# Patient Record
Sex: Female | Born: 1935 | Race: White | Hispanic: No | Smoking: Never smoker
Health system: Southern US, Community
[De-identification: ages and names within clinical notes are randomized; demographics above are authoritative.]

## PROBLEM LIST (undated history)

## (undated) DIAGNOSIS — I4891 Unspecified atrial fibrillation: Secondary | ICD-10-CM

## (undated) DIAGNOSIS — C44301 Unspecified malignant neoplasm of skin of nose: Secondary | ICD-10-CM

## (undated) DIAGNOSIS — H3530 Unspecified macular degeneration: Secondary | ICD-10-CM

## (undated) DIAGNOSIS — L309 Dermatitis, unspecified: Secondary | ICD-10-CM

## (undated) DIAGNOSIS — R609 Edema, unspecified: Secondary | ICD-10-CM

## (undated) DIAGNOSIS — E1129 Type 2 diabetes mellitus with other diabetic kidney complication: Secondary | ICD-10-CM

## (undated) DIAGNOSIS — N183 Chronic kidney disease, stage 3 unspecified: Secondary | ICD-10-CM

## (undated) DIAGNOSIS — I509 Heart failure, unspecified: Secondary | ICD-10-CM

## (undated) DIAGNOSIS — E507 Other ocular manifestations of vitamin A deficiency: Secondary | ICD-10-CM

## (undated) DIAGNOSIS — I10 Essential (primary) hypertension: Secondary | ICD-10-CM

## (undated) DIAGNOSIS — I495 Sick sinus syndrome: Secondary | ICD-10-CM

## (undated) DIAGNOSIS — E785 Hyperlipidemia, unspecified: Secondary | ICD-10-CM

## (undated) DIAGNOSIS — R682 Dry mouth, unspecified: Secondary | ICD-10-CM

## (undated) DIAGNOSIS — G319 Degenerative disease of nervous system, unspecified: Secondary | ICD-10-CM

## (undated) HISTORY — DX: Other ocular manifestations of vitamin A deficiency: E50.7

## (undated) HISTORY — DX: Essential (primary) hypertension: I10

## (undated) HISTORY — DX: Hyperlipidemia, unspecified: E78.5

## (undated) HISTORY — DX: Sick sinus syndrome: I49.5

## (undated) HISTORY — DX: Dry mouth, unspecified: R68.2

## (undated) HISTORY — DX: Unspecified macular degeneration: H35.30

## (undated) HISTORY — DX: Type 2 diabetes mellitus with other diabetic kidney complication: E11.29

## (undated) HISTORY — DX: Heart failure, unspecified: I50.9

## (undated) HISTORY — PX: EYE SURGERY: SHX253

## (undated) HISTORY — PX: PACEMAKER INSERTION: SHX728

## (undated) HISTORY — DX: Chronic kidney disease, stage 3 (moderate): N18.3

## (undated) HISTORY — DX: Edema, unspecified: R60.9

## (undated) HISTORY — DX: Dermatitis, unspecified: L30.9

## (undated) HISTORY — DX: Unspecified malignant neoplasm of skin of nose: C44.301

## (undated) HISTORY — DX: Unspecified atrial fibrillation: I48.91

## (undated) HISTORY — DX: Chronic kidney disease, stage 3 unspecified: N18.30

## (undated) HISTORY — DX: Degenerative disease of nervous system, unspecified: G31.9

---

## 1988-12-03 HISTORY — PX: KNEE SURGERY: SHX244

## 1988-12-03 HISTORY — PX: ANKLE SURGERY: SHX546

## 1998-09-27 ENCOUNTER — Other Ambulatory Visit: Admission: RE | Admit: 1998-09-27 | Discharge: 1998-09-27 | Payer: Self-pay | Admitting: *Deleted

## 2000-07-09 ENCOUNTER — Encounter: Admission: RE | Admit: 2000-07-09 | Discharge: 2000-07-09 | Payer: Self-pay | Admitting: *Deleted

## 2000-07-09 ENCOUNTER — Encounter: Payer: Self-pay | Admitting: *Deleted

## 2000-07-15 ENCOUNTER — Other Ambulatory Visit: Admission: RE | Admit: 2000-07-15 | Discharge: 2000-07-15 | Payer: Self-pay | Admitting: *Deleted

## 2000-08-07 ENCOUNTER — Ambulatory Visit (HOSPITAL_COMMUNITY): Admission: RE | Admit: 2000-08-07 | Discharge: 2000-08-07 | Payer: Self-pay | Admitting: *Deleted

## 2000-10-31 ENCOUNTER — Ambulatory Visit (HOSPITAL_COMMUNITY): Admission: RE | Admit: 2000-10-31 | Discharge: 2000-10-31 | Payer: Self-pay | Admitting: Gastroenterology

## 2003-12-04 HISTORY — PX: CARDIAC VALVE SURGERY: SHX40

## 2004-08-10 ENCOUNTER — Inpatient Hospital Stay (HOSPITAL_COMMUNITY): Admission: RE | Admit: 2004-08-10 | Discharge: 2004-08-25 | Payer: Self-pay | Admitting: Cardiology

## 2004-08-10 ENCOUNTER — Encounter (INDEPENDENT_AMBULATORY_CARE_PROVIDER_SITE_OTHER): Payer: Self-pay | Admitting: *Deleted

## 2004-08-10 ENCOUNTER — Encounter: Payer: Self-pay | Admitting: Cardiology

## 2004-08-25 ENCOUNTER — Emergency Department (HOSPITAL_COMMUNITY): Admission: EM | Admit: 2004-08-25 | Discharge: 2004-08-25 | Payer: Self-pay | Admitting: Emergency Medicine

## 2004-09-25 ENCOUNTER — Encounter
Admission: RE | Admit: 2004-09-25 | Discharge: 2004-09-25 | Payer: Self-pay | Admitting: Thoracic Surgery (Cardiothoracic Vascular Surgery)

## 2005-08-21 IMAGING — CR DG CHEST 2V
2 series · 2 of 2 positions shown · non-contrast
Comparison: none

CLINICAL DATA: CABG, follow-up. 
 TWO VIEW CHEST
 Comparison 08/17/04
 The patient is again seen to be status-post median sternotomy and aortic valve replacement. There are small bilateral effusions with lower lobe atelectasis. The upper lobes are clear. No gross pulmonary edema.  
 IMPRESSION 
 Postoperative effusions and lower lobe atelectasis, more on the left than on the right.  Allowing for technical factors, probably little change since yesterday.

[view not recorded (1 of 2)]
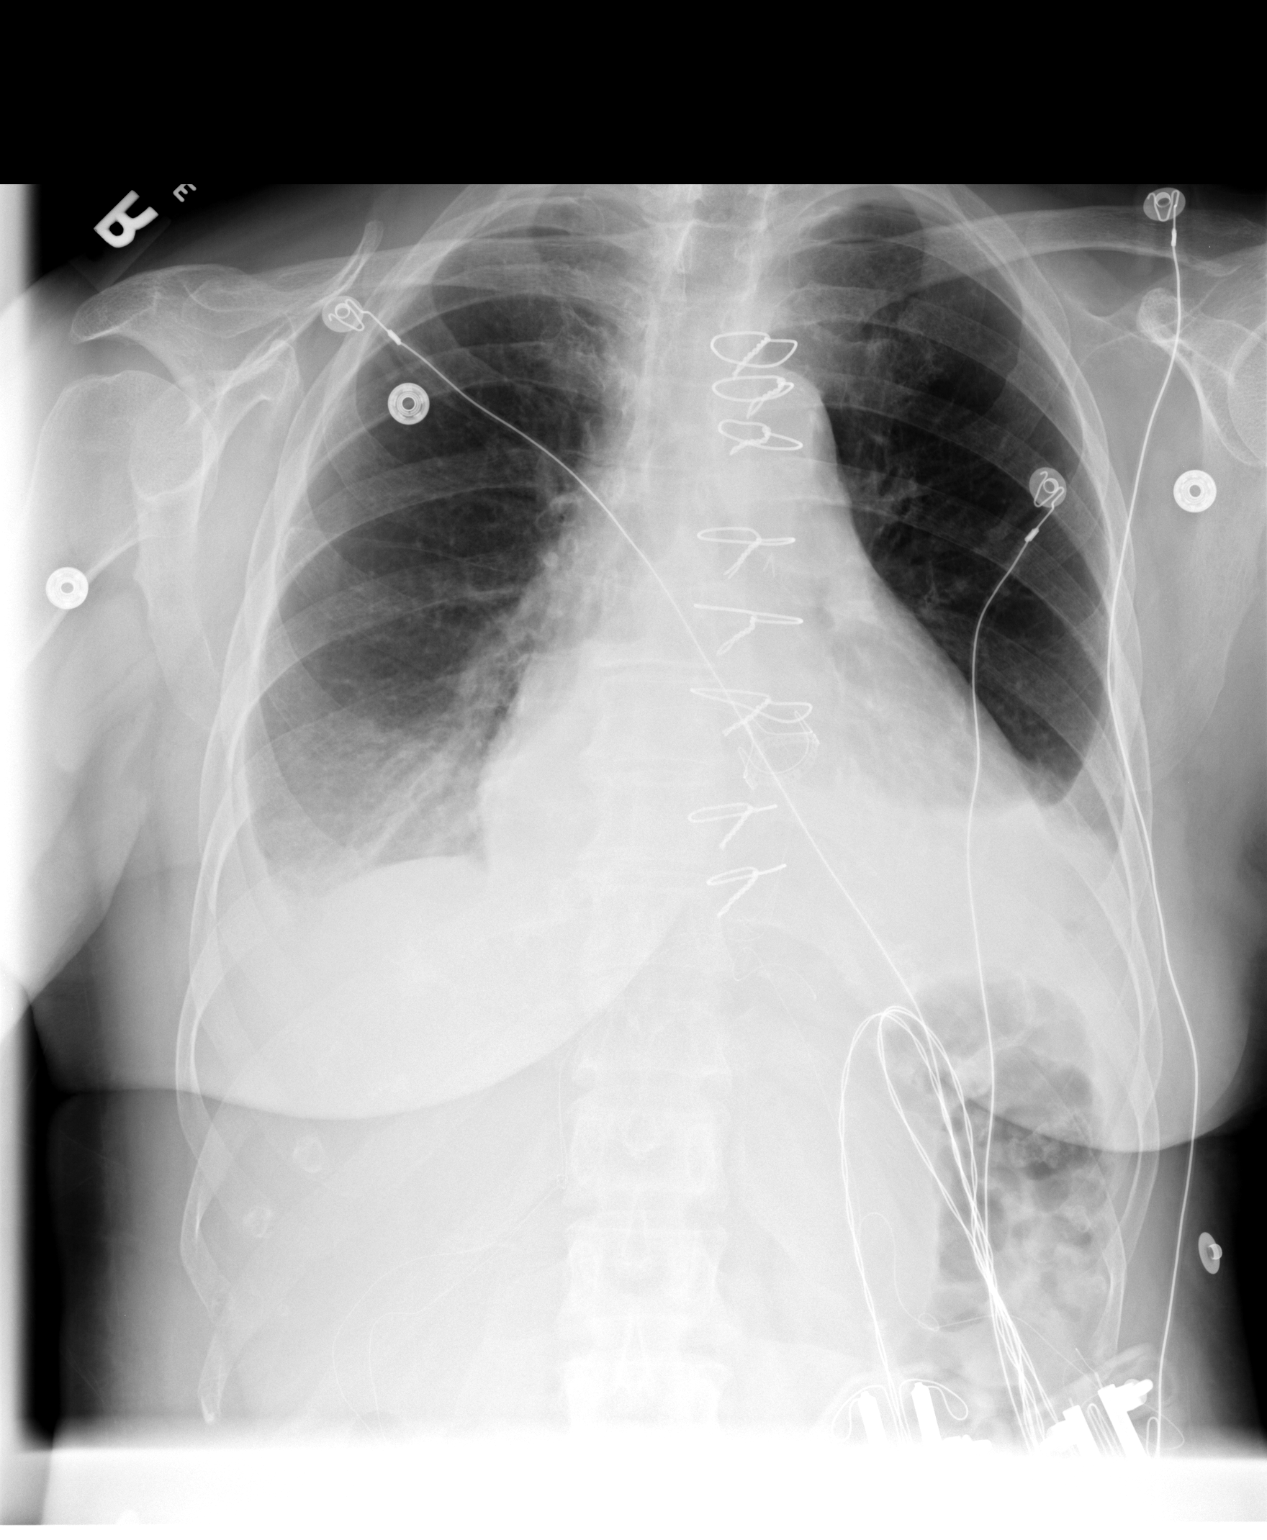

[view not recorded (2 of 2)]
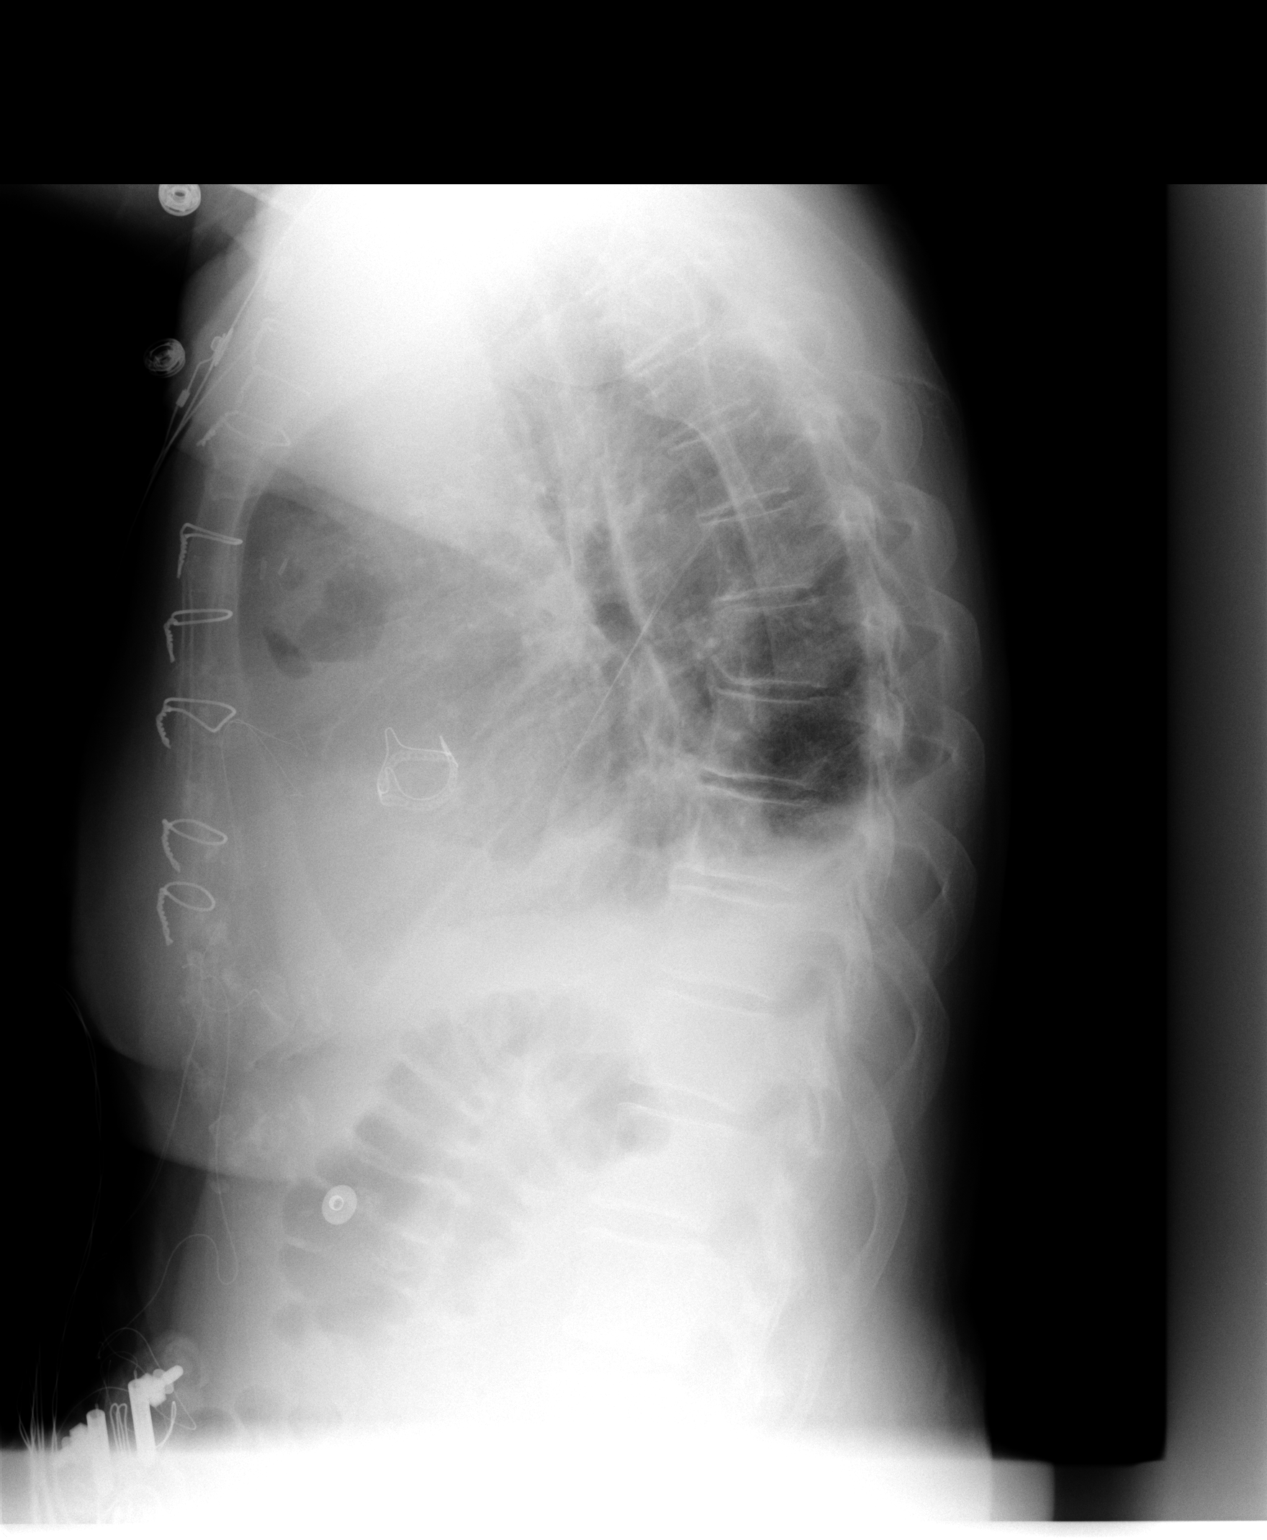

[2 of 2 positions shown; findings below may reference images not displayed]

## 2006-12-03 HISTORY — PX: COLONOSCOPY: SHX174

## 2007-12-04 ENCOUNTER — Emergency Department (HOSPITAL_COMMUNITY): Admission: EM | Admit: 2007-12-04 | Discharge: 2007-12-04 | Payer: Self-pay | Admitting: Emergency Medicine

## 2007-12-04 DIAGNOSIS — G319 Degenerative disease of nervous system, unspecified: Secondary | ICD-10-CM

## 2007-12-04 HISTORY — DX: Degenerative disease of nervous system, unspecified: G31.9

## 2009-09-16 ENCOUNTER — Encounter: Admission: RE | Admit: 2009-09-16 | Discharge: 2009-09-16 | Payer: Self-pay | Admitting: Internal Medicine

## 2010-06-12 ENCOUNTER — Encounter: Payer: Self-pay | Admitting: Internal Medicine

## 2010-07-10 ENCOUNTER — Ambulatory Visit: Payer: Self-pay | Admitting: Cardiovascular Disease

## 2010-07-24 ENCOUNTER — Ambulatory Visit: Payer: Self-pay | Admitting: Cardiology

## 2010-08-08 ENCOUNTER — Ambulatory Visit: Payer: Self-pay | Admitting: Cardiology

## 2010-09-05 ENCOUNTER — Ambulatory Visit: Payer: Self-pay | Admitting: Cardiology

## 2010-09-23 ENCOUNTER — Encounter: Payer: Self-pay | Admitting: Internal Medicine

## 2010-10-03 ENCOUNTER — Ambulatory Visit: Payer: Self-pay | Admitting: Cardiology

## 2010-11-01 ENCOUNTER — Ambulatory Visit: Payer: Self-pay | Admitting: Internal Medicine

## 2010-11-01 ENCOUNTER — Ambulatory Visit: Payer: Self-pay | Admitting: Cardiology

## 2010-11-01 DIAGNOSIS — I4891 Unspecified atrial fibrillation: Secondary | ICD-10-CM

## 2010-11-01 DIAGNOSIS — I308 Other forms of acute pericarditis: Secondary | ICD-10-CM

## 2010-11-01 DIAGNOSIS — I10 Essential (primary) hypertension: Secondary | ICD-10-CM

## 2010-11-01 DIAGNOSIS — I359 Nonrheumatic aortic valve disorder, unspecified: Secondary | ICD-10-CM | POA: Insufficient documentation

## 2010-11-01 DIAGNOSIS — I498 Other specified cardiac arrhythmias: Secondary | ICD-10-CM

## 2010-11-08 ENCOUNTER — Ambulatory Visit: Payer: Self-pay | Admitting: Cardiology

## 2010-12-03 DIAGNOSIS — C44301 Unspecified malignant neoplasm of skin of nose: Secondary | ICD-10-CM

## 2010-12-03 DIAGNOSIS — E507 Other ocular manifestations of vitamin A deficiency: Secondary | ICD-10-CM

## 2010-12-03 DIAGNOSIS — K117 Disturbances of salivary secretion: Secondary | ICD-10-CM

## 2010-12-03 DIAGNOSIS — H3530 Unspecified macular degeneration: Secondary | ICD-10-CM

## 2010-12-03 HISTORY — DX: Unspecified macular degeneration: H35.30

## 2010-12-03 HISTORY — DX: Disturbances of salivary secretion: K11.7

## 2010-12-03 HISTORY — DX: Other ocular manifestations of vitamin A deficiency: E50.7

## 2010-12-03 HISTORY — DX: Unspecified malignant neoplasm of skin of nose: C44.301

## 2010-12-14 ENCOUNTER — Ambulatory Visit: Payer: Self-pay | Admitting: Cardiology

## 2011-01-02 NOTE — Cardiovascular Report (Signed)
Summary: Office Visit   Office Visit   Imported By: Roderic Ovens 11/07/2010 10:50:06  _____________________________________________________________________  External Attachment:    Type:   Image     Comment:   External Document

## 2011-01-02 NOTE — Assessment & Plan Note (Signed)
Summary: pacer check/medtronic   Visit Type:  Follow-up   History of Present Illness: Nancy Payne is a pleasant 77 yo WF with a prior aortic valve replacement, atypical atrial flutter and atrial fibrillation, and bradycardia s/p PPM implant who presents today to establish EP care.  She has done well recently. She remains active and is doing well.  She denies symptoms of palpitations, chest pain, shortness of breath, orthopnea, PND, lower extremity edema, dizziness, presyncope, syncope, or neurologic sequela. The patient is tolerating medications without difficulties and is otherwise without complaint today.   Current Medications (verified): 1)  Metoprolol Tartrate 25 Mg Tabs (Metoprolol Tartrate) .... Take One Tablet By Mouth Twice A Day 2)  Aspirin 81 Mg Tbec (Aspirin) .... Take One Tablet By Mouth Daily 3)  Amaryl 2 Mg Tabs (Glimepiride) .... Once Daily 4)  Januvia 100 Mg Tabs (Sitagliptin Phosphate) .... 1/2 Tab Daily 5)  Warfarin Sodium 2.5 Mg Tabs (Warfarin Sodium) .... Use As Directed By Anticoagualtion Clinic 6)  Diovan Hct 320-12.5 Mg Tabs (Valsartan-Hydrochlorothiazide) .... Once Daily 7)  Lipitor 80 Mg Tabs (Atorvastatin Calcium) .... Take One Tablet By Mouth Daily.  Allergies (verified): 1)  ! * Amiordarone  Past History:  Past Medical History: persistent atrial fibrillation and atrial flutter s/p aortic valve replacement (tissue valve) bradycardia s/p PPM (MDT) HTN diabetes spinal stenosis HL    Past Surgical History: aortic valve replacement (tissue) pacemaker implant ORIF R leg  Family History: pt unaware of significant FH  Social History: Lives in [place] denies tobacco, ETOh, Drugs  Review of Systems       All systems are reviewed and negative except as listed in the HPI.   Vital Signs:  Patient profile:   77 year old female Height:      63 inches Weight:      141 pounds BMI:     25.07 Pulse rate:   91 / minute BP sitting:   120 / 80  (left  arm)  Vitals Entered By: Laurance Flatten CMA (November 01, 2010 10:44 AM)  Physical Exam  General:  Well developed, well nourished, in no acute distress. Head:  normocephalic and atraumatic Eyes:  PERRLA/EOM intact; conjunctiva and lids normal. Mouth:  Teeth, gums and palate normal. Oral mucosa normal. Neck:  supple Chest Wall:  L sided pacemaker pocket is well healed Lungs:  Clear bilaterally to auscultation and percussion. Heart:  iRRR Abdomen:  Bowel sounds positive; abdomen soft and non-tender without masses, organomegaly, or hernias noted. No hepatosplenomegaly. Msk:  Back normal, normal gait. Muscle strength and tone normal. Pulses:  pulses normal in all 4 extremities Extremities:  No clubbing or cyanosis. Neurologic:  Alert and oriented x 3.   PPM Specifications Following MD:  Hillis Range, MD     Referring MD:  Vonna Drafts PPM Vendor:  Medtronic     PPM Model Number:  [id number]     PPM Serial Number:  [id number] PPM DOI:  08/22/2004     PPM Implanting MD:  Charlynn Court  Lead 1    Location: RA     DOI: 08/22/2004     Model #: [id number]     Serial #: [id number]     Status: active Lead 2    Location: RV     DOI: 08/22/2004     Model #: [id number]     Serial #: [id number]     Status: active  Magnet Response Rate:  BOL 85 ERI 65  Indications:  Sick sinus syndrome  PPM Follow Up Battery Voltage:  2.97 V       PPM Device Measurements Atrium  Amplitude: 2.4 mV, Impedance: 528 ohms,  Right Ventricle  Amplitude: 6.5 mV, Impedance: 440 ohms, Threshold: 1.0 V at 0.4 msec  Episodes Nancy Episodes:  22     Percent Mode Switch:  100%     Coumadin:  Yes Ventricular High Rate:  5      Parameters Mode:  MVP     Lower Rate Limit:  60     Upper Rate Limit:  130 Paced AV Delay:  180     Sensed AV Delay:  150 Next Remote Date:  02/01/2011     Next Cardiology Appt Due:  10/04/2011 Tech Comments:  [facility]---PT IN AF 100% OF TIME. + COUMADIN.  EPISODES OF 1:1 SVT AT RATES 180-190 2 AND 1/2  MINUTGES MAX DURATION.  NORMAL DEVICE FUNCTION.  CHANGED RV OUTPUT FROM 2.0 TO 2.5 V. PT TO BE ENROLLED IN CARELINK.  CARELINK 02-01-11 AND ROV IN 12 MTHS W/JA. Vella Kohler  November 01, 2010 10:43 AM MD Comments:  agree  Impression & Recommendations:  Problem # 1:  ATRIAL FIBRILLATION (ICD-427.31) the patient has a history of afib and atrial flutter for which she is appropriately anticoagulated with coumadin. Therapeutic strategies for afib and atrial flutter including medicine and ablation were discussed in detail with the patient today. At this time, she has no interest in ablation and would prefer rate control.  I think that this is appropriate given her advanced age. I have increased metoprolol to 50mg  two times a day today for better rate control. Continue coumadin longterm.  Problem # 2:  ESSENTIAL HYPERTENSION, BENIGN (ICD-401.1) stable  Problem # 3:  BRADYCARDIA (ICD-427.89) normal pacemaker function no changes today  Patient Instructions: 1)  Your physician wants you to follow-up in:12 months with Dr Johney Frame   Nancy Payne will receive a reminder letter in the mail two months in advance. If you don't receive a letter, please call our office to schedule the follow-up appointment. 2)  [facility] transmission 02/01/2011 3)  Your physician has recommended you make the following change in your medication: increase Metoprolol to 50mg  two times a day Prescriptions: METOPROLOL TARTRATE 50 MG TABS (METOPROLOL TARTRATE) one by mouth bid  #60 x 11   Entered by:   Dennis Bast, RN, BSN   Authorized by:   Hillis Range, MD   Signed by:   Dennis Bast, RN, BSN on 11/01/2010   Method used:   Electronically to        [facility]. [phone]* (retail)       [address]       [place], [place]  [address]       Ph: [phone]       Fax: [phone]   RxID:   [id number]

## 2011-01-02 NOTE — Letter (Signed)
Summary: [facility]  [facility]   Imported By: Marylou Mccoy 11/07/2010 16:11:47  _____________________________________________________________________  External Attachment:    Type:   Image     Comment:   External Document

## 2011-01-02 NOTE — Miscellaneous (Signed)
Summary: Device preload  Clinical Lists Changes  Observations: Added new observation of PPM INDICATN: Sick sinus syndrome (09/23/2010 17:16) Added new observation of MAGNET RTE: BOL 85 ERI 65 (09/23/2010 17:16) Added new observation of PPMLEADSTAT2: active (09/23/2010 17:16) Added new observation of PPMLEADSER2: [id number] (09/23/2010 17:16) Added new observation of PPMLEADMOD2: 5076  (09/23/2010 17:16) Added new observation of PPMLEADDOI2: 08/22/2004  (09/23/2010 17:16) Added new observation of PPMLEADLOC2: RV  (09/23/2010 17:16) Added new observation of PPMLEADSTAT1: active  (09/23/2010 17:16) Added new observation of PPMLEADSER1: [id number]  (09/23/2010 17:16) Added new observation of PPMLEADMOD1: 5076  (09/23/2010 17:16) Added new observation of PPMLEADDOI1: 08/22/2004  (09/23/2010 17:16) Added new observation of PPMLEADLOC1: RA  (09/23/2010 17:16) Added new observation of PPM DOI: 08/22/2004  (09/23/2010 17:16) Added new observation of PPM SERL#: [id number]  (09/23/2010 17:16) Added new observation of PPM MODL#: [id number]  (09/23/2010 17:16) Added new observation of PACEMAKERMFG: Medtronic  (09/23/2010 17:16) Added new observation of PPM IMP MD: Charlynn Court  (09/23/2010 17:16) Added new observation of PPM REFER MD: Vonna Drafts  (09/23/2010 17:16) Added new observation of PACEMAKER MD: Hillis Range, MD  (09/23/2010 17:16)      PPM Specifications Following MD:  Hillis Range, MD     Referring MD:  Vonna Drafts PPM Vendor:  Medtronic     PPM Model Number:  [id number]     PPM Serial Number:  [id number] PPM DOI:  08/22/2004     PPM Implanting MD:  Charlynn Court  Lead 1    Location: RA     DOI: 08/22/2004     Model #: 1324     Serial #: [id number]     Status: active Lead 2    Location: RV     DOI: 08/22/2004     Model #: [id number]     Serial #: [id number]     Status: active  Magnet Response Rate:  BOL 85 ERI 65  Indications:  Sick sinus syndrome

## 2011-01-10 ENCOUNTER — Encounter (INDEPENDENT_AMBULATORY_CARE_PROVIDER_SITE_OTHER): Payer: Medicare Other

## 2011-01-10 DIAGNOSIS — Z7901 Long term (current) use of anticoagulants: Secondary | ICD-10-CM

## 2011-01-10 DIAGNOSIS — I4892 Unspecified atrial flutter: Secondary | ICD-10-CM

## 2011-01-24 ENCOUNTER — Other Ambulatory Visit (INDEPENDENT_AMBULATORY_CARE_PROVIDER_SITE_OTHER): Payer: Medicare Other

## 2011-01-24 DIAGNOSIS — I4891 Unspecified atrial fibrillation: Secondary | ICD-10-CM

## 2011-01-24 DIAGNOSIS — Z7901 Long term (current) use of anticoagulants: Secondary | ICD-10-CM

## 2011-02-01 ENCOUNTER — Encounter (INDEPENDENT_AMBULATORY_CARE_PROVIDER_SITE_OTHER): Payer: Medicare Other

## 2011-02-01 ENCOUNTER — Encounter: Payer: Self-pay | Admitting: Internal Medicine

## 2011-02-01 DIAGNOSIS — I495 Sick sinus syndrome: Secondary | ICD-10-CM

## 2011-02-08 ENCOUNTER — Encounter (INDEPENDENT_AMBULATORY_CARE_PROVIDER_SITE_OTHER): Payer: Medicare Other

## 2011-02-08 DIAGNOSIS — Z7901 Long term (current) use of anticoagulants: Secondary | ICD-10-CM

## 2011-02-08 DIAGNOSIS — I4891 Unspecified atrial fibrillation: Secondary | ICD-10-CM

## 2011-02-19 ENCOUNTER — Encounter: Payer: Self-pay | Admitting: *Deleted

## 2011-03-01 NOTE — Cardiovascular Report (Signed)
Summary: Office Visit   Office Visit   Imported By: Roderic Ovens 02/20/2011 14:30:32  _____________________________________________________________________  External Attachment:    Type:   Image     Comment:   External Document

## 2011-03-01 NOTE — Letter (Signed)
Summary: Remote Device Check  [facility], Main Office  [address]   [place], [place] [address]   Phone: [phone]  Fax: [phone]     February 19, 2011 MRN: [id number]   NEVADA KIRCHNER [address] [place], [place]  [address]   Dear Ms. Menger,   Your remote transmission was recieved and reviewed by your physician.  All diagnostics were within normal limits for you.  __X___Your next transmission is scheduled for: 05-03-2011.  Please transmit at any time this day.  If you have a wireless device your transmission will be sent automatically.   Sincerely,  Vella Kohler

## 2011-03-07 ENCOUNTER — Ambulatory Visit (INDEPENDENT_AMBULATORY_CARE_PROVIDER_SITE_OTHER): Payer: Medicare Other | Admitting: *Deleted

## 2011-03-07 DIAGNOSIS — I4891 Unspecified atrial fibrillation: Secondary | ICD-10-CM

## 2011-03-07 DIAGNOSIS — Z7901 Long term (current) use of anticoagulants: Secondary | ICD-10-CM

## 2011-04-04 ENCOUNTER — Ambulatory Visit (INDEPENDENT_AMBULATORY_CARE_PROVIDER_SITE_OTHER): Payer: Medicare Other | Admitting: *Deleted

## 2011-04-04 DIAGNOSIS — I4891 Unspecified atrial fibrillation: Secondary | ICD-10-CM

## 2011-04-16 ENCOUNTER — Encounter: Payer: Self-pay | Admitting: Family Medicine

## 2011-04-20 NOTE — H&P (Signed)
NAME:  Nancy Payne, Nancy Payne NO.:  [id number]   MEDICAL RECORD NO.:  [id number]                   PATIENT TYPE:  OIB   LOCATION:                                       FACILITY:  [facility]   PHYSICIAN:  Peter M. Swaziland, M.D.               DATE OF BIRTH:  08/17/1922   DATE OF ADMISSION:  08/10/2004  DATE OF DISCHARGE:                                HISTORY & PHYSICAL   Nancy Payne is a pleasant 77 year old white female who is being evaluated for  critical aortic stenosis.  The patient has had a long history of murmur and  had had echocardiograms in the past but the last one up until recently was  four years ago.  More recently, an echocardiogram was obtained, on May 11, 2004, and this demonstrated critical aortic stenosis with a peak  instantaneous gradient of 115-mmHg, mean gradient of 61-mmHg, and aortic  valve area of 0.3-cm2.  Left ventricular function was normal with moderate  concentric LVH.  She had moderate mitral __________  calcification and mild  mitral insufficiency.  The patient denies any cardiac symptoms, specifically  denied any chest pain, shortness of breath, palpitations, dizziness, or  syncope.  She has had no palpitations.  She has been very active and enjoys  walking and playing golf.  The patient had initially been seen in June but  her evaluation was delayed due to her husband undergoing emergency surgery  for abdominal aortic aneurysm.  He is now recovered, and she is willing to  proceed with her cardiac evaluation and treatment.   PAST MEDICAL HISTORY:  1.  Spinal stenosis.  2.  Hypercholesterolemia.  3.  Remote fracture of her right leg and has a rod in her lower leg.   PAST SURGICAL HISTORY:  1.  Tonsillectomy.  2.  Removal of an ovarian cyst.   ALLERGIES:  PENICILLIN.   CURRENT MEDICATIONS:  1.  Lipitor 20 mg per day.  2.  Aspirin 325 mg per day.  3.  Atenolol 100 mg daily.  4.  HCTZ 25 mg per day.   SOCIAL HISTORY:  The  patient is married.  She denies tobacco or alcohol use.  She has three children.   FAMILY HISTORY:  Father died at age 83 of unknown causes.  Mother died at  age 59 with a stroke.  One brother died with colon cancer and one died in a  motor vehicle accident.  One sister died with a brain aneurysm.  One brother  is alive and well.   REVIEW OF SYSTEMS:  Otherwise negative.   PHYSICAL EXAMINATION:  GENERAL:  The patient is a pleasant white female in  no distress.  VITAL SIGNS:  Weight is 133, blood pressure 160/100, pulse 80 and regular.  Her respirations are 18 and normal.  HEENT:  Pupils equal, round, reactive to light and accommodation.  Extraocular movements are  full.  Oropharynx is clear.  NECK:  Without JVD, adenopathy, or thyromegaly.  Carotid upstrokes are  markedly diminished and delayed with radiated murmur.  LUNGS:  Clear.  CARDIAC:  Reveals a harsh grade 3/6 systolic murmur heard in the aortic area  radiating to the carotids and to the apex.  S2 is muffled.  There is no  thrill.  There is no diastolic murmur.  ABDOMEN:  Soft and nontender without masses or bruits.  EXTREMITIES:  Without edema.  Pulses are 2+ and symmetric.  NEUROLOGIC:  Nonfocal.   LABORATORY DATA:  ECG shows a normal sinus rhythm.  There is LVH with  repolarization abnormality.   Chest x-ray shows some cardiomegaly, otherwise no active disease.   Coags are normal.  Sodium 142, potassium 4.2, chloride 102, CO2 33, BUN 21,  creatinine 1.1, glucose of 116.  Liver function studies are normal.  Cholesterol is 170, triglycerides 88, HDL 63, LDL of 89.  CBC is normal.   IMPRESSION:  1.  Critical aortic stenosis.  2.  Hypertension.  3.  History of hypercholesterolemia.  4.  History of spinal stenosis.   PLAN:  We will proceed with right and left heart catheterization coronary  angiography.  Once her diagnostic evaluation is complete, we will refer to  Dr. Tyrone Sage for consideration of aortic valve  replacement.                                                Peter M. Swaziland, M.D.    PMJ/MEDQ  D:  08/04/2004  T:  08/04/2004  Job:  [id number]   cc:   Gwenith Daily. Tyrone Sage, M.D.  [address]  [place]  [place] [address]   Wilson Singer, M.D.  [phone] [address]  [place]  [place] [address]  Fax: [phone]

## 2011-04-20 NOTE — Cardiovascular Report (Signed)
Nancy Payne, GILLHAM NO.:  [id number]   MEDICAL RECORD NO.:  [id number]          PATIENT TYPE:  INP   LOCATION:  2034                         FACILITY:  [facility]   PHYSICIAN:  Elmore Guise., M.D.DATE OF BIRTH:  Apr 07, 1922   DATE OF PROCEDURE:  08/22/2004  DATE OF DISCHARGE:                              CARDIAC CATHETERIZATION   PROCEDURE:  Dual chamber permanent pacemaker placement.   INDICATIONS:  Significant sinus pause, sick sinus syndrome.   PROCEDURE:  Patient came to the cardiac catheterization laboratory after  appropriate informed consent.  She was prepped and draped in sterile  fashion.  Approximately a 2 inch incision was made at the left delta  pectoral groove.  A subcutaneous pocket was made without difficulty.  Two  venous access sticks into the left axillary/left subclavian vein were  performed under fluoroscopic guidance.  Ventricular lead was placed without  difficulty.  The Medtronic active fixation 5076/52 cm lead, serial  #[id number], was placed in the ventricle with the following thresholds.  Threshold was 0.7 volts with a pulse width of 0.5 milliseconds.  Current was  1.1 milliamps, impedance of 854 ohms with R-waves measuring 12.4 millivolts.  An atrial lead was then placed a Medtronic 5076/45 cm serial #[id number]  active fixation lead was placed in the atrium with atrial fibrillation waves  of 2.0 and an impedance of 663 ohms.  An EnRhythm dual chamber pacemaker was  then attached to the atrial and ventricular leads.  Pacemaker is [id number],  serial #[id number].  Pocket was irrigated with kanamycin solution.  Pacemaker was sewn into pocket.  Pocket was closed with three layers of  continuous sutures with the bottom layer being a 2-0 Vicryl followed by a 2-  0 Vicryl with completion with a 4-0 Vicryl for subcuticular layer.  Patient  tolerated procedure well.  No apparent complications.  She will be  discharged from the cardiac  catheterization laboratory in stable condition.       TWK/MEDQ  D:  08/22/2004  T:  08/23/2004  Job:  [id number]

## 2011-04-20 NOTE — Cardiovascular Report (Signed)
NAME:  Nancy Payne, Nancy Payne NO.:  [id number]   MEDICAL RECORD NO.:  [id number]                   PATIENT TYPE:  OIB   LOCATION:  2302                                 FACILITY:  [facility]   PHYSICIAN:  Peter M. Swaziland, M.D.               DATE OF BIRTH:  19-Jan-1922   DATE OF PROCEDURE:  08/10/2004  DATE OF DISCHARGE:                              CARDIAC CATHETERIZATION   INDICATION FOR PROCEDURE:  The patient is a 77 year old white female who is  evaluated for critical aortic stenosis.  This was demonstrated by exam and  echocardiogram.  Cardiac catheterization was performed for preoperative  evaluation.   ACCESS:  Via the right femoral artery and vein via the standard Seldinger  technique.   EQUIPMENT:  6 French 4 cm right and left Judkins catheter, 6 French pigtail  catheter, 6 French left Amplatz 1 catheter, 7 French arterial sheath, 8  French venous sheath and 7 French balloon-tipped Swan-Ganz catheter.   MEDICATIONS:  Local anesthesia with 1% Xylocaine.  The patient was started  on IV dopamine drip during the procedure for hypotension.  She was given  Zofran 4 mg IV.   HEMODYNAMIC DATA:  Right atrial pressure 9/6 with mean of 5 mmHg.  Right  ventricular pressure is 52 with EDP of 7 mmHg.  Pulmonary artery pressure is  50/15 with mean of 31 mmHg.  Pulmonary capillary wedge pressure was 17/25  with mean of 15 mmHg.  On simultaneous pressures there was no significant  mitral valve gradient.  There was a somewhat prominent V wave.  Simultaneous  aortic and left ventricular pressures showed a left ventricular pressure of  210 with EDP of 16 mmHg.  Aortic pressure 109/42 with mean 68 mmHg.  Aortic  valve peak gradient is 101 mmHg with a mean gradient to 66 mHg.  Aortic  valve area is calculated at 0.36 sq cm.  Cardiac output by Fick  determination is 2.4 liters/minute with index of 1.5.  By thermodilution  cardiac output was 3.1 with index of 1.9.   ANGIOGRAPHIC DATA:  Left ventricular angiography was performed.  This  demonstrated normal left ventricular size and contractility.  There was also  prompt extravasation of dye outside the ventricular cavity that appeared to  be in the pericardial space, but also promptly opacified the coronary veins.  Catheter was withdrawn at this point.  Subsequent coronary angiography  showed normal coronary anatomy.  The left main coronary artery was normal.   The left anterior descending artery and its branches were normal.   The right coronary artery was the dominant vessel and appeared normal.   Complication during this procedure was acute myocardial perforation.  This  was felt to occur when crossing the aortic valve with a straight wire.  The  wire crossed relatively easy across the valve, but was directed straight  down and posteriorly.  Initially, the pigtail catheter appeared  to hang up  on the posterior wall of the ventricle, but when the straight wire was  exchanged for a J wire the pigtail appeared to be directed upward into the  left ventricle.  Excellent pressure tracings were obtained showing marked  gradient across the aortic valve, normal filling pressures.  However, when  we performed ventricular angiography it was clear that there was  extravasation to extraventricular space.  On subsequent fluoroscopy, this  dye appeared to diffuse into the pericardial sac.  The patient did  experience transient chest pain after the initial left ventricular  angiography, but her pain subsequently subsided.  She remained  hemodynamically stable for approximately the next 20-30 minutes.  Her oxygen  saturations were good and she denied any shortness of breath.  A stat  echocardiogram was obtained which initially showed only a scant pericardial  effusion.  However, as we continued to observe the patient the pericardial  effusion increased in size and she became hemodynamically unstable.  CVTS   consult was obtained  with Dr. Cornelius Moras and we subsequently performed emergent  pericardial tap via subxiphoid approach with placement of pericardial  catheter.  This resulted in prompt improvement in her hemodynamic status.  Plans were made at the end of the case for the patient to be taken for  emergent aortic valve replacement and repair of the pericardial leak.   FINAL INTERPRETATION:  1.  Normal coronary anatomy.  2.  Normal left ventricular function.  3.  Critical aortic stenosis.  4.  Acute myocardial perforation probably related to guide wire perforation      with cardiac tamponade.  This resolved with pericardial tap.                                               Peter M. Swaziland, M.D.    PMJ/MEDQ  D:  08/10/2004  T:  08/11/2004  Job:  [id number]   cc:   Wilson Singer, M.D.  [address]  [place]  [place] [address]  Fax: [phone]   Salvatore Decent. Cornelius Moras, M.D.  [address]  [place]  [place] [address]

## 2011-04-20 NOTE — Discharge Summary (Signed)
Nancy Payne, AUNE NO.:  [id number]   MEDICAL RECORD NO.:  [id number]          PATIENT TYPE:  INP   LOCATION:  2034                         FACILITY:  [facility]   PHYSICIAN:  Salvatore Decent. Cornelius Moras, M.D. DATE OF BIRTH:  06-Oct-1922   DATE OF ADMISSION:  08/10/2004  DATE OF DISCHARGE:  08/25/2004                                 DISCHARGE SUMMARY   CARDIOLOGIST:  Dr. Peter Swaziland   PRIMARY CARE PHYSICIAN:  Dr. Karilyn Cota   ADMITTING DIAGNOSES:  1.  Acute pericardial tamponade with presumed perforation of the left      ventricle.  2.  Critical aortic stenosis.   DISCHARGE/SECONDARY DIAGNOSES:  1.  Acute pericardial tamponade with perforation of the left ventricle      status post repair.  2.  Critical aortic stenosis status post aortic valve replacement      (pericardial tissue valve).  3.  Aneurysmal dilatation of the ascending thoracic aorta status post repair      and grafting.  4.  Postoperative atrial fibrillation with rapid ventricular response now      with controlled rate on amiodarone, Lopressor, digoxin, and Coumadin      therapy.  5.  Postoperative significant sinus pause/sick sinus syndrome.  Status post      dual chamber pacemaker.  6.  Newly diagnosed diabetes mellitus type 2.  Last hemoglobin A1C 6.1.  7.  Fluid volume excess improving with diuretic therapy.  8.  Right cerumen impaction improving with Debrox.   PROCEDURES:  1.  Cardiac catheterization by Dr. Swaziland complicated by presumed      perforation of the left ventricle via guidewire.  Date was August 10, 2004.  2.  Emergent median sternotomy for repair of the left ventricular      perforation, aortic valve replacement using a 21 mm pericardial tissue      valve, and supracoronary straight graft resection and grafting of the      proximal ascending thoracic aorta by Dr. Tressie Stalker on August 10, 2004.  3.  Placement of a dual chamber permanent pacemaker by Dr. Lady Deutscher on      August 22, 2004.   BRIEF HISTORY:  Ms. Glasco is a 77 year old Caucasian female who underwent  elective left heart catheterization on the afternoon of August 10, 2004  for known history of critical aortic stenosis.  This procedure was  complicated by perforation of the left ventricle presumably by guidewire  utilized to pass a catheter across the aorta valve into the left ventricular  chamber.  The patient developed pericardial tamponade in the catheterization  laboratory requiring pericardial drain placement under ultrasound guidance.  Dr. Cornelius Moras from [facility] was then consulted emergently for cardiac surgical  repair.   HOSPITAL COURSE:  Ms. Linebaugh was initially admitted for elective cardiac  catheterization on August 10, 2004.  However, this was complicated by left  ventricle perforation and Dr. Tressie Stalker was consulted for emergent  cardiac surgery repair.  After discussing risks, benefits, and alternatives  with patient and her husband she  did agree to proceed and was taken  emergently to the operating room.  Of note, her cardiac catheterization  report showed normal coronary anatomy, normal left ventricular function,  critical aortic stenosis, and left ventricle perforation as discussed above.   Once in the operating room Dr. Cornelius Moras found a large tear measuring in excess  of 6 cm in length along the lateral wall of the left ventricle which had had  appearance of dissected perforation related to bleeding through the left  ventricle muscle.  There was also critical aortic stenosis with bicuspid  aortic valve and severe calcification within aortic valve and aortic  annulus.  There was normal left ventricular systolic function with a  moderate left ventricular hypertrophy.  There was also aneurysm dilatation  of the proximal ascending thoracic aorta measuring greater than 5.2 cm in  diameter.  She did undergo repair of her left ventricle as well as aortic  valve replacement and  repair and grafting of her ascending thoracic aorta  aneurysm.  Overall, Ms. Vent tolerated the procedure well and was  transferred from the operating room to the surgical intensive care unit in  stable condition.  She was transfused 3 units of packed red blood cells  prior to coming off coronary pulmonary bypass.   On the morning of postoperative day one Ms. Dewing had been extubated and was  neurologically intact.  She was in normal sinus rhythm and hemodynamically  stable.  Chest tube output was low and urine output was adequate.  Postoperative laboratories were also stable other than a hypokalemia which  was replaced.  Postoperative weight did show some fluid volume excess and  she was started on diuretic therapy.   Over the next several days Ms. Bacallao did well.  She remained afebrile.  By  postoperative day three we were able to begin weaning of dopamine.  She  remained on diuretic therapy.  Laboratories remained stable.  Chest tube  output also remained low and her chest tubes were discontinued without  incident.  She remained in sinus rhythm, although EKG showed a new left  bundle branch block.   On postoperative day four Ms. Seman did develop atrial fibrillation and was  started on IV amiodarone.  Initially, she converted back to sinus rhythm;  however, her heart rate then decreased to the 70s and she did require atrial  pacing.  Over the next few days she did go in and out of atrial  fibrillation.  She was restarted on amiodarone and started on a low dose  beta blocker.  It was felt with her intermittent atrial fibrillation that  she should be started on Coumadin therapy for anticoagulation.  Otherwise,  Ms. Bouchillon was making good progress, although a little slow with  mobilization.  By postoperative day six she was felt stable to transfer out  of the intensive care unit onto the floor.  Once on the floor Ms. Carbary continued to make progress; however, she did develop persistent  atrial  fibrillation.  At times her rate was up to 120.  For this reason, her  cardiologist did increase her Lopressor eventually to 50 mg b.i.d.  Her  blood pressure appeared to be tolerating this well.  She was changed to oral  amiodarone.  She continued on anticoagulation therapy.  She remained with  significant weight increased from her baseline which eventually did require  an increase in her diuretic therapy to Lasix b.i.d.  She was showing good  response on this regimen.  Once her pedal edema began to resolve she was  able to make better progress with mobilization with assistance of cardiac  rehabilitation.  Otherwise, she was felt to be making good progress.  However, on August 20, 2004 she was sitting in a chair visiting with  family.  During morning rounds she was noted to have a 5-10 second episode  of generalized seizure followed by brief episode of syncope.  She did awaken  spontaneously with no recollection of the previous events.  Telemetry showed  an approximately 10 second pause which then reconverted to atrial  fibrillation.  She awoke neurologically intact.  Her blood pressure was  stable.  Based on this episode, she was placed on external pacemaker with  BVI pacing at 60.  It was felt that based on this event she would most  likely need a permanent pacemaker.  For this reason, her Coumadin was placed  on hold.  In addition, her amiodarone was discontinued and her Lopressor was  decreased to 25 mg b.i.d.   She was reevaluated by cardiology on August 21, 2004.  It was Dr.  Elvis Coil opinion that she would benefit from a permanent pacemaker.  This  was scheduled tentatively for September 21 to give her INR a chance to  become less than 2.0.  Over those next few days she had no other arrhythmias  other than her persistent atrial fibrillation.  On August 23, 2004 Ms.  Mitchell did undergo permanent pacemaker placement without complication.  The  following day she was  restarted on anticoagulation therapy.  After her  pacemaker evaluation showed normal limits her external pacing wires were  also discontinued.   Also of note, during Ms. Ironside's hospitalization she was noted to have  elevated blood sugars as high as into the low 200s.  She initially was  covered with sliding scale insulin.  A hemoglobin A1C was ordered which was  slightly elevated at 6.1.  She initially was started on Amaryl 2 mg once a  day by Dr. Swaziland.  She did show some improvement with her blood sugars now  only about as high as 150.  However, this was still felt suboptimal and her  Amaryl was increased to 4 mg daily.  She was also changed to a carbohydrate  modified diet and a nutrition consult was ordered for diabetic nutritional  teaching.  The patient was also educated on self blood glucose monitoring.   By August 24, 2004 Ms. Bobe was status post her permanent pacemaker. She remained in atrial fibrillation, but was now better rate controlled in  the 70s-90s.  By this point digoxin had been added to her regimen of  amiodarone and Lopressor.  Her INR was still subtherapeutic at 1.5.  However, it was not felt that she would need to be therapeutic for her to be  discharged home.  She did have several instances of hypokalemia which did  require replacement.  Otherwise, her laboratories remained stable.  She  continued to have good diuresis with decreasing pedal edema.  It was felt  that her Lasix could now be decreased to once a day.  Otherwise, she was  felt to be doing well.  She was tolerating oral diet.  Her bowel and bladder  function were working appropriately.  Her incisions were healing well  without signs of infection.  Her lungs were clear.  She had been weaned from  supplemental oxygen and was saturating greater than 90%.  Her blood pressure  was  stable at 123/71.  Her heart had a regular rate and rhythm without  murmur.  Her abdominal examination was benign and her  extremities showed  decreasing edema now only at 1+.  She was also making progress with cardiac  rehabilitation, although she did require use of a rolling walker.  She did  show steady improvement in her distance.  However, it was felt that she  would require home health physical therapy and occupational therapy after  discharge.  It was also felt she would benefit from a home health nurse for  continued diabetes teaching, INR laboratory draws, and wound and vital sign  assessment.   It is anticipated that she will be discharged home on August 25, 2004.  Official discharge orders to be written if no change in the patient's status  during morning rounds.   LABORATORY DATA:  Recent laboratories show an INR of 1.5.  Sodium 137,  potassium 3.3 which was replaced, blood glucose 191, BUN 28, creatinine 1.5.  Hemoglobin A1C 6.1.  White blood count 10.5, hemoglobin 12.3, hematocrit  36.1, platelet count 132.  Total bilirubin 0.7, alkaline phosphatase 46,  SGOT 22, SGPT 22, total protein 4.9, albumin 3.0.   Radiology:  Chest x-ray on August 19, 2004 showed stable bilateral  effusions and atelectasis left greater than the right.  The heart was  enlarged.   DISCHARGE MEDICATIONS:  1.  Enteric-coated aspirin 81 mg one p.o. daily.  2.  Metoprolol 25 mg one p.o. b.i.d.  3.  Lipitor 20 mg one p.o. daily.  4.  Digoxin 0.125 mg p.o. daily.  5.  Amaryl 4 mg p.o. daily.  6.  Lasix 40 mg p.o. daily x1 month, then as directed by Dr. Swaziland.  7.  K-Dur 20 mEq p.o. daily x1 month, then as directed by Dr. Swaziland.  8.  Coumadin.  Final dose to be determined before discharge based on morning      INR results.  9.  Amiodarone 200 mg two tablets b.i.d. x6 days, then decrease to two      tablets p.o. daily.  10. She is instructed to hold her home regimen of hydrochlorothiazide until      further instructed by Dr. Swaziland.  11. Tylox one to two tablets p.o. q.4-6h. p.r.n. pain.  DISCHARGE ACTIVITY:   She is instructed to avoid driving and heavy lifting  more than 10 pounds.  Home health physical and occupational therapist  through [facility] has been arranged.  A rolling walker was  delivered to her room prior to discharge.   DISCHARGE DIET:  She is to follow a low fat, low salt, carbohydrate modified  diet.  She is instructed to check her blood sugars every morning before  breakfast and to record these results.  She is to take this record to her  follow-up appointment with Dr. Karilyn Cota.   WOUND CARE:  She may shower and clean her incisions daily with mild soap and  water.  She is to notify the [facility] office if she develops fever greater than  101 or redness or drainage from her incision site.   FOLLOWUP:  1.  She is to follow up with Dr. Tressie Stalker at the [facility] office on September 25, 2004 at 12:15 p.m.  2.  She is to call [phone] to arrange two-week follow-up with Dr. Peter      Swaziland.  She is to get a chest x-ray taken at this appointment and was  instructed to bring this chest x-ray film with her to her appointment      with Dr. Cornelius Moras.  3.  She is to call and schedule two to three-week follow-up with Dr. Karilyn Cota      to discuss her diabetes management.  4.  An [facility] home health nurse will draw laboratories for a PT/INR.      Results to Dr. Elvis Coil office.       AWZ/MEDQ  D:  08/24/2004  T:  08/25/2004  Job:  [id number]   cc:   Peter M. Swaziland, M.D.  [address], [address]  [place], [place] [address]  Fax: [phone]   Elmore Guise., M.D.  [address]  [place], [place] [address]  Fax: [phone]   Wilson Singer, M.D.  [address]  [place]  [place] [address]  Fax: [phone]

## 2011-04-20 NOTE — Consult Note (Signed)
NAME:  Nancy Payne, Nancy Payne                         ACCOUNT NO.:  [id number]   MEDICAL RECORD NO.:  [id number]                   PATIENT TYPE:  OIB   LOCATION:  2302                                 FACILITY:  [facility]   PHYSICIAN:  Salvatore Decent. Cornelius Moras, M.D.              DATE OF BIRTH:  01/18/1922   DATE OF CONSULTATION:  08/10/2004  DATE OF DISCHARGE:                                   CONSULTATION   REQUESTING PHYSICIAN:  Dr. Peter Swaziland   PRIMARY CARE PHYSICIAN:  Dr. Lilly Cove   REASON FOR CONSULTATION:  Acute pericardial tamponade.   HISTORY OF PRESENT ILLNESS:  Patient is a 77 year old female from [place]  with known history of aortic stenosis, hypertension, hyperlipidemia, and  spinal stenosis.  The patient has been in her usual state of health, but  recently underwent a follow-up echocardiogram for known history of aortic  stenosis that demonstrated critical aortic stenosis with peak and mean  transvalvular gradients of 115 and 61 mmHg, respectively.  Her estimated  aortic valve area was 0.3 sq cm.  She was referred to Dr. Swaziland and  subsequently was brought in for elective left and right heart  catheterization today.  Left and right heart catheterization was performed  confirming the presence of critical aortic stenosis with normal left  ventricular systolic function and normal coronary artery anatomy with no  significant coronary artery disease.  However, following direct measurement  of transvalvular aortic gradient, left ventriculogram was performed and one  could appreciate evidence of staining into the pericardial space suggestive  of acute perforation of left ventricle.  The patient subsequently developed  progressive hypotension while she remained in the catheterization laboratory  consistent with pericardial tamponade.  2-D echocardiogram performed in the  catheterization laboratory confirms large pericardial effusion.  Emergency  cardiac surgical consultation was  requested.   The patient is briefly examined in the catheterization laboratory table and  brief review of systems is performed.  The patient has been feeling well up  until the time of catheterization and specifically denies any problems with  significant shortness of breath, chest pain, palpitations, or syncope.  She  has had good appetite and reports normal bowel function.  She has not had  any fevers or chills recently.  She denies problems with arthritis or  arthralgias.  She reports that she has been doing remarkably well otherwise.   PAST MEDICAL HISTORY:  Briefly reviewed and notable for history of aortic  stenosis, hypertension, hyperlipidemia, and spinal stenosis.   MEDICATIONS ON ADMISSION:  1.  Aspirin.  2.  Lipitor.   ALLERGIES:  PENICILLIN causes rash.   SOCIAL HISTORY:  The patient is married and lives with her husband here in  [place].  Her husband is recovering from emergency abdominal aortic  aneurysm repair.  She is an avid golfer and remains active physically.  She  is a nonsmoker and denies excessive alcohol consumption.  PHYSICAL EXAMINATION:  Performed briefly in the cardiac catheterization  laboratory on the table.  VITAL SIGNS:  Patient is in sinus rhythm with blood pressure initially about  100/50 systolic.  LUNGS:  She is breathing comfortably.  Breath sounds are clear to  auscultation anteriorly.  CARDIAC:  She has a prominent cardiac murmur.  EXTREMITIES:  The patient has a sheath in her groin.  The remainder of her  physical examination cannot be completed under the circumstances.   DIAGNOSTIC TESTS:  Cardiac catheterization performed by Dr. Swaziland is  reviewed and findings are as described previously.  There is obvious  evidence of perforation that one can appreciate during left ventriculogram  injection contrast extending through the myocardium into the pericardial  space.  There is also contrast that opacifies some of the arterial and   cardiac venous system during the left ventriculogram injection as well.  Aortic root injection was not performed.  Coronary artery anatomy appears  normal with no significant coronary artery disease.   During consultation the patient developed continued gradual progression of  hypotension prompting percutaneous pericardiocentesis performed by Dr.  Swaziland in the catheterization laboratory by ultrasound guidance.  Frank  blood was aspirated from the pericardial space and this immediately relieved  the patient's tamponade with restoration of normal systolic blood pressure.   IMPRESSION:  Acute pericardial tamponade related to perforation of the left  ventricle, presumably due to guidewire utilized for crossing the aortic  valve for measurement of transvalvular aortic gradient.  The patient has  critical aortic stenosis with normal left ventricular systolic function and  normal coronary artery anatomy and no significant coronary artery disease.  She needs emergent surgical intervention for both repair of the presumed  left ventricular perforation as well as aortic valve replacement.   PLAN:  I have discussed issues with the patient here in the catheterization  laboratory table and also with the patient's husband.  They understand the  emergent nature of the situation with need for proceeding with surgery as  described.  All their questions have been addressed.  A total of 45 minutes  of time was spent in the catheterization laboratory evaluating the situation  and making arrangements to proceed with surgery immediately.                                               Salvatore Decent. Cornelius Moras, M.D.    CHO/MEDQ  D:  08/10/2004  T:  08/11/2004  Job:  [id number]   cc:   Wilson Singer, M.D.  [address]  [place]  [place] [address]  Fax: [phone]

## 2011-04-20 NOTE — Op Note (Signed)
NAME:  Nancy Payne, Nancy Payne                         ACCOUNT NO.:  [id number]   MEDICAL RECORD NO.:  [id number]                   PATIENT TYPE:  OIB   LOCATION:  2302                                 FACILITY:  [facility]   PHYSICIAN:  Salvatore Decent. Cornelius Moras, M.D.              DATE OF BIRTH:  1922-02-19   DATE OF PROCEDURE:  08/10/2004  DATE OF DISCHARGE:                                 OPERATIVE REPORT   PREOPERATIVE DIAGNOSES:  1.  Acute pericardial tamponade with presumed perforation of the left      ventricle.  2.  Critical aortic stenosis.   POSTOPERATIVE DIAGNOSES:  1.  Acute pericardial tamponade with presumed perforation of the left      ventricle.  2.  Critical aortic stenosis.  3.  Aneurysmal dilatation of the ascending thoracic aorta.   PROCEDURE:  Median sternotomy for repair of left ventricular perforation,  aortic valve replacement (21 mm pericardial tissue valve) and supracoronary  straight graft resection and grafting of the proximal ascending thoracic  aorta.   SURGEON:  Dr. Purcell Nails.   ASSISTANT:  Ms. Toniann Fail.   ANESTHESIA:  General.   BRIEF CLINICAL NOTE:  Patient is a 77 year old white female who underwent  elective left and right heart catheterization on the afternoon of August 10, 2004, for known history of critical aortic stenosis.  This procedure was  complicated by perforation of the left ventricle, presumably by guide wire  utilized to pass a catheter across the aortic valve into the left  ventricular chamber.  The patient developed pericardial tamponade in the  cath lab requiring pericardial drain placement under ultrasound guidance.  A  full consultation note has been dictated previously.  The patient and her  husband have been counseled regarding the need for emergent surgical  intervention.  They understand associated risks and desire to proceed as  described.   OPERATIVE FINDINGS:  1.  Large tear measuring in excess of 6 cm in length along  the lateral wall      of the left ventricle which had appearance of dissected perforation      related to bleeding through the left ventricular muscle.  2.  Critical aortic stenosis with bicuspid aortic valve and severe      calcification within the aortic valve and the aortic annulus.  3.  Normal left ventricular systolic function with moderate left ventricular      hypertrophy.  4.  Aneurysmal dilatation of the proximal ascending thoracic aorta,      measuring greater than 5.2 cm in diameter.   OPERATIVE NOTE IN DETAIL:  The patient was brought directly from the cardiac  cath lab to the operating room on the evening of August 10, 2004, and  placed in the supine position on the operating table.  Intravenous  antibiotics are administered.  Sentinel monitoring was established by the  anesthesia service under the care and direction of  Dr. Hart Robinsons.  Specifically, a Theone Murdoch catheter is placed through the right internal  jugular approach.  A radial arterial line is placed.  General endotracheal  anesthesia is induced uneventfully.  The existing pericardial drain is then  removed, after which time the patient's anterior chest, abdomen, both  groins, and both lower extremities are prepared and draped in a sterile  manner.   Baseline transesophageal echocardiogram is performed by Dr. Gelene Mink.  This  confirms the presence of critical aortic stenosis.  There is trivial aortic  insufficiency.  There is trace mitral regurgitation.  There is normal left  ventricular systolic function with moderate left ventricular hypertrophy.  There appears to be aneurysmal dilatation of the ascending and thoracic  aorta.   A median sternotomy incision is performed.  The pericardium is opened.  Upon  entry into the pericardium one can appreciate a large amount of blood and  hematoma within the pericardial space.  There is obvious aneurysmal  dilatation of the proximal ascending thoracic aorta  which on direct  measurement is greater than 5.2 cm in its transverse diameter.  This becomes  smaller and closer to normal dimensions by the takeoff of the innominate  artery.   The patient is heparinized systemically.  The ascending aorta is cannulated  at the level of the innominate artery.  A two stage venous cannula is placed  through the tip of the right atrial appendage.  A retrograde cardioplegia  catheter is placed through the right atrium into the coronary sinus.  Adequate heparinization is verified.   Cardiopulmonary bypass is begun.  The surface of the heart is inspected.  There is a long tear along the lateral wall of the epicardial surface of the  left ventricle measuring greater than 6 cm in length.  This tear appears to  have developed as a result of rupture of hematoma extending through the free  wall of the left ventricular musculature.  The patient's epicardial surface  is notably quite friable and delicate.  There are no obvious other sites of  perforation anywhere else on the surface of the left or right ventricle that  can be appreciated.  There is also some hematoma higher in the AV groove  posterolaterally but no sign of free perforation in that location.   Left ventricular vent is placed through the right superior pulmonary vein.  A cardioplegic catheter is placed in the ascending aorta.  A temperature  probe is placed in the left ventricular septum anteriorly.   The patient is cooled to 28 degrees systemic temperature.  The aortic cross  clamp is applied and cardioplegia is delivered initially in an antegrade  fashion through the aortic root.  Supplemental cardioplegia is administered  retrograde to the coronary sinus catheter.  Ice saline flush is applied for  topical hypothermia.  The initial cardioplegic arrest and myocardial cooling  are felt to be excellent.  Repeat doses of cardioplegia are administered intermittently throughout the cross clamp portion  of the operation both  through the aortic root and retrograde to the coronary sinus catheter to  maintain septal temperature below 50 degrees Centigrade.   The heart is gently retracted towards the surgeon's side to expose the  posterolateral surface of the left ventricle.  The large tear in the lateral  wall of the left ventricle is repaired using 2-0 Ethibond horizontal  mattress sutures placed across long Teflon felt strips on either side of the  injury.  After the horizontal mattress closure is  completed, a running  simple closure is added as a second layer superficially.  This entire layer  is then reinforced with BioGlue.   After an additional dose of antegrade cardioplegia is administered, the  antegrade cardioplegia catheter is removed and the ascending aorta is  transected just above the sinotubular junction.  The aorta is aneurysmally  dilated and thin-walled but there are no areas of calcification.  The aortic  valve is inspected.  The aortic valve appears to be bicuspid with a  congenitally fused raphe.  There is critical aortic stenosis.  Both leaflets  of the aortic valve are heavily calcified and virtually immobile.  The left  main and right coronary arteries are in the normal anatomic location and  well away from the valve annulus.  There is heavy calcification in the  annulus of the valve itself.  The aortic valve is excised sharply.  The  aortic annulus is decalcified.  This is quite tedious and extensive due to  the severity of calcifications, particularly along the noncoronary cusp of  the valve and extending into the anterior leaflet of the mitral valve.  After calcification is completely debrided, the entire aortic root and left  ventricle are irrigated with copious ice saline solution.   The aortic annulus is sized to accept a 21 mm Edwards bovine pericardial  tissue valve.  Aortic valve replacement is performed using interrupted 2-0  Ethibond horizontal mattress  pledgeted sutures with pledgets in the  subannular position.  An Edwards magnum series bovine pericardial tissue  valve (model #[id number], serial [id number]) is secured in place uneventfully.  The  valve seats normally and without difficulty.  The left main and the right  coronary arteries are well away from the valve sewing ring.   The aorta is trimmed to the left of the sinotubular junction and at this  level the aorta measures approximately 30 mm in diameter.  The remainder of  the ascending aorta is transected close to the aortic cross clamp, more  distally, and the portion of aneurysmal aorta is sent to pathology for  routine evaluation.  The aorta is reconstructed using a 30 mm Hemashield  straight graft.  The proximal anastomosis is performed at the level of the  sinotubular junction using interrupted horizontal mattress pledgeted sutures  with pledgets along the native aortic wall side.  After completion of this anastomosis, it is reinforced with BioGlue.  The graft is then trimmed to an  appropriate length and beveled to meet the distal anastomosis appropriately.  The distal anastomosis is completed with running 4-0 Prolene suture using a  Teflon felt strip to buttress the native aortic side of the anastomosis.  This is also reinforced with BioGlue.   A Magoon needle is placed directly in the anterior wall of the ascending  thoracic aortic Hemashield graft to facilitate evacuation of any residual  air through the aortic root.  The patient is placed in deep Trendelenburg  position.  One final dose of warm retrograde hotshot cardioplegia was  administered.  The lungs were ventilated and the heart allow to fill.  No  residual air is evacuated.  The aortic cross clamp is removed after a total  cross clamp time of 114 minutes.   The heart begins to beat spontaneously without need for cardioversion.  The  retrograde cardioplegia catheter is removed.  The aortic graft is inspected  for  hemostasis.  Epicardial pacing wires are affixed to the right  ventricular outflow tract and to the  right atrial appendage.  The left  ventricular free wall repair is also inspected for hemostasis.  The patient  is rewarmed to 37 degrees Centigrade temperature.  The lungs are ventilated  and the heart allowed to fill.  After all residual air is evacuated, the  left ventricular ventilator and the aortic vent are both removed.   The patient is weaned from cardiopulmonary bypass without difficulty.  The  patient's rhythm at separation from bypass is complete heartblock.  AV  sequential pacing is employed.  The patient is weaned from bypass on low-  dose dopamine at 3 mcg/kg/min.  Total cardiopulmonary bypass time for the  operation is 143 minutes.   Followup transesophageal echocardiogram performed by Dr. Gelene Mink after  separation from bypass demonstrates preserved left ventricular systolic  function.  There is a well seated aortic valve bioprosthesis.  There is no  aortic insufficiency.  There is trace mitral regurgitation.  No other  abnormalities are noted.   The venous and arterial cannula are removed uneventfully.  Protamine is  administered to reverse the anticoagulation.  The mediastinum is irrigated  with saline solution containing vancomycin.  Meticulous surgical hemostasis  is ascertained.  There is moderate coagulopathy.  The patient is transfused  one 10-pack of adult platelets and 2 units fresh frozen plasma.  The patient  received a total of 3 units packed red blood cells during cardiopulmonary  bypass due to anemia.   The mediastinum is drained using two chest tubes exited through separate  stab incisions inferiorly.  The median sternotomy is closed in routine  fashion.  The soft tissues anterior to the sternum are closed in multiple  layers and the skin is closed with a running subcuticular skin closure.   The patient tolerated the procedure well and is transported to  the surgical intensive care unit.  There are no intraoperative complications.  All  sponge, instrument and needle counts are verified correct at completion of  the operation.                                               Salvatore Decent. Cornelius Moras, M.D.    CHO/MEDQ  D:  08/10/2004  T:  08/11/2004  Job:  [id number]   cc:   Peter M. [place], M.D.  [address], [address]  [place], [place] [address]  Fax: [phone]   Wilson Singer, M.D.  [address]  [place]  [place] [address]  Fax: [phone]

## 2011-05-02 ENCOUNTER — Ambulatory Visit (INDEPENDENT_AMBULATORY_CARE_PROVIDER_SITE_OTHER): Payer: Medicare Other | Admitting: *Deleted

## 2011-05-02 DIAGNOSIS — I4891 Unspecified atrial fibrillation: Secondary | ICD-10-CM

## 2011-05-03 ENCOUNTER — Other Ambulatory Visit: Payer: Self-pay | Admitting: Internal Medicine

## 2011-05-03 ENCOUNTER — Ambulatory Visit (INDEPENDENT_AMBULATORY_CARE_PROVIDER_SITE_OTHER): Payer: Medicare Other | Admitting: *Deleted

## 2011-05-03 DIAGNOSIS — I495 Sick sinus syndrome: Secondary | ICD-10-CM

## 2011-05-09 NOTE — Progress Notes (Signed)
PACER REMOTE 

## 2011-05-15 ENCOUNTER — Encounter: Payer: Self-pay | Admitting: *Deleted

## 2011-05-30 ENCOUNTER — Encounter: Payer: Medicare Other | Admitting: *Deleted

## 2011-05-31 ENCOUNTER — Ambulatory Visit (INDEPENDENT_AMBULATORY_CARE_PROVIDER_SITE_OTHER): Payer: Medicare Other | Admitting: *Deleted

## 2011-05-31 DIAGNOSIS — I4891 Unspecified atrial fibrillation: Secondary | ICD-10-CM

## 2011-06-28 ENCOUNTER — Ambulatory Visit (INDEPENDENT_AMBULATORY_CARE_PROVIDER_SITE_OTHER): Payer: Medicare Other | Admitting: *Deleted

## 2011-06-28 DIAGNOSIS — I4891 Unspecified atrial fibrillation: Secondary | ICD-10-CM

## 2011-06-28 LAB — POCT INR: INR: 1.7

## 2011-07-26 ENCOUNTER — Ambulatory Visit (INDEPENDENT_AMBULATORY_CARE_PROVIDER_SITE_OTHER): Payer: Medicare Other | Admitting: *Deleted

## 2011-07-26 DIAGNOSIS — I4891 Unspecified atrial fibrillation: Secondary | ICD-10-CM

## 2011-07-26 LAB — POCT INR: INR: 1.8

## 2011-08-02 ENCOUNTER — Encounter: Payer: Medicare Other | Admitting: *Deleted

## 2011-08-07 ENCOUNTER — Encounter: Payer: Self-pay | Admitting: *Deleted

## 2011-08-09 ENCOUNTER — Ambulatory Visit (INDEPENDENT_AMBULATORY_CARE_PROVIDER_SITE_OTHER): Payer: Medicare Other | Admitting: *Deleted

## 2011-08-09 DIAGNOSIS — I4891 Unspecified atrial fibrillation: Secondary | ICD-10-CM

## 2011-08-09 LAB — POCT INR: INR: 2.6

## 2011-08-15 ENCOUNTER — Encounter: Payer: Self-pay | Admitting: Internal Medicine

## 2011-08-15 ENCOUNTER — Ambulatory Visit (INDEPENDENT_AMBULATORY_CARE_PROVIDER_SITE_OTHER): Payer: Medicare Other | Admitting: *Deleted

## 2011-08-15 ENCOUNTER — Other Ambulatory Visit: Payer: Self-pay

## 2011-08-15 DIAGNOSIS — I495 Sick sinus syndrome: Secondary | ICD-10-CM

## 2011-08-19 LAB — REMOTE PACEMAKER DEVICE
AL AMPLITUDE: 2.8 mv
AL IMPEDENCE PM: 552 Ohm
BATTERY VOLTAGE: 2.96 V
RV LEAD AMPLITUDE: 5.8 mv

## 2011-08-23 LAB — URINALYSIS, ROUTINE W REFLEX MICROSCOPIC
Bilirubin Urine: NEGATIVE
Glucose, UA: NEGATIVE
Specific Gravity, Urine: 1.009
pH: 6

## 2011-08-23 LAB — COMPREHENSIVE METABOLIC PANEL
AST: 39 — ABNORMAL HIGH
CO2: 26
Calcium: 9.2
Creatinine, Ser: 0.94
GFR calc Af Amer: >60
GFR calc non Af Amer: 57 — ABNORMAL LOW

## 2011-08-23 LAB — DIFFERENTIAL
Eosinophils Relative: 3
Lymphocytes Relative: 33
Lymphs Abs: 3.2
Neutro Abs: 5.6

## 2011-08-23 LAB — CBC
MCHC: 34
MCV: 88.8
Platelets: 298
RBC: 4.08

## 2011-08-23 LAB — URINE MICROSCOPIC-ADD ON

## 2011-08-31 NOTE — Progress Notes (Signed)
Pacer checked by remote 

## 2011-09-06 ENCOUNTER — Ambulatory Visit (INDEPENDENT_AMBULATORY_CARE_PROVIDER_SITE_OTHER): Payer: Medicare Other | Admitting: *Deleted

## 2011-09-06 DIAGNOSIS — I4891 Unspecified atrial fibrillation: Secondary | ICD-10-CM

## 2011-09-06 LAB — POCT INR: INR: 3.1

## 2011-09-27 ENCOUNTER — Ambulatory Visit (INDEPENDENT_AMBULATORY_CARE_PROVIDER_SITE_OTHER): Payer: Medicare Other | Admitting: *Deleted

## 2011-09-27 DIAGNOSIS — I4891 Unspecified atrial fibrillation: Secondary | ICD-10-CM

## 2011-09-27 LAB — POCT INR: INR: 2.4

## 2011-10-26 ENCOUNTER — Ambulatory Visit (INDEPENDENT_AMBULATORY_CARE_PROVIDER_SITE_OTHER): Payer: Medicare Other | Admitting: *Deleted

## 2011-10-26 DIAGNOSIS — I4891 Unspecified atrial fibrillation: Secondary | ICD-10-CM

## 2011-10-26 LAB — POCT INR: INR: 2.4

## 2011-10-31 ENCOUNTER — Encounter: Payer: Self-pay | Admitting: Cardiology

## 2011-11-01 ENCOUNTER — Other Ambulatory Visit: Payer: Self-pay | Admitting: *Deleted

## 2011-11-01 ENCOUNTER — Other Ambulatory Visit: Payer: Self-pay | Admitting: Cardiology

## 2011-11-01 MED ORDER — WARFARIN SODIUM 2.5 MG PO TABS: 2.5000 mg | ORAL_TABLET | ORAL | Status: DC

## 2011-11-02 DIAGNOSIS — H35359 Cystoid macular degeneration, unspecified eye: Secondary | ICD-10-CM | POA: Insufficient documentation

## 2011-11-02 DIAGNOSIS — H35059 Retinal neovascularization, unspecified, unspecified eye: Secondary | ICD-10-CM | POA: Insufficient documentation

## 2011-11-16 ENCOUNTER — Encounter: Payer: Self-pay | Admitting: Internal Medicine

## 2011-11-16 ENCOUNTER — Ambulatory Visit (INDEPENDENT_AMBULATORY_CARE_PROVIDER_SITE_OTHER): Payer: Medicare Other | Admitting: Internal Medicine

## 2011-11-16 ENCOUNTER — Ambulatory Visit (INDEPENDENT_AMBULATORY_CARE_PROVIDER_SITE_OTHER): Payer: Medicare Other | Admitting: *Deleted

## 2011-11-16 DIAGNOSIS — I4891 Unspecified atrial fibrillation: Secondary | ICD-10-CM

## 2011-11-16 DIAGNOSIS — I10 Essential (primary) hypertension: Secondary | ICD-10-CM

## 2011-11-16 DIAGNOSIS — I498 Other specified cardiac arrhythmias: Secondary | ICD-10-CM

## 2011-11-16 LAB — PACEMAKER DEVICE OBSERVATION
AL AMPLITUDE: 2.8047 mv
ATRIAL PACING PM: 32.57
BATTERY VOLTAGE: 2.95 V
BRDY-0002RV: 60 {beats}/min
BRDY-0004RV: 130 {beats}/min
RV LEAD IMPEDENCE PM: 448 Ohm
VENTRICULAR PACING PM: 6.54

## 2011-11-16 NOTE — Assessment & Plan Note (Signed)
Normal pacemaker function See Pace Art report No changes today  

## 2011-11-16 NOTE — Progress Notes (Signed)
PCP:  Ralene Ok, MD, MD  The patient presents today for routine electrophysiology followup.  Since last being seen in our clinic, the patient reports doing very well.  She remains active.  She went dancing last night without difficulty.  Today, she denies symptoms of palpitations, chest pain, shortness of breath, orthopnea, PND, lower extremity edema, dizziness, presyncope, syncope, or neurologic sequela.  The patient feels that she is tolerating medications without difficulties and is otherwise without complaint today.   Past Medical History  Diagnosis Date  . Sick sinus syndrome     post DDD pacemaker  . Hypertension   . Edema   . Diabetes mellitus     type 2  . Hyperlipidemia   . Atrial fibrillation     permanent afib/aflutter   Past Surgical History  Procedure Date  . Pacemaker insertion     Current Outpatient Prescriptions  Medication Sig Dispense Refill  . aspirin 81 MG tablet Take 81 mg by mouth daily.        Marland Kitchen atorvastatin (LIPITOR) 80 MG tablet Take 40 mg by mouth daily.        Marland Kitchen glimepiride (AMARYL) 2 MG tablet Take 2 mg by mouth daily before breakfast.        . metoprolol (LOPRESSOR) 50 MG tablet Take 50 mg by mouth 2 (two) times daily.        . valsartan-hydrochlorothiazide (DIOVAN-HCT) 320-12.5 MG per tablet Take 1 tablet by mouth daily.        Marland Kitchen warfarin (COUMADIN) 2.5 MG tablet Take 1 tablet (2.5 mg total) by mouth as directed. Take coumadin as directed  60 tablet  3    Allergies  Allergen Reactions  . Penicillins     History   Social History  . Marital Status: Married    Spouse Name: N/A    Number of Children: N/A  . Years of Education: N/A   Occupational History  . Not on file.   Social History Main Topics  . Smoking status: Never Smoker   . Smokeless tobacco: Never Used  . Alcohol Use: No  . Drug Use: No  . Sexually Active: Not on file   Other Topics Concern  . Not on file   Social History Narrative  . No narrative on file    Family  History  Problem Relation Age of Onset  . Stroke Mother   . Cancer Brother   . Anuerysm Sister     ROS-  All systems are reviewed and are negative except as outlined in the HPI above   Physical Exam: Filed Vitals:   11/16/11 1113  BP: 126/84  Pulse: 79  Height: 5' 2.5" (1.588 m)  Weight: 134 lb (60.782 kg)    GEN- The patient is well appearing, alert and oriented x 3 today.   Head- normocephalic, atraumatic Eyes-  Sclera clear, conjunctiva pink Ears- hearing intact Oropharynx- clear Neck- supple, no JVP Lymph- no cervical lymphadenopathy Lungs- Clear to ausculation bilaterally, normal work of breathing Chest- pacemaker pocket is well healed Heart- Regular rate and rhythm, no murmurs, rubs or gallops, PMI not laterally displaced GI- soft, NT, ND, + BS Extremities- no clubbing, cyanosis, or edema MS- no significant deformity or atrophy Skin- no rash or lesion Psych- euthymic mood, full affect Neuro- strength and sensation are intact  Pacemaker interrogation- reviewed in detail today,  See PACEART report  Assessment and Plan:

## 2011-11-16 NOTE — Patient Instructions (Addendum)
Your physician wants you to follow-up in: 12 months with Dr Jacquiline Doe will receive a reminder letter in the mail two months in advance. If you don't receive a letter, please call our office to schedule the follow-up appointment.  Remote monitoring is used to monitor your Pacemaker of ICD from home. This monitoring reduces the number of office visits required to check your device to one time per year. It allows [place] to keep an eye on the functioning of your device to ensure it is working properly. You are scheduled for a device check from home on 02/21/12. You may send your transmission at any time that day. If you have a wireless device, the transmission will be sent automatically. After your physician reviews your transmission, you will receive a postcard with your next transmission date.   Your physician has recommended you make the following change in your medication:  1) STOP Aspirin

## 2011-11-16 NOTE — Assessment & Plan Note (Signed)
Stable No change required today  

## 2011-11-16 NOTE — Assessment & Plan Note (Signed)
Stable permanent afib Rate controlled Continue coumadin, stop ASA

## 2011-11-26 ENCOUNTER — Encounter: Payer: Self-pay | Admitting: Cardiology

## 2011-11-28 ENCOUNTER — Other Ambulatory Visit: Payer: Self-pay | Admitting: Internal Medicine

## 2011-11-28 MED ORDER — METOPROLOL TARTRATE 50 MG PO TABS: 50.0000 mg | ORAL_TABLET | Freq: Two times a day (BID) | ORAL | Status: DC

## 2011-12-27 DIAGNOSIS — J209 Acute bronchitis, unspecified: Secondary | ICD-10-CM | POA: Diagnosis not present

## 2011-12-27 DIAGNOSIS — J019 Acute sinusitis, unspecified: Secondary | ICD-10-CM | POA: Diagnosis not present

## 2011-12-28 ENCOUNTER — Encounter: Payer: Medicare Other | Admitting: *Deleted

## 2012-01-03 DIAGNOSIS — I119 Hypertensive heart disease without heart failure: Secondary | ICD-10-CM | POA: Diagnosis not present

## 2012-01-03 DIAGNOSIS — E119 Type 2 diabetes mellitus without complications: Secondary | ICD-10-CM | POA: Diagnosis not present

## 2012-01-04 DIAGNOSIS — H353290 Exudative age-related macular degeneration, unspecified eye, stage unspecified: Secondary | ICD-10-CM | POA: Insufficient documentation

## 2012-02-07 DIAGNOSIS — R609 Edema, unspecified: Secondary | ICD-10-CM | POA: Diagnosis not present

## 2012-02-07 DIAGNOSIS — E7800 Pure hypercholesterolemia, unspecified: Secondary | ICD-10-CM | POA: Diagnosis not present

## 2012-02-07 DIAGNOSIS — E119 Type 2 diabetes mellitus without complications: Secondary | ICD-10-CM | POA: Diagnosis not present

## 2012-02-07 DIAGNOSIS — I119 Hypertensive heart disease without heart failure: Secondary | ICD-10-CM | POA: Diagnosis not present

## 2012-02-12 ENCOUNTER — Other Ambulatory Visit: Payer: Self-pay | Admitting: Cardiology

## 2012-02-13 ENCOUNTER — Ambulatory Visit (INDEPENDENT_AMBULATORY_CARE_PROVIDER_SITE_OTHER): Payer: Medicare Other | Admitting: Pharmacist

## 2012-02-13 DIAGNOSIS — I4891 Unspecified atrial fibrillation: Secondary | ICD-10-CM | POA: Diagnosis not present

## 2012-02-14 ENCOUNTER — Other Ambulatory Visit: Payer: Self-pay | Admitting: Cardiology

## 2012-02-21 ENCOUNTER — Encounter: Payer: Self-pay | Admitting: Internal Medicine

## 2012-02-21 ENCOUNTER — Ambulatory Visit (INDEPENDENT_AMBULATORY_CARE_PROVIDER_SITE_OTHER): Payer: Medicare Other | Admitting: *Deleted

## 2012-02-21 DIAGNOSIS — I495 Sick sinus syndrome: Secondary | ICD-10-CM | POA: Diagnosis not present

## 2012-02-25 LAB — REMOTE PACEMAKER DEVICE
AL IMPEDENCE PM: 528 Ohm
ATRIAL PACING PM: 0
BRDY-0002RV: 60 {beats}/min
BRDY-0004RV: 130 {beats}/min

## 2012-03-03 ENCOUNTER — Encounter: Payer: Self-pay | Admitting: *Deleted

## 2012-03-04 NOTE — Progress Notes (Signed)
PPM remote 

## 2012-03-26 ENCOUNTER — Ambulatory Visit (INDEPENDENT_AMBULATORY_CARE_PROVIDER_SITE_OTHER): Payer: Medicare Other | Admitting: *Deleted

## 2012-03-26 DIAGNOSIS — I4891 Unspecified atrial fibrillation: Secondary | ICD-10-CM

## 2012-04-15 DIAGNOSIS — H35059 Retinal neovascularization, unspecified, unspecified eye: Secondary | ICD-10-CM | POA: Diagnosis not present

## 2012-04-15 DIAGNOSIS — H353290 Exudative age-related macular degeneration, unspecified eye, stage unspecified: Secondary | ICD-10-CM | POA: Diagnosis not present

## 2012-04-15 DIAGNOSIS — H353190 Nonexudative age-related macular degeneration, unspecified eye, stage unspecified: Secondary | ICD-10-CM | POA: Diagnosis not present

## 2012-05-07 ENCOUNTER — Other Ambulatory Visit: Payer: Self-pay | Admitting: *Deleted

## 2012-05-07 ENCOUNTER — Ambulatory Visit (INDEPENDENT_AMBULATORY_CARE_PROVIDER_SITE_OTHER): Payer: Medicare Other | Admitting: *Deleted

## 2012-05-07 DIAGNOSIS — I4891 Unspecified atrial fibrillation: Secondary | ICD-10-CM

## 2012-05-07 LAB — POCT INR: INR: 1.8

## 2012-05-07 MED ORDER — WARFARIN SODIUM 2.5 MG PO TABS: ORAL_TABLET | ORAL | Status: DC

## 2012-05-12 ENCOUNTER — Other Ambulatory Visit: Payer: Self-pay | Admitting: Internal Medicine

## 2012-05-12 ENCOUNTER — Ambulatory Visit
Admission: RE | Admit: 2012-05-12 | Discharge: 2012-05-12 | Disposition: A | Discharge status: Home / Self Care | Payer: Medicare Other | Source: Ambulatory Visit | Attending: Internal Medicine | Admitting: Internal Medicine

## 2012-05-12 DIAGNOSIS — R05 Cough: Secondary | ICD-10-CM | POA: Diagnosis not present

## 2012-05-12 DIAGNOSIS — I517 Cardiomegaly: Secondary | ICD-10-CM | POA: Diagnosis not present

## 2012-05-12 DIAGNOSIS — I119 Hypertensive heart disease without heart failure: Secondary | ICD-10-CM | POA: Diagnosis not present

## 2012-05-12 DIAGNOSIS — E7800 Pure hypercholesterolemia, unspecified: Secondary | ICD-10-CM | POA: Diagnosis not present

## 2012-05-12 DIAGNOSIS — R0989 Other specified symptoms and signs involving the circulatory and respiratory systems: Secondary | ICD-10-CM | POA: Diagnosis not present

## 2012-05-12 DIAGNOSIS — E119 Type 2 diabetes mellitus without complications: Secondary | ICD-10-CM | POA: Diagnosis not present

## 2012-05-14 ENCOUNTER — Ambulatory Visit: Payer: Self-pay

## 2012-05-28 ENCOUNTER — Ambulatory Visit (INDEPENDENT_AMBULATORY_CARE_PROVIDER_SITE_OTHER): Payer: Medicare Other | Admitting: *Deleted

## 2012-05-28 DIAGNOSIS — I4891 Unspecified atrial fibrillation: Secondary | ICD-10-CM | POA: Diagnosis not present

## 2012-05-28 LAB — POCT INR: INR: 2

## 2012-05-29 ENCOUNTER — Encounter: Payer: Self-pay | Admitting: Internal Medicine

## 2012-05-29 ENCOUNTER — Ambulatory Visit (INDEPENDENT_AMBULATORY_CARE_PROVIDER_SITE_OTHER): Payer: Medicare Other | Admitting: *Deleted

## 2012-05-29 DIAGNOSIS — I4891 Unspecified atrial fibrillation: Secondary | ICD-10-CM

## 2012-05-29 DIAGNOSIS — I498 Other specified cardiac arrhythmias: Secondary | ICD-10-CM | POA: Diagnosis not present

## 2012-06-02 LAB — REMOTE PACEMAKER DEVICE
BAMS-0001: 170 {beats}/min
BRDY-0002RV: 60 {beats}/min
BRDY-0003RV: 130 {beats}/min
BRDY-0004RV: 130 {beats}/min
RV LEAD AMPLITUDE: 5.799 mv
RV LEAD IMPEDENCE PM: 448 Ohm

## 2012-06-16 ENCOUNTER — Encounter: Payer: Self-pay | Admitting: *Deleted

## 2012-06-25 ENCOUNTER — Ambulatory Visit (INDEPENDENT_AMBULATORY_CARE_PROVIDER_SITE_OTHER): Payer: Medicare Other | Admitting: *Deleted

## 2012-06-25 DIAGNOSIS — I4891 Unspecified atrial fibrillation: Secondary | ICD-10-CM | POA: Diagnosis not present

## 2012-06-25 LAB — POCT INR: INR: 1.8

## 2012-07-18 ENCOUNTER — Ambulatory Visit (INDEPENDENT_AMBULATORY_CARE_PROVIDER_SITE_OTHER): Payer: Medicare Other

## 2012-07-18 DIAGNOSIS — I4891 Unspecified atrial fibrillation: Secondary | ICD-10-CM | POA: Diagnosis not present

## 2012-07-18 LAB — POCT INR: INR: 1.9

## 2012-07-21 DIAGNOSIS — Z111 Encounter for screening for respiratory tuberculosis: Secondary | ICD-10-CM | POA: Diagnosis not present

## 2012-07-30 DIAGNOSIS — J069 Acute upper respiratory infection, unspecified: Secondary | ICD-10-CM | POA: Diagnosis not present

## 2012-07-30 DIAGNOSIS — R5381 Other malaise: Secondary | ICD-10-CM | POA: Diagnosis not present

## 2012-08-08 ENCOUNTER — Ambulatory Visit (INDEPENDENT_AMBULATORY_CARE_PROVIDER_SITE_OTHER): Payer: Medicare Other | Admitting: Pharmacist

## 2012-08-08 DIAGNOSIS — I4891 Unspecified atrial fibrillation: Secondary | ICD-10-CM

## 2012-08-08 LAB — POCT INR: INR: 2.3

## 2012-08-18 DIAGNOSIS — I119 Hypertensive heart disease without heart failure: Secondary | ICD-10-CM | POA: Diagnosis not present

## 2012-08-18 DIAGNOSIS — Z23 Encounter for immunization: Secondary | ICD-10-CM | POA: Diagnosis not present

## 2012-08-18 DIAGNOSIS — J301 Allergic rhinitis due to pollen: Secondary | ICD-10-CM | POA: Diagnosis not present

## 2012-08-18 DIAGNOSIS — E119 Type 2 diabetes mellitus without complications: Secondary | ICD-10-CM | POA: Diagnosis not present

## 2012-08-26 DIAGNOSIS — H35359 Cystoid macular degeneration, unspecified eye: Secondary | ICD-10-CM | POA: Diagnosis not present

## 2012-08-26 DIAGNOSIS — H3530 Unspecified macular degeneration: Secondary | ICD-10-CM | POA: Diagnosis not present

## 2012-08-26 DIAGNOSIS — H35059 Retinal neovascularization, unspecified, unspecified eye: Secondary | ICD-10-CM | POA: Diagnosis not present

## 2012-09-01 ENCOUNTER — Ambulatory Visit (INDEPENDENT_AMBULATORY_CARE_PROVIDER_SITE_OTHER): Payer: Medicare Other | Admitting: *Deleted

## 2012-09-01 DIAGNOSIS — I498 Other specified cardiac arrhythmias: Secondary | ICD-10-CM | POA: Diagnosis not present

## 2012-09-01 DIAGNOSIS — I4891 Unspecified atrial fibrillation: Secondary | ICD-10-CM

## 2012-09-02 LAB — REMOTE PACEMAKER DEVICE
AL IMPEDENCE PM: 392 Ohm
BATTERY VOLTAGE: 2.92 V
RV LEAD IMPEDENCE PM: 440 Ohm

## 2012-09-02 NOTE — Progress Notes (Signed)
Remote pacer check  

## 2012-09-10 ENCOUNTER — Other Ambulatory Visit: Payer: Self-pay | Admitting: *Deleted

## 2012-09-10 ENCOUNTER — Encounter: Payer: Self-pay | Admitting: *Deleted

## 2012-09-10 DIAGNOSIS — H2510 Age-related nuclear cataract, unspecified eye: Secondary | ICD-10-CM | POA: Diagnosis not present

## 2012-09-10 MED ORDER — WARFARIN SODIUM 2.5 MG PO TABS: ORAL_TABLET | ORAL | Status: DC

## 2012-09-17 ENCOUNTER — Encounter: Payer: Self-pay | Admitting: Internal Medicine

## 2012-10-20 DIAGNOSIS — I119 Hypertensive heart disease without heart failure: Secondary | ICD-10-CM | POA: Diagnosis not present

## 2012-10-20 DIAGNOSIS — E119 Type 2 diabetes mellitus without complications: Secondary | ICD-10-CM | POA: Diagnosis not present

## 2012-10-20 DIAGNOSIS — IMO0001 Reserved for inherently not codable concepts without codable children: Secondary | ICD-10-CM | POA: Diagnosis not present

## 2012-10-20 DIAGNOSIS — E559 Vitamin D deficiency, unspecified: Secondary | ICD-10-CM | POA: Diagnosis not present

## 2012-10-20 DIAGNOSIS — E7800 Pure hypercholesterolemia, unspecified: Secondary | ICD-10-CM | POA: Diagnosis not present

## 2012-10-24 DIAGNOSIS — H6120 Impacted cerumen, unspecified ear: Secondary | ICD-10-CM | POA: Diagnosis not present

## 2012-11-18 ENCOUNTER — Ambulatory Visit (INDEPENDENT_AMBULATORY_CARE_PROVIDER_SITE_OTHER): Payer: Medicare Other | Admitting: *Deleted

## 2012-11-18 DIAGNOSIS — I4891 Unspecified atrial fibrillation: Secondary | ICD-10-CM | POA: Diagnosis not present

## 2012-11-18 LAB — POCT INR: INR: 1.3

## 2012-11-18 MED ORDER — WARFARIN SODIUM 2.5 MG PO TABS: ORAL_TABLET | ORAL | Status: DC

## 2012-11-28 ENCOUNTER — Ambulatory Visit (INDEPENDENT_AMBULATORY_CARE_PROVIDER_SITE_OTHER): Payer: Medicare Other | Admitting: *Deleted

## 2012-11-28 DIAGNOSIS — I4891 Unspecified atrial fibrillation: Secondary | ICD-10-CM | POA: Diagnosis not present

## 2012-12-19 ENCOUNTER — Ambulatory Visit (INDEPENDENT_AMBULATORY_CARE_PROVIDER_SITE_OTHER): Payer: Medicare Other | Admitting: *Deleted

## 2012-12-19 DIAGNOSIS — I4891 Unspecified atrial fibrillation: Secondary | ICD-10-CM | POA: Diagnosis not present

## 2012-12-19 MED ORDER — WARFARIN SODIUM 2.5 MG PO TABS: ORAL_TABLET | ORAL | Status: DC

## 2013-01-01 ENCOUNTER — Encounter: Payer: Medicare Other | Admitting: Internal Medicine

## 2013-01-02 DIAGNOSIS — H35059 Retinal neovascularization, unspecified, unspecified eye: Secondary | ICD-10-CM | POA: Diagnosis not present

## 2013-01-02 DIAGNOSIS — H2510 Age-related nuclear cataract, unspecified eye: Secondary | ICD-10-CM | POA: Diagnosis not present

## 2013-01-02 DIAGNOSIS — H35359 Cystoid macular degeneration, unspecified eye: Secondary | ICD-10-CM | POA: Diagnosis not present

## 2013-01-02 DIAGNOSIS — H3530 Unspecified macular degeneration: Secondary | ICD-10-CM | POA: Diagnosis not present

## 2013-01-09 DIAGNOSIS — I87039 Postthrombotic syndrome with ulcer and inflammation of unspecified lower extremity: Secondary | ICD-10-CM | POA: Diagnosis not present

## 2013-01-09 DIAGNOSIS — E119 Type 2 diabetes mellitus without complications: Secondary | ICD-10-CM | POA: Diagnosis not present

## 2013-01-10 DIAGNOSIS — E119 Type 2 diabetes mellitus without complications: Secondary | ICD-10-CM | POA: Diagnosis not present

## 2013-01-10 DIAGNOSIS — R609 Edema, unspecified: Secondary | ICD-10-CM | POA: Diagnosis not present

## 2013-01-10 DIAGNOSIS — I10 Essential (primary) hypertension: Secondary | ICD-10-CM | POA: Diagnosis not present

## 2013-01-10 DIAGNOSIS — Z4800 Encounter for change or removal of nonsurgical wound dressing: Secondary | ICD-10-CM | POA: Diagnosis not present

## 2013-01-10 DIAGNOSIS — L02419 Cutaneous abscess of limb, unspecified: Secondary | ICD-10-CM | POA: Diagnosis not present

## 2013-01-13 DIAGNOSIS — L02419 Cutaneous abscess of limb, unspecified: Secondary | ICD-10-CM | POA: Diagnosis not present

## 2013-01-13 DIAGNOSIS — E119 Type 2 diabetes mellitus without complications: Secondary | ICD-10-CM | POA: Diagnosis not present

## 2013-01-13 DIAGNOSIS — L03119 Cellulitis of unspecified part of limb: Secondary | ICD-10-CM | POA: Diagnosis not present

## 2013-01-13 DIAGNOSIS — I10 Essential (primary) hypertension: Secondary | ICD-10-CM | POA: Diagnosis not present

## 2013-01-13 DIAGNOSIS — Z4800 Encounter for change or removal of nonsurgical wound dressing: Secondary | ICD-10-CM | POA: Diagnosis not present

## 2013-01-13 DIAGNOSIS — R609 Edema, unspecified: Secondary | ICD-10-CM | POA: Diagnosis not present

## 2013-01-17 DIAGNOSIS — R609 Edema, unspecified: Secondary | ICD-10-CM | POA: Diagnosis not present

## 2013-01-17 DIAGNOSIS — Z4800 Encounter for change or removal of nonsurgical wound dressing: Secondary | ICD-10-CM | POA: Diagnosis not present

## 2013-01-17 DIAGNOSIS — E119 Type 2 diabetes mellitus without complications: Secondary | ICD-10-CM | POA: Diagnosis not present

## 2013-01-17 DIAGNOSIS — I10 Essential (primary) hypertension: Secondary | ICD-10-CM | POA: Diagnosis not present

## 2013-01-17 DIAGNOSIS — L03119 Cellulitis of unspecified part of limb: Secondary | ICD-10-CM | POA: Diagnosis not present

## 2013-01-19 DIAGNOSIS — E119 Type 2 diabetes mellitus without complications: Secondary | ICD-10-CM | POA: Diagnosis not present

## 2013-01-19 DIAGNOSIS — L02419 Cutaneous abscess of limb, unspecified: Secondary | ICD-10-CM | POA: Diagnosis not present

## 2013-01-19 DIAGNOSIS — R609 Edema, unspecified: Secondary | ICD-10-CM | POA: Diagnosis not present

## 2013-01-19 DIAGNOSIS — I10 Essential (primary) hypertension: Secondary | ICD-10-CM | POA: Diagnosis not present

## 2013-01-19 DIAGNOSIS — Z4800 Encounter for change or removal of nonsurgical wound dressing: Secondary | ICD-10-CM | POA: Diagnosis not present

## 2013-01-21 DIAGNOSIS — Z0000 Encounter for general adult medical examination without abnormal findings: Secondary | ICD-10-CM | POA: Diagnosis not present

## 2013-01-21 DIAGNOSIS — L02419 Cutaneous abscess of limb, unspecified: Secondary | ICD-10-CM | POA: Diagnosis not present

## 2013-01-21 DIAGNOSIS — E7800 Pure hypercholesterolemia, unspecified: Secondary | ICD-10-CM | POA: Diagnosis not present

## 2013-01-21 DIAGNOSIS — E119 Type 2 diabetes mellitus without complications: Secondary | ICD-10-CM | POA: Diagnosis not present

## 2013-01-21 DIAGNOSIS — I119 Hypertensive heart disease without heart failure: Secondary | ICD-10-CM | POA: Diagnosis not present

## 2013-01-21 DIAGNOSIS — Z006 Encounter for examination for normal comparison and control in clinical research program: Secondary | ICD-10-CM | POA: Diagnosis not present

## 2013-01-21 DIAGNOSIS — Z7902 Long term (current) use of antithrombotics/antiplatelets: Secondary | ICD-10-CM | POA: Diagnosis not present

## 2013-01-21 DIAGNOSIS — Z7901 Long term (current) use of anticoagulants: Secondary | ICD-10-CM | POA: Diagnosis not present

## 2013-01-23 ENCOUNTER — Encounter: Payer: Self-pay | Admitting: Internal Medicine

## 2013-01-23 ENCOUNTER — Ambulatory Visit (INDEPENDENT_AMBULATORY_CARE_PROVIDER_SITE_OTHER): Payer: Medicare Other | Admitting: Internal Medicine

## 2013-01-23 ENCOUNTER — Ambulatory Visit (INDEPENDENT_AMBULATORY_CARE_PROVIDER_SITE_OTHER): Payer: Medicare Other

## 2013-01-23 VITALS — BP 102/57 | HR 80 | Ht 63.0 in | Wt 121.1 lb

## 2013-01-23 DIAGNOSIS — I498 Other specified cardiac arrhythmias: Secondary | ICD-10-CM

## 2013-01-23 DIAGNOSIS — I4891 Unspecified atrial fibrillation: Secondary | ICD-10-CM | POA: Diagnosis not present

## 2013-01-23 LAB — PACEMAKER DEVICE OBSERVATION
AL IMPEDENCE PM: 504 Ohm
ATRIAL PACING PM: 0
BRDY-0004RV: 130 {beats}/min
RV LEAD THRESHOLD: 1 V
VENTRICULAR PACING PM: 10.6

## 2013-01-23 NOTE — Assessment & Plan Note (Signed)
Permanent afib Will reprogram PPM VVIR  Continue coumadin

## 2013-01-23 NOTE — Patient Instructions (Signed)
Your physician wants you to follow-up in:  6 months with Nancy Payne,NP12 months with Dr Nancy Payne will receive a reminder letter in the mail two months in advance. If you don't receive a letter, please call our office to schedule the follow-up appointment.  Remote monitoring is used to monitor your Pacemaker of ICD from home. This monitoring reduces the number of office visits required to check your device to one time per year. It allows [place] to keep an eye on the functioning of your device to ensure it is working properly. You are scheduled for a device check from home on 04/20/13. You may send your transmission at any time that day. If you have a wireless device, the transmission will be sent automatically. After your physician reviews your transmission, you will receive a postcard with your next transmission date.

## 2013-01-23 NOTE — Assessment & Plan Note (Signed)
Normal pacemaker function See Pace Art report No changes today  

## 2013-01-23 NOTE — Progress Notes (Signed)
PCP:  Ralene Ok, MD  The patient presents today for routine electrophysiology followup.  Since last being seen in our clinic, the patient reports doing very well.    Today, she denies symptoms of palpitations, chest pain, shortness of breath, orthopnea, PND, lower extremity edema, dizziness, presyncope, syncope, or neurologic sequela.  The patient feels that she is tolerating medications without difficulties and is otherwise without complaint today.   Past Medical History  Diagnosis Date  . Sick sinus syndrome     post DDD pacemaker  . Hypertension   . Edema   . Diabetes mellitus     type 2  . Hyperlipidemia   . Atrial fibrillation     permanent afib/aflutter   Past Surgical History  Procedure Laterality Date  . Pacemaker insertion      Current Outpatient Prescriptions  Medication Sig Dispense Refill  . furosemide (LASIX) 40 MG tablet Take 1 tablet (40 mg total) by mouth as needed.  30 tablet  6  . glimepiride (AMARYL) 2 MG tablet Take 2 mg by mouth daily before breakfast.        . metoprolol (LOPRESSOR) 50 MG tablet Take 1 tablet (50 mg total) by mouth 2 (two) times daily.  60 tablet  11  . metoprolol succinate (TOPROL-XL) 25 MG 24 hr tablet Take 25 mg by mouth 2 (two) times daily.      . Rosuvastatin Calcium (CRESTOR PO) Take by mouth daily.      . valsartan-hydrochlorothiazide (DIOVAN-HCT) 320-12.5 MG per tablet Take 1 tablet by mouth daily.        Marland Kitchen warfarin (COUMADIN) 2.5 MG tablet Take as directed by coumadin clinic  70 tablet  3   No current facility-administered medications for this visit.    Allergies  Allergen Reactions  . Penicillins     History   Social History  . Marital Status: Married    Spouse Name: N/A    Number of Children: N/A  . Years of Education: N/A   Occupational History  . Not on file.   Social History Main Topics  . Smoking status: Never Smoker   . Smokeless tobacco: Never Used  . Alcohol Use: No  . Drug Use: No  . Sexually Active:  Not on file   Other Topics Concern  . Not on file   Social History Narrative  . No narrative on file    Family History  Problem Relation Age of Onset  . Stroke Mother   . Cancer Brother   . Anuerysm Sister     ROS-  All systems are reviewed and are negative except as outlined in the HPI above   Physical Exam: Filed Vitals:   01/23/13 1543  BP: 102/57  Pulse: 80  Height: 5\' 3"  (1.6 m)  Weight: 121 lb 1.9 oz (54.94 kg)    GEN- The patient is well appearing, alert and oriented x 3 today.   Head- normocephalic, atraumatic Eyes-  Sclera clear, conjunctiva pink Ears- hearing intact Oropharynx- clear Neck- supple, Lungs- Clear to ausculation bilaterally, normal work of breathing Chest- pacemaker pocket is well healed Heart- irregular rate and rhythm, no murmurs, rubs or gallops, PMI not laterally displaced GI- soft, NT, ND, + BS Extremities- no clubbing, cyanosis, or edema MS- no significant deformity or atrophy Skin- no rash or lesion Psych- euthymic mood, full affect Neuro- strength and sensation are intact  Pacemaker interrogation- reviewed in detail today,  See PACEART report  Assessment and Plan:

## 2013-01-26 DIAGNOSIS — E875 Hyperkalemia: Secondary | ICD-10-CM | POA: Diagnosis not present

## 2013-01-26 DIAGNOSIS — L02419 Cutaneous abscess of limb, unspecified: Secondary | ICD-10-CM | POA: Diagnosis not present

## 2013-02-02 DIAGNOSIS — R609 Edema, unspecified: Secondary | ICD-10-CM | POA: Diagnosis not present

## 2013-02-02 DIAGNOSIS — E119 Type 2 diabetes mellitus without complications: Secondary | ICD-10-CM | POA: Diagnosis not present

## 2013-02-02 DIAGNOSIS — I10 Essential (primary) hypertension: Secondary | ICD-10-CM | POA: Diagnosis not present

## 2013-02-02 DIAGNOSIS — L02419 Cutaneous abscess of limb, unspecified: Secondary | ICD-10-CM | POA: Diagnosis not present

## 2013-02-04 ENCOUNTER — Ambulatory Visit (INDEPENDENT_AMBULATORY_CARE_PROVIDER_SITE_OTHER): Payer: Medicare Other | Admitting: *Deleted

## 2013-02-04 DIAGNOSIS — I4891 Unspecified atrial fibrillation: Secondary | ICD-10-CM | POA: Diagnosis not present

## 2013-02-04 LAB — POCT INR: INR: 3.6

## 2013-02-06 DIAGNOSIS — E119 Type 2 diabetes mellitus without complications: Secondary | ICD-10-CM | POA: Diagnosis not present

## 2013-02-06 DIAGNOSIS — I10 Essential (primary) hypertension: Secondary | ICD-10-CM | POA: Diagnosis not present

## 2013-02-06 DIAGNOSIS — L03119 Cellulitis of unspecified part of limb: Secondary | ICD-10-CM | POA: Diagnosis not present

## 2013-02-09 DIAGNOSIS — E119 Type 2 diabetes mellitus without complications: Secondary | ICD-10-CM | POA: Diagnosis not present

## 2013-02-16 DIAGNOSIS — I10 Essential (primary) hypertension: Secondary | ICD-10-CM | POA: Diagnosis not present

## 2013-02-16 DIAGNOSIS — E119 Type 2 diabetes mellitus without complications: Secondary | ICD-10-CM | POA: Diagnosis not present

## 2013-02-16 DIAGNOSIS — L02419 Cutaneous abscess of limb, unspecified: Secondary | ICD-10-CM | POA: Diagnosis not present

## 2013-02-16 DIAGNOSIS — R609 Edema, unspecified: Secondary | ICD-10-CM | POA: Diagnosis not present

## 2013-02-18 DIAGNOSIS — E119 Type 2 diabetes mellitus without complications: Secondary | ICD-10-CM | POA: Diagnosis not present

## 2013-02-18 DIAGNOSIS — Z79899 Other long term (current) drug therapy: Secondary | ICD-10-CM | POA: Diagnosis not present

## 2013-02-18 DIAGNOSIS — I4891 Unspecified atrial fibrillation: Secondary | ICD-10-CM | POA: Diagnosis not present

## 2013-02-18 DIAGNOSIS — R609 Edema, unspecified: Secondary | ICD-10-CM | POA: Diagnosis not present

## 2013-02-18 DIAGNOSIS — I119 Hypertensive heart disease without heart failure: Secondary | ICD-10-CM | POA: Diagnosis not present

## 2013-02-19 ENCOUNTER — Ambulatory Visit (INDEPENDENT_AMBULATORY_CARE_PROVIDER_SITE_OTHER): Payer: Medicare Other

## 2013-02-19 DIAGNOSIS — I4891 Unspecified atrial fibrillation: Secondary | ICD-10-CM | POA: Diagnosis not present

## 2013-02-19 LAB — POCT INR: INR: 3.5

## 2013-02-23 DIAGNOSIS — Z4800 Encounter for change or removal of nonsurgical wound dressing: Secondary | ICD-10-CM | POA: Diagnosis not present

## 2013-02-23 DIAGNOSIS — R609 Edema, unspecified: Secondary | ICD-10-CM | POA: Diagnosis not present

## 2013-02-23 DIAGNOSIS — L02419 Cutaneous abscess of limb, unspecified: Secondary | ICD-10-CM | POA: Diagnosis not present

## 2013-02-23 DIAGNOSIS — I10 Essential (primary) hypertension: Secondary | ICD-10-CM | POA: Diagnosis not present

## 2013-02-23 DIAGNOSIS — E119 Type 2 diabetes mellitus without complications: Secondary | ICD-10-CM | POA: Diagnosis not present

## 2013-02-24 DIAGNOSIS — L02419 Cutaneous abscess of limb, unspecified: Secondary | ICD-10-CM | POA: Diagnosis not present

## 2013-02-24 DIAGNOSIS — I10 Essential (primary) hypertension: Secondary | ICD-10-CM | POA: Diagnosis not present

## 2013-02-24 DIAGNOSIS — Z4800 Encounter for change or removal of nonsurgical wound dressing: Secondary | ICD-10-CM | POA: Diagnosis not present

## 2013-02-24 DIAGNOSIS — R609 Edema, unspecified: Secondary | ICD-10-CM | POA: Diagnosis not present

## 2013-02-24 DIAGNOSIS — E119 Type 2 diabetes mellitus without complications: Secondary | ICD-10-CM | POA: Diagnosis not present

## 2013-03-02 ENCOUNTER — Telehealth: Payer: Self-pay | Admitting: Cardiology

## 2013-03-02 DIAGNOSIS — L02419 Cutaneous abscess of limb, unspecified: Secondary | ICD-10-CM | POA: Diagnosis not present

## 2013-03-02 DIAGNOSIS — E119 Type 2 diabetes mellitus without complications: Secondary | ICD-10-CM | POA: Diagnosis not present

## 2013-03-02 DIAGNOSIS — Z4800 Encounter for change or removal of nonsurgical wound dressing: Secondary | ICD-10-CM | POA: Diagnosis not present

## 2013-03-02 DIAGNOSIS — R609 Edema, unspecified: Secondary | ICD-10-CM | POA: Diagnosis not present

## 2013-03-02 DIAGNOSIS — I10 Essential (primary) hypertension: Secondary | ICD-10-CM | POA: Diagnosis not present

## 2013-03-02 NOTE — Telephone Encounter (Signed)
New problem   The pt has left arm swelling and lower extremity swelling per Cindy/Nurse from [facility] thinks the pt need to be seen today. Pt has hx of Afib/CHF pt has pacemaker. Please call pt's daughter Annabelle Harman concerning this matter.

## 2013-03-02 NOTE — Telephone Encounter (Signed)
Spoke to patient's daughter Annabelle Harman she stated her mother has been having swelling in left arm,lf hand and both feet,lower legs since last Thursday 02/26/13.States she ran out of lasix for 2 days and has been taking lasix 40 mg daily since 02/26/13.Advised of no salt.Daughter request patient to be seen.Appointment scheduled with Norma Fredrickson NP Friday 03/06/13.

## 2013-03-03 ENCOUNTER — Encounter: Payer: Self-pay | Admitting: Cardiology

## 2013-03-03 ENCOUNTER — Other Ambulatory Visit: Payer: Self-pay | Admitting: *Deleted

## 2013-03-03 MED ORDER — FUROSEMIDE 40 MG PO TABS: 40.0000 mg | ORAL_TABLET | ORAL | Status: DC | PRN

## 2013-03-06 ENCOUNTER — Ambulatory Visit (INDEPENDENT_AMBULATORY_CARE_PROVIDER_SITE_OTHER): Payer: Medicare Other | Admitting: *Deleted

## 2013-03-06 ENCOUNTER — Encounter: Payer: Self-pay | Admitting: Nurse Practitioner

## 2013-03-06 ENCOUNTER — Ambulatory Visit (INDEPENDENT_AMBULATORY_CARE_PROVIDER_SITE_OTHER): Payer: Medicare Other | Admitting: Nurse Practitioner

## 2013-03-06 VITALS — BP 100/70 | HR 84 | Ht 63.0 in | Wt 140.8 lb

## 2013-03-06 DIAGNOSIS — Z952 Presence of prosthetic heart valve: Secondary | ICD-10-CM

## 2013-03-06 DIAGNOSIS — R0609 Other forms of dyspnea: Secondary | ICD-10-CM | POA: Diagnosis not present

## 2013-03-06 DIAGNOSIS — R5381 Other malaise: Secondary | ICD-10-CM | POA: Diagnosis not present

## 2013-03-06 DIAGNOSIS — R5383 Other fatigue: Secondary | ICD-10-CM

## 2013-03-06 DIAGNOSIS — R0989 Other specified symptoms and signs involving the circulatory and respiratory systems: Secondary | ICD-10-CM | POA: Diagnosis not present

## 2013-03-06 DIAGNOSIS — I4891 Unspecified atrial fibrillation: Secondary | ICD-10-CM

## 2013-03-06 DIAGNOSIS — L97909 Non-pressure chronic ulcer of unspecified part of unspecified lower leg with unspecified severity: Secondary | ICD-10-CM | POA: Diagnosis not present

## 2013-03-06 DIAGNOSIS — R609 Edema, unspecified: Secondary | ICD-10-CM

## 2013-03-06 DIAGNOSIS — L97911 Non-pressure chronic ulcer of unspecified part of right lower leg limited to breakdown of skin: Secondary | ICD-10-CM

## 2013-03-06 DIAGNOSIS — Z954 Presence of other heart-valve replacement: Secondary | ICD-10-CM | POA: Diagnosis not present

## 2013-03-06 DIAGNOSIS — R0600 Dyspnea, unspecified: Secondary | ICD-10-CM

## 2013-03-06 LAB — BASIC METABOLIC PANEL
BUN: 46 mg/dL — ABNORMAL HIGH (ref 6–23)
CO2: 28 mEq/L (ref 19–32)
Calcium: 8.7 mg/dL (ref 8.4–10.5)
Chloride: 101 mEq/L (ref 96–112)
Creat: 1.48 mg/dL — ABNORMAL HIGH (ref 0.50–1.10)
Glucose, Bld: 169 mg/dL — ABNORMAL HIGH (ref 70–99)
Potassium: 3.7 mEq/L (ref 3.5–5.3)
Sodium: 140 mEq/L (ref 135–145)

## 2013-03-06 LAB — CBC WITH DIFFERENTIAL/PLATELET
Basophils Absolute: 0.1 10*3/uL (ref 0.0–0.1)
Basophils Relative: 1 % (ref 0–1)
Eosinophils Absolute: 0.2 10*3/uL (ref 0.0–0.7)
Eosinophils Relative: 4 % (ref 0–5)
HCT: 38 % (ref 36.0–46.0)
Hemoglobin: 12.2 g/dL (ref 12.0–15.0)
Lymphocytes Relative: 25 % (ref 12–46)
Lymphs Abs: 1.7 10*3/uL (ref 0.7–4.0)
MCH: 29.1 pg (ref 26.0–34.0)
MCHC: 32.1 g/dL (ref 30.0–36.0)
MCV: 90.7 fL (ref 78.0–100.0)
Monocytes Absolute: 0.7 10*3/uL (ref 0.1–1.0)
Monocytes Relative: 10 % (ref 3–12)
Neutro Abs: 4.1 10*3/uL (ref 1.7–7.7)
Neutrophils Relative %: 60 % (ref 43–77)
Platelets: 222 10*3/uL (ref 150–400)
RBC: 4.19 MIL/uL (ref 3.87–5.11)
RDW: 15 % (ref 11.5–15.5)
WBC: 6.8 10*3/uL (ref 4.0–10.5)

## 2013-03-06 LAB — POCT INR: INR: 4.2

## 2013-03-06 LAB — TSH: TSH: 2.285 u[IU]/mL (ref 0.350–4.500)

## 2013-03-06 NOTE — Patient Instructions (Addendum)
No Advil or Aleve  Use only Tylenol  We need to check labs today  We need to get an ultrasound of your heart  We need to get an arterial doppler of both legs  We will see you back based on these results  Call the [facility] office at [phone] if you have any questions, problems or concerns.

## 2013-03-06 NOTE — Progress Notes (Signed)
Benjie Karvonen Date of Birth: [birth year] Medical Record #[id number]  History of Present Illness: Ms. Lietz is seen back today for a work in visit. She is seen for Dr. Swaziland. She has multiple issues which include underlying pacemaker for SSS, persistent atrial fib, AS with past AVR, HTN, edema, type 2 DM, HLD, spinal stenosis. She underwent AVR with resection and grafting of the proximal descending thoracic aorta and repair of a left ventricular perforation in September of 2005. She has had past right leg fracture.   She comes in today. She is here with her daughter in law. Ms. Lamontagne has not been seen here since January of 2012. Has had her pacemaker checked and her coumadin monitored. She has developed sores on both legs that will not heal. Currently has her legs wrapped in unna boots by the home care service. No chest pain. Not dizzy. Not really short of breath. Has swelling in her upper thighs and in her left arm. It will go down some overnight. Wondering if she should go to the wound clinic. Has had some pain in her legs - may be claudication. Daughter in law notes increased fatigue. Ms. Boreman is not bathing. Has seemed to have a general decline since the death of her husband back in October 21, 2023.   Current Outpatient Prescriptions on File Prior to Visit  Medication Sig Dispense Refill  . furosemide (LASIX) 40 MG tablet Take 1 tablet (40 mg total) by mouth as needed.  30 tablet  6  . glimepiride (AMARYL) 2 MG tablet Take 2 mg by mouth daily before breakfast.        . metoprolol succinate (TOPROL-XL) 25 MG 24 hr tablet Take 25 mg by mouth 2 (two) times daily.      . Rosuvastatin Calcium (CRESTOR PO) Take 10 mg by mouth daily.       Marland Kitchen warfarin (COUMADIN) 2.5 MG tablet Take as directed by coumadin clinic  70 tablet  3  . valsartan-hydrochlorothiazide (DIOVAN-HCT) 320-12.5 MG per tablet Take 1 tablet by mouth daily.         No current facility-administered medications on file prior to visit.     Allergies  Allergen Reactions  . Penicillins     Past Medical History  Diagnosis Date  . Sick sinus syndrome     post DDD pacemaker  . Hypertension   . Edema   . Diabetes mellitus     type 2  . Hyperlipidemia   . Atrial fibrillation     permanent afib/aflutter    Past Surgical History  Procedure Laterality Date  . Pacemaker insertion      History  Smoking status  . Never Smoker   Smokeless tobacco  . Never Used    History  Alcohol Use No    Family History  Problem Relation Age of Onset  . Stroke Mother   . Cancer Brother   . Anuerysm Sister     Review of Systems: The review of systems is per the HPI.  All other systems were reviewed and are negative.  Physical Exam: BP 100/70  Pulse 84  Ht 5\' 3"  (1.6 m)  Wt 140 lb 12.8 oz (63.866 kg)  BMI 24.95 kg/m2 Patient is a very pleasant elderly female who is in no acute distress. Skin is warm and dry. Color is normal.  HEENT is unremarkable. Normocephalic/atraumatic. PERRL. Sclera are nonicteric. Neck is supple. No masses. No JVD. Lungs are clear. Cardiac exam shows an irregular rhythm. Her rate is  controlled. Abdomen is soft. Upper extremities are with edema. Her lower legs are wrapped with bloody drainage seeping through. Gait and ROM are intact. She is using a cane. No gross neurologic deficits noted.  LABORATORY DATA: PENDING  Lab Results  Component Value Date   WBC 9.8 12/04/2007   HGB 12.3 12/04/2007   HCT 36.2 12/04/2007   PLT 298 12/04/2007   GLUCOSE 173* 12/04/2007   ALT 39* 12/04/2007   AST 39* 12/04/2007   NA 139 12/04/2007   K 3.5 12/04/2007   CL 102 12/04/2007   CREATININE 0.94 12/04/2007   BUN 18 12/04/2007   CO2 26 12/04/2007   INR 3.5 02/19/2013   Assessment / Plan: 1. Bilateral leg ulcers - currently wrapped - not able to assess her pulses - will check ABI's - I suspect she has decreased blood flow. If not, would consider wound care consult.   2. Swelling - check echo and complete labs today.   3. HTN -  blood pressure still on the low side - no longer on her Aldactone or her Diovan HCT - I have left her on her current regimen.   4. Advanced age  Further disposition to follow.  Patient is agreeable to this plan and will call if any problems develop in the interim.   Rosalio Macadamia, RN, ANP-C [facility] [address] [place], [place]  [address]

## 2013-03-07 DIAGNOSIS — L03119 Cellulitis of unspecified part of limb: Secondary | ICD-10-CM | POA: Diagnosis not present

## 2013-03-07 DIAGNOSIS — I10 Essential (primary) hypertension: Secondary | ICD-10-CM | POA: Diagnosis not present

## 2013-03-07 DIAGNOSIS — E119 Type 2 diabetes mellitus without complications: Secondary | ICD-10-CM | POA: Diagnosis not present

## 2013-03-07 DIAGNOSIS — R609 Edema, unspecified: Secondary | ICD-10-CM | POA: Diagnosis not present

## 2013-03-07 DIAGNOSIS — Z4800 Encounter for change or removal of nonsurgical wound dressing: Secondary | ICD-10-CM | POA: Diagnosis not present

## 2013-03-07 LAB — BRAIN NATRIURETIC PEPTIDE: Brain Natriuretic Peptide: 594.7 pg/mL — ABNORMAL HIGH (ref 0.0–100.0)

## 2013-03-09 ENCOUNTER — Encounter (HOSPITAL_BASED_OUTPATIENT_CLINIC_OR_DEPARTMENT_OTHER): Payer: Medicare Other

## 2013-03-09 ENCOUNTER — Telehealth: Payer: Self-pay | Admitting: Cardiology

## 2013-03-09 ENCOUNTER — Encounter: Payer: Self-pay | Admitting: *Deleted

## 2013-03-09 DIAGNOSIS — L02419 Cutaneous abscess of limb, unspecified: Secondary | ICD-10-CM | POA: Diagnosis not present

## 2013-03-09 DIAGNOSIS — Z4800 Encounter for change or removal of nonsurgical wound dressing: Secondary | ICD-10-CM | POA: Diagnosis not present

## 2013-03-09 DIAGNOSIS — E119 Type 2 diabetes mellitus without complications: Secondary | ICD-10-CM | POA: Diagnosis not present

## 2013-03-09 DIAGNOSIS — I10 Essential (primary) hypertension: Secondary | ICD-10-CM | POA: Diagnosis not present

## 2013-03-09 DIAGNOSIS — R609 Edema, unspecified: Secondary | ICD-10-CM | POA: Diagnosis not present

## 2013-03-11 DIAGNOSIS — I10 Essential (primary) hypertension: Secondary | ICD-10-CM | POA: Diagnosis not present

## 2013-03-11 DIAGNOSIS — L02419 Cutaneous abscess of limb, unspecified: Secondary | ICD-10-CM | POA: Diagnosis not present

## 2013-03-11 DIAGNOSIS — R609 Edema, unspecified: Secondary | ICD-10-CM | POA: Diagnosis not present

## 2013-03-11 DIAGNOSIS — E119 Type 2 diabetes mellitus without complications: Secondary | ICD-10-CM | POA: Diagnosis not present

## 2013-03-11 DIAGNOSIS — L03119 Cellulitis of unspecified part of limb: Secondary | ICD-10-CM | POA: Diagnosis not present

## 2013-03-11 DIAGNOSIS — Z4800 Encounter for change or removal of nonsurgical wound dressing: Secondary | ICD-10-CM | POA: Diagnosis not present

## 2013-03-13 ENCOUNTER — Ambulatory Visit (HOSPITAL_COMMUNITY): Payer: Medicare Other | Attending: Cardiology

## 2013-03-13 ENCOUNTER — Encounter (HOSPITAL_BASED_OUTPATIENT_CLINIC_OR_DEPARTMENT_OTHER): Payer: Medicare Other

## 2013-03-13 DIAGNOSIS — L97911 Non-pressure chronic ulcer of unspecified part of right lower leg limited to breakdown of skin: Secondary | ICD-10-CM

## 2013-03-13 DIAGNOSIS — R0600 Dyspnea, unspecified: Secondary | ICD-10-CM

## 2013-03-13 DIAGNOSIS — L97921 Non-pressure chronic ulcer of unspecified part of left lower leg limited to breakdown of skin: Secondary | ICD-10-CM

## 2013-03-13 DIAGNOSIS — R609 Edema, unspecified: Secondary | ICD-10-CM

## 2013-03-13 DIAGNOSIS — Z952 Presence of prosthetic heart valve: Secondary | ICD-10-CM

## 2013-03-13 DIAGNOSIS — R5383 Other fatigue: Secondary | ICD-10-CM

## 2013-03-13 DIAGNOSIS — L97909 Non-pressure chronic ulcer of unspecified part of unspecified lower leg with unspecified severity: Secondary | ICD-10-CM

## 2013-03-13 NOTE — Progress Notes (Signed)
Echocardiogram performed.  

## 2013-03-16 ENCOUNTER — Telehealth: Payer: Self-pay

## 2013-03-16 DIAGNOSIS — I10 Essential (primary) hypertension: Secondary | ICD-10-CM | POA: Diagnosis not present

## 2013-03-16 DIAGNOSIS — L02419 Cutaneous abscess of limb, unspecified: Secondary | ICD-10-CM | POA: Diagnosis not present

## 2013-03-16 DIAGNOSIS — R609 Edema, unspecified: Secondary | ICD-10-CM | POA: Diagnosis not present

## 2013-03-16 DIAGNOSIS — E119 Type 2 diabetes mellitus without complications: Secondary | ICD-10-CM | POA: Diagnosis not present

## 2013-03-16 DIAGNOSIS — Z4800 Encounter for change or removal of nonsurgical wound dressing: Secondary | ICD-10-CM | POA: Diagnosis not present

## 2013-03-16 NOTE — Telephone Encounter (Signed)
Received call from [place] at [facility] she stated patient has 2+ edema in lower legs also has swelling in left lower arm.States patient has stopped aleve,sob with exertion,wt 138 lbs.States she is concerned.States she will be sending patient to the wound care center.Message sent to Dr.Jordan for advise on swelling.

## 2013-03-16 NOTE — Telephone Encounter (Signed)
Patient called was told Dr.Jordan advised to take Lasix 40 mg daily for the next 3 days.Patient stated she has been taking Lasix 40 mg daily for the last 2 weeks.Stated she will be seeing wound care 03/25/13.Message sent to Dr.Jordan for advice.

## 2013-03-16 NOTE — Telephone Encounter (Signed)
BP was low when seen by Lawson Fiscal. Diovan HCT and aldactone were held. Can take lasix 40 mg daily for 3 days. Need to go ahead and get into wound care center.  Peter Swaziland MD, [facility]

## 2013-03-16 NOTE — Telephone Encounter (Signed)
Continue current lasix for now.  Peter Swaziland MD, [facility]

## 2013-03-18 ENCOUNTER — Telehealth: Payer: Self-pay

## 2013-03-18 ENCOUNTER — Ambulatory Visit (INDEPENDENT_AMBULATORY_CARE_PROVIDER_SITE_OTHER): Payer: Medicare Other | Admitting: *Deleted

## 2013-03-18 DIAGNOSIS — I4891 Unspecified atrial fibrillation: Secondary | ICD-10-CM | POA: Diagnosis not present

## 2013-03-18 DIAGNOSIS — E119 Type 2 diabetes mellitus without complications: Secondary | ICD-10-CM | POA: Diagnosis not present

## 2013-03-18 DIAGNOSIS — R609 Edema, unspecified: Secondary | ICD-10-CM | POA: Diagnosis not present

## 2013-03-18 DIAGNOSIS — L02419 Cutaneous abscess of limb, unspecified: Secondary | ICD-10-CM | POA: Diagnosis not present

## 2013-03-18 DIAGNOSIS — I25810 Atherosclerosis of coronary artery bypass graft(s) without angina pectoris: Secondary | ICD-10-CM | POA: Diagnosis not present

## 2013-03-18 DIAGNOSIS — L03119 Cellulitis of unspecified part of limb: Secondary | ICD-10-CM | POA: Diagnosis not present

## 2013-03-18 LAB — POCT INR: INR: 4.2

## 2013-03-18 NOTE — Telephone Encounter (Signed)
Spoke with patient echo results given.Patient was told Dr.Jordan advised to continue Lasix for now.Patient stated her swelling a little better this morning.States she will be seeing wound care next week.States she is able to take antibiotics as long as she takes with yogurt.Advised to call back if needed.

## 2013-03-23 ENCOUNTER — Encounter: Payer: Self-pay | Admitting: *Deleted

## 2013-03-23 ENCOUNTER — Encounter: Payer: Self-pay | Admitting: Pharmacist

## 2013-03-23 ENCOUNTER — Ambulatory Visit (INDEPENDENT_AMBULATORY_CARE_PROVIDER_SITE_OTHER): Payer: Medicare Other | Admitting: Pharmacist

## 2013-03-23 DIAGNOSIS — E119 Type 2 diabetes mellitus without complications: Secondary | ICD-10-CM | POA: Diagnosis not present

## 2013-03-23 DIAGNOSIS — Z4800 Encounter for change or removal of nonsurgical wound dressing: Secondary | ICD-10-CM | POA: Diagnosis not present

## 2013-03-23 DIAGNOSIS — L03119 Cellulitis of unspecified part of limb: Secondary | ICD-10-CM | POA: Diagnosis not present

## 2013-03-23 DIAGNOSIS — I4891 Unspecified atrial fibrillation: Secondary | ICD-10-CM | POA: Diagnosis not present

## 2013-03-23 DIAGNOSIS — R609 Edema, unspecified: Secondary | ICD-10-CM | POA: Diagnosis not present

## 2013-03-23 DIAGNOSIS — I10 Essential (primary) hypertension: Secondary | ICD-10-CM | POA: Diagnosis not present

## 2013-03-23 LAB — POCT INR: INR: 1.9

## 2013-03-25 ENCOUNTER — Encounter (HOSPITAL_BASED_OUTPATIENT_CLINIC_OR_DEPARTMENT_OTHER): Payer: Medicare Other | Attending: General Surgery

## 2013-03-25 DIAGNOSIS — E119 Type 2 diabetes mellitus without complications: Secondary | ICD-10-CM | POA: Diagnosis not present

## 2013-03-25 DIAGNOSIS — I87319 Chronic venous hypertension (idiopathic) with ulcer of unspecified lower extremity: Secondary | ICD-10-CM | POA: Insufficient documentation

## 2013-03-25 DIAGNOSIS — L97909 Non-pressure chronic ulcer of unspecified part of unspecified lower leg with unspecified severity: Secondary | ICD-10-CM | POA: Insufficient documentation

## 2013-03-25 DIAGNOSIS — I509 Heart failure, unspecified: Secondary | ICD-10-CM | POA: Insufficient documentation

## 2013-03-25 DIAGNOSIS — I2510 Atherosclerotic heart disease of native coronary artery without angina pectoris: Secondary | ICD-10-CM | POA: Diagnosis not present

## 2013-03-25 DIAGNOSIS — Z79899 Other long term (current) drug therapy: Secondary | ICD-10-CM | POA: Diagnosis not present

## 2013-03-25 DIAGNOSIS — Z7901 Long term (current) use of anticoagulants: Secondary | ICD-10-CM | POA: Diagnosis not present

## 2013-03-25 DIAGNOSIS — I10 Essential (primary) hypertension: Secondary | ICD-10-CM | POA: Diagnosis not present

## 2013-03-25 DIAGNOSIS — Z950 Presence of cardiac pacemaker: Secondary | ICD-10-CM | POA: Diagnosis not present

## 2013-03-25 DIAGNOSIS — Z954 Presence of other heart-valve replacement: Secondary | ICD-10-CM | POA: Insufficient documentation

## 2013-03-25 DIAGNOSIS — I4891 Unspecified atrial fibrillation: Secondary | ICD-10-CM | POA: Insufficient documentation

## 2013-03-25 LAB — GLUCOSE, CAPILLARY: Glucose-Capillary: 124 mg/dL — ABNORMAL HIGH (ref 70–99)

## 2013-03-26 NOTE — Progress Notes (Signed)
[facility]  NAME:  Nancy Payne, Nancy Payne NO.:  [id number]  MEDICAL RECORD NO.:  [id number]      DATE OF BIRTH:  October 07, 1922  PHYSICIAN:  Ardath Sax, M.D.           VISIT DATE:                                  OFFICE VISIT   She is an delightful 80 year old lady who has had many medical problems including congestive heart failure.  She has also had a mitral valve replacement.  She is on many medicines including Coumadin, metoprolol. She also has a pacemaker for her sick sinus syndrome.  She comes to [place] because of a venous hypertension in both of her legs.  She has chronic venous ulcers.  I treated her today with bag balm and wrapped her in Unna boots.  She will be seen next week, and I do not think there will be much of her problem with healing these superficial venous ulcers, so today on examination, she weighed a 125 pounds.  Her temperature is 97, pulse 89, respirations 16, her blood pressure is 131/87.  She; therefore, has a diagnosis of arteriosclerotic heart disease, atrial fibrillation, hypertension, type 2 diabetes, congestive heart failure.     Ardath Sax, M.D.     PP/MEDQ  D:  03/25/2013  T:  03/26/2013  Job:  [id number]

## 2013-03-30 ENCOUNTER — Ambulatory Visit (INDEPENDENT_AMBULATORY_CARE_PROVIDER_SITE_OTHER): Payer: Medicare Other

## 2013-03-30 DIAGNOSIS — I4891 Unspecified atrial fibrillation: Secondary | ICD-10-CM

## 2013-04-01 DIAGNOSIS — I2510 Atherosclerotic heart disease of native coronary artery without angina pectoris: Secondary | ICD-10-CM | POA: Diagnosis not present

## 2013-04-01 DIAGNOSIS — I87319 Chronic venous hypertension (idiopathic) with ulcer of unspecified lower extremity: Secondary | ICD-10-CM | POA: Diagnosis not present

## 2013-04-01 DIAGNOSIS — I10 Essential (primary) hypertension: Secondary | ICD-10-CM | POA: Diagnosis not present

## 2013-04-01 DIAGNOSIS — I4891 Unspecified atrial fibrillation: Secondary | ICD-10-CM | POA: Diagnosis not present

## 2013-04-08 ENCOUNTER — Encounter (HOSPITAL_BASED_OUTPATIENT_CLINIC_OR_DEPARTMENT_OTHER): Payer: Medicare Other | Attending: General Surgery

## 2013-04-08 DIAGNOSIS — L97809 Non-pressure chronic ulcer of other part of unspecified lower leg with unspecified severity: Secondary | ICD-10-CM | POA: Insufficient documentation

## 2013-04-08 DIAGNOSIS — I872 Venous insufficiency (chronic) (peripheral): Secondary | ICD-10-CM | POA: Diagnosis not present

## 2013-04-08 DIAGNOSIS — I87319 Chronic venous hypertension (idiopathic) with ulcer of unspecified lower extremity: Secondary | ICD-10-CM | POA: Diagnosis not present

## 2013-04-08 DIAGNOSIS — L97909 Non-pressure chronic ulcer of unspecified part of unspecified lower leg with unspecified severity: Secondary | ICD-10-CM | POA: Diagnosis not present

## 2013-04-13 ENCOUNTER — Ambulatory Visit (INDEPENDENT_AMBULATORY_CARE_PROVIDER_SITE_OTHER): Payer: Medicare Other

## 2013-04-13 DIAGNOSIS — I4891 Unspecified atrial fibrillation: Secondary | ICD-10-CM | POA: Diagnosis not present

## 2013-04-15 DIAGNOSIS — L97909 Non-pressure chronic ulcer of unspecified part of unspecified lower leg with unspecified severity: Secondary | ICD-10-CM | POA: Diagnosis not present

## 2013-04-15 DIAGNOSIS — I872 Venous insufficiency (chronic) (peripheral): Secondary | ICD-10-CM | POA: Diagnosis not present

## 2013-04-15 DIAGNOSIS — L97809 Non-pressure chronic ulcer of other part of unspecified lower leg with unspecified severity: Secondary | ICD-10-CM | POA: Diagnosis not present

## 2013-04-20 ENCOUNTER — Ambulatory Visit (INDEPENDENT_AMBULATORY_CARE_PROVIDER_SITE_OTHER): Payer: Medicare Other | Admitting: *Deleted

## 2013-04-20 DIAGNOSIS — Z950 Presence of cardiac pacemaker: Secondary | ICD-10-CM

## 2013-04-20 DIAGNOSIS — I498 Other specified cardiac arrhythmias: Secondary | ICD-10-CM

## 2013-04-22 DIAGNOSIS — I87319 Chronic venous hypertension (idiopathic) with ulcer of unspecified lower extremity: Secondary | ICD-10-CM | POA: Diagnosis not present

## 2013-04-22 DIAGNOSIS — L97809 Non-pressure chronic ulcer of other part of unspecified lower leg with unspecified severity: Secondary | ICD-10-CM | POA: Diagnosis not present

## 2013-04-22 DIAGNOSIS — I872 Venous insufficiency (chronic) (peripheral): Secondary | ICD-10-CM | POA: Diagnosis not present

## 2013-04-22 LAB — GLUCOSE, CAPILLARY

## 2013-04-28 LAB — REMOTE PACEMAKER DEVICE
AL IMPEDENCE PM: 512 Ohm
BATTERY VOLTAGE: 2.89 V
BMOD-0002RV: 7
BMOD-0003RV: 30
BMOD-0004RV: 5
BRDY-0002RV: 60 {beats}/min
RV LEAD AMPLITUDE: 5.5 mv
RV LEAD IMPEDENCE PM: 448 Ohm

## 2013-05-04 ENCOUNTER — Ambulatory Visit (INDEPENDENT_AMBULATORY_CARE_PROVIDER_SITE_OTHER): Payer: Medicare Other | Admitting: Pharmacist

## 2013-05-04 DIAGNOSIS — I4891 Unspecified atrial fibrillation: Secondary | ICD-10-CM

## 2013-05-06 ENCOUNTER — Encounter (HOSPITAL_BASED_OUTPATIENT_CLINIC_OR_DEPARTMENT_OTHER): Payer: Medicare Other | Attending: General Surgery

## 2013-05-06 DIAGNOSIS — I87319 Chronic venous hypertension (idiopathic) with ulcer of unspecified lower extremity: Secondary | ICD-10-CM | POA: Diagnosis not present

## 2013-05-06 DIAGNOSIS — E119 Type 2 diabetes mellitus without complications: Secondary | ICD-10-CM | POA: Insufficient documentation

## 2013-05-06 DIAGNOSIS — L03119 Cellulitis of unspecified part of limb: Secondary | ICD-10-CM | POA: Diagnosis not present

## 2013-05-06 DIAGNOSIS — L97909 Non-pressure chronic ulcer of unspecified part of unspecified lower leg with unspecified severity: Secondary | ICD-10-CM | POA: Insufficient documentation

## 2013-05-06 DIAGNOSIS — L02419 Cutaneous abscess of limb, unspecified: Secondary | ICD-10-CM | POA: Diagnosis not present

## 2013-05-08 ENCOUNTER — Encounter: Payer: Self-pay | Admitting: *Deleted

## 2013-05-09 DIAGNOSIS — L97209 Non-pressure chronic ulcer of unspecified calf with unspecified severity: Secondary | ICD-10-CM | POA: Diagnosis not present

## 2013-05-09 DIAGNOSIS — L03119 Cellulitis of unspecified part of limb: Secondary | ICD-10-CM | POA: Diagnosis not present

## 2013-05-09 DIAGNOSIS — I10 Essential (primary) hypertension: Secondary | ICD-10-CM | POA: Diagnosis not present

## 2013-05-09 DIAGNOSIS — I872 Venous insufficiency (chronic) (peripheral): Secondary | ICD-10-CM | POA: Diagnosis not present

## 2013-05-09 DIAGNOSIS — L02419 Cutaneous abscess of limb, unspecified: Secondary | ICD-10-CM | POA: Diagnosis not present

## 2013-05-09 DIAGNOSIS — E119 Type 2 diabetes mellitus without complications: Secondary | ICD-10-CM | POA: Diagnosis not present

## 2013-05-09 DIAGNOSIS — I4891 Unspecified atrial fibrillation: Secondary | ICD-10-CM | POA: Diagnosis not present

## 2013-05-09 DIAGNOSIS — E785 Hyperlipidemia, unspecified: Secondary | ICD-10-CM | POA: Diagnosis not present

## 2013-05-11 DIAGNOSIS — I10 Essential (primary) hypertension: Secondary | ICD-10-CM | POA: Diagnosis not present

## 2013-05-11 DIAGNOSIS — L97209 Non-pressure chronic ulcer of unspecified calf with unspecified severity: Secondary | ICD-10-CM | POA: Diagnosis not present

## 2013-05-11 DIAGNOSIS — E119 Type 2 diabetes mellitus without complications: Secondary | ICD-10-CM | POA: Diagnosis not present

## 2013-05-11 DIAGNOSIS — I872 Venous insufficiency (chronic) (peripheral): Secondary | ICD-10-CM | POA: Diagnosis not present

## 2013-05-11 DIAGNOSIS — L03119 Cellulitis of unspecified part of limb: Secondary | ICD-10-CM | POA: Diagnosis not present

## 2013-05-11 DIAGNOSIS — I4891 Unspecified atrial fibrillation: Secondary | ICD-10-CM | POA: Diagnosis not present

## 2013-05-13 DIAGNOSIS — L02419 Cutaneous abscess of limb, unspecified: Secondary | ICD-10-CM | POA: Diagnosis not present

## 2013-05-13 DIAGNOSIS — I87319 Chronic venous hypertension (idiopathic) with ulcer of unspecified lower extremity: Secondary | ICD-10-CM | POA: Diagnosis not present

## 2013-05-13 DIAGNOSIS — E119 Type 2 diabetes mellitus without complications: Secondary | ICD-10-CM | POA: Diagnosis not present

## 2013-05-13 LAB — GLUCOSE, CAPILLARY: Glucose-Capillary: 137 mg/dL — ABNORMAL HIGH (ref 70–99)

## 2013-05-18 ENCOUNTER — Ambulatory Visit (INDEPENDENT_AMBULATORY_CARE_PROVIDER_SITE_OTHER): Payer: Medicare Other | Admitting: *Deleted

## 2013-05-18 DIAGNOSIS — I4891 Unspecified atrial fibrillation: Secondary | ICD-10-CM

## 2013-05-18 LAB — POCT INR: INR: 2.5

## 2013-05-19 DIAGNOSIS — I4891 Unspecified atrial fibrillation: Secondary | ICD-10-CM | POA: Diagnosis not present

## 2013-05-19 DIAGNOSIS — L02419 Cutaneous abscess of limb, unspecified: Secondary | ICD-10-CM | POA: Diagnosis not present

## 2013-05-19 DIAGNOSIS — E119 Type 2 diabetes mellitus without complications: Secondary | ICD-10-CM | POA: Diagnosis not present

## 2013-05-19 DIAGNOSIS — L97209 Non-pressure chronic ulcer of unspecified calf with unspecified severity: Secondary | ICD-10-CM | POA: Diagnosis not present

## 2013-05-19 DIAGNOSIS — I10 Essential (primary) hypertension: Secondary | ICD-10-CM | POA: Diagnosis not present

## 2013-05-19 DIAGNOSIS — I872 Venous insufficiency (chronic) (peripheral): Secondary | ICD-10-CM | POA: Diagnosis not present

## 2013-05-20 ENCOUNTER — Encounter: Payer: Self-pay | Admitting: Internal Medicine

## 2013-05-25 ENCOUNTER — Encounter: Payer: Medicare Other | Admitting: *Deleted

## 2013-05-25 ENCOUNTER — Other Ambulatory Visit: Payer: Self-pay | Admitting: *Deleted

## 2013-05-26 MED ORDER — WARFARIN SODIUM 2.5 MG PO TABS: ORAL_TABLET | ORAL | Status: DC

## 2013-05-27 DIAGNOSIS — L02419 Cutaneous abscess of limb, unspecified: Secondary | ICD-10-CM | POA: Diagnosis not present

## 2013-05-27 DIAGNOSIS — E119 Type 2 diabetes mellitus without complications: Secondary | ICD-10-CM | POA: Diagnosis not present

## 2013-05-27 DIAGNOSIS — I87319 Chronic venous hypertension (idiopathic) with ulcer of unspecified lower extremity: Secondary | ICD-10-CM | POA: Diagnosis not present

## 2013-05-27 DIAGNOSIS — L03119 Cellulitis of unspecified part of limb: Secondary | ICD-10-CM | POA: Diagnosis not present

## 2013-06-02 ENCOUNTER — Encounter: Payer: Self-pay | Admitting: *Deleted

## 2013-06-03 ENCOUNTER — Encounter (HOSPITAL_BASED_OUTPATIENT_CLINIC_OR_DEPARTMENT_OTHER): Payer: Medicare Other | Attending: General Surgery

## 2013-06-03 DIAGNOSIS — L97909 Non-pressure chronic ulcer of unspecified part of unspecified lower leg with unspecified severity: Secondary | ICD-10-CM | POA: Diagnosis not present

## 2013-06-03 DIAGNOSIS — I87319 Chronic venous hypertension (idiopathic) with ulcer of unspecified lower extremity: Secondary | ICD-10-CM | POA: Insufficient documentation

## 2013-06-12 DIAGNOSIS — L02419 Cutaneous abscess of limb, unspecified: Secondary | ICD-10-CM | POA: Diagnosis not present

## 2013-06-12 DIAGNOSIS — I872 Venous insufficiency (chronic) (peripheral): Secondary | ICD-10-CM | POA: Diagnosis not present

## 2013-06-12 DIAGNOSIS — E119 Type 2 diabetes mellitus without complications: Secondary | ICD-10-CM | POA: Diagnosis not present

## 2013-06-12 DIAGNOSIS — I4891 Unspecified atrial fibrillation: Secondary | ICD-10-CM | POA: Diagnosis not present

## 2013-06-12 DIAGNOSIS — L97209 Non-pressure chronic ulcer of unspecified calf with unspecified severity: Secondary | ICD-10-CM | POA: Diagnosis not present

## 2013-06-12 DIAGNOSIS — I10 Essential (primary) hypertension: Secondary | ICD-10-CM | POA: Diagnosis not present

## 2013-06-15 ENCOUNTER — Ambulatory Visit (INDEPENDENT_AMBULATORY_CARE_PROVIDER_SITE_OTHER): Payer: Medicare Other | Admitting: *Deleted

## 2013-06-15 DIAGNOSIS — Z950 Presence of cardiac pacemaker: Secondary | ICD-10-CM

## 2013-06-15 DIAGNOSIS — I498 Other specified cardiac arrhythmias: Secondary | ICD-10-CM

## 2013-06-15 DIAGNOSIS — I4891 Unspecified atrial fibrillation: Secondary | ICD-10-CM

## 2013-06-15 LAB — POCT INR: INR: 2.2

## 2013-07-03 DIAGNOSIS — H3530 Unspecified macular degeneration: Secondary | ICD-10-CM | POA: Diagnosis not present

## 2013-07-03 DIAGNOSIS — H35059 Retinal neovascularization, unspecified, unspecified eye: Secondary | ICD-10-CM | POA: Diagnosis not present

## 2013-07-03 DIAGNOSIS — H2510 Age-related nuclear cataract, unspecified eye: Secondary | ICD-10-CM | POA: Diagnosis not present

## 2013-07-03 DIAGNOSIS — H35359 Cystoid macular degeneration, unspecified eye: Secondary | ICD-10-CM | POA: Diagnosis not present

## 2013-07-07 ENCOUNTER — Non-Acute Institutional Stay: Payer: Medicare Other | Admitting: Internal Medicine

## 2013-07-07 ENCOUNTER — Encounter: Payer: Self-pay | Admitting: Internal Medicine

## 2013-07-07 VITALS — BP 128/82 | HR 68 | Temp 96.8°F | Ht 63.0 in | Wt 127.0 lb

## 2013-07-07 DIAGNOSIS — I10 Essential (primary) hypertension: Secondary | ICD-10-CM | POA: Insufficient documentation

## 2013-07-07 DIAGNOSIS — R609 Edema, unspecified: Secondary | ICD-10-CM | POA: Diagnosis not present

## 2013-07-07 DIAGNOSIS — E507 Other ocular manifestations of vitamin A deficiency: Secondary | ICD-10-CM | POA: Insufficient documentation

## 2013-07-07 DIAGNOSIS — E785 Hyperlipidemia, unspecified: Secondary | ICD-10-CM | POA: Diagnosis not present

## 2013-07-07 DIAGNOSIS — H3530 Unspecified macular degeneration: Secondary | ICD-10-CM

## 2013-07-07 NOTE — Progress Notes (Signed)
Date: 07/07/2013  MRN:  [id number] Name:  Nancy Payne Sex:  female Age:  79 y.o. DOB:04-26-1922             Facility/Room; [facility] Level Of Care: INDEPENDENT   Emergency Contacts: Contact Information   Name Relation Home Work [place] Other [phone]   Aliea, Bobe  [phone]     Doyce Loose Daughter [phone]  [phone]      Code Status:Living Will. HC-POA: Dustin Flock Drinda Butts  Allergies: Allergies  Allergen Reactions  . Penicillins      Chief Complaint  Patient presents with  . Medical Managment of Chronic Issues    New patient     HPI: New transfer to [facility]. Prior PCP was Dr Ludwig Clarks.  Getting work done on her eyes. Sees Dr. [place] Bilberry in [place] Has Macular degeneration and cataracts.  HBP: controlled.  Repair of thoracic aortic aneurysm and Aortic valve replacement: sees Dr. Swaziland.  Past Medical History  Diagnosis Date  . Sick sinus syndrome     post DDD pacemaker  . Hypertension   . Edema   . Diabetes mellitus     type 2  . Hyperlipidemia   . Atrial fibrillation     permanent afib/aflutter  . Macular degeneration 2012    left   . Skin cancer of nose 2012    with skin graft  . Kidney disease, chronic, stage III (GFR 30-59 ml/min)   . Eczema     Past Surgical History  Procedure Laterality Date  . Pacemaker insertion  2005  . Cardiac valve surgery  2005    Dr. Barry Dienes  . Ankle surgery Right 1990    fracture as steel plate   . Knee surgery Right 1990    fracture has plate     Procedures:   Consultants: Cardiology: Swaziland Heart and chest surgery: Barry Dienes  Current Outpatient Prescriptions  Medication Sig Dispense Refill  . furosemide (LASIX) 40 MG tablet Take 1 tablet (40 mg total) by mouth as needed.  30 tablet  6  . glimepiride (AMARYL) 2 MG tablet Take 2 mg by mouth daily before breakfast.        . metoprolol succinate (TOPROL-XL) 25 MG 24 hr tablet Take 25 mg by mouth  2 (two) times daily.      . Rosuvastatin Calcium (CRESTOR PO) Take 10 mg by mouth daily.       Marland Kitchen warfarin (COUMADIN) 2.5 MG tablet Take as directed by coumadin clinic  70 tablet  3   No current facility-administered medications for this visit.    Immunization History  Administered Date(s) Administered  . Influenza Whole 09/02/2012  . Pneumococcal Polysaccharide 12/03/2002  . Td 12/03/2005     Diet: unrestricted  History  Substance Use Topics  . Smoking status: Never Smoker   . Smokeless tobacco: Never Used  . Alcohol Use: No    Family History  Problem Relation Age of Onset  . Stroke Mother   . Cancer Brother   . Anuerysm Sister      Review of Systems  Constitutional: Negative.   HENT: Negative for hearing loss, ear pain, neck pain and tinnitus.   Eyes:       Poor vision.   Respiratory: Negative for cough, hemoptysis, sputum production and shortness of breath.   Cardiovascular: Positive for leg swelling. Negative for chest pain, palpitations, orthopnea, claudication and PND.  Gastrointestinal: Negative for heartburn, nausea, vomiting, abdominal pain, diarrhea and constipation.  Genitourinary:  Negative for dysuria, urgency, frequency and flank pain.  Musculoskeletal: Negative for myalgias, back pain, joint pain and falls.  Skin:       Dry and scaling.  Neurological: Negative for dizziness, tingling, tremors, sensory change, speech change, focal weakness, seizures, loss of consciousness and headaches.  Endo/Heme/Allergies: Negative for environmental allergies and polydipsia. Does not bruise/bleed easily.  Psychiatric/Behavioral: Positive for memory loss. Negative for depression, suicidal ideas and substance abuse. The patient is not nervous/anxious and does not have insomnia.      Vital signs: BP 128/82  Pulse 68  Temp(Src) 96.8 F (36 C) (Oral)  Ht 5\' 3"  (1.6 m)  Wt 127 lb (57.607 kg)  BMI 22.5 kg/m2  Physical Exam  Constitutional: She is oriented to person,  place, and time. She appears well-developed. No distress.  HENT:  Head: Normocephalic and atraumatic.  Nose: Nose normal.  Wax occlusion in both EAC.  Eyes: Conjunctivae and EOM are normal. Pupils are equal, round, and reactive to light. Left eye exhibits no discharge. No scleral icterus.  Neck: Normal range of motion. Neck supple. No JVD present. No tracheal deviation present. No thyromegaly present.  Cardiovascular: Exam reveals no friction rub.   Murmur heard. 3/6 LSB SEM. Pacemaker in left upper chest.  Pulmonary/Chest: No respiratory distress. She has no wheezes. She has no rales.  Abdominal: Bowel sounds are normal. She exhibits no distension and no mass. There is no tenderness.  Musculoskeletal: Normal range of motion. She exhibits no edema and no tenderness.  Lymphadenopathy:    She has no cervical adenopathy.  Neurological: She is alert and oriented to person, place, and time. She has normal reflexes. No cranial nerve deficit. Coordination normal.  Skin: Skin is dry. No rash noted. No erythema. No pallor.  Scaly skin.  Psychiatric: She has a normal mood and affect. Her behavior is normal. Thought content normal.     Screening Score  MMS    PHQ2 0  PHQ9     Fall Risk    BIMS    Annual summary: Hospitalizations: last in 2005  Problem List as of 07/07/2013   AORTIC STENOSIS   ATRIAL FIBRILLATION   Last Assessment & Plan   01/23/2013 Office Visit Written 01/23/2013 10:17 PM by Hillis Range, MD     Permanent afib Will reprogram PPM VVIR  Continue coumadin    BRADYCARDIA   Last Assessment & Plan   01/23/2013 Office Visit Written 01/23/2013 10:16 PM by Hillis Range, MD     Normal pacemaker function See Pace Art report No changes today     ESSENTIAL HYPERTENSION, BENIGN   Last Assessment & Plan   11/16/2011 Office Visit Written 11/16/2011 11:55 AM by Hillis Range, MD     Stable No change required today    OTHER ACUTE PERICARDITIS     Infection History: no  significant Functional assessment: independent in all ADL Areas of potential improvement:None likely Prognosis for survival:good  Plan: Hypertension: controlled  Edema: chronic. Stable.  Hyperlipidemia:  Recheck lab prior to next visit  Macular degeneration; continue to see ophth.  Kidney disease, chronic, stage III (GFR 30-59 ml/min): unchanged

## 2013-07-13 DIAGNOSIS — Z7901 Long term (current) use of anticoagulants: Secondary | ICD-10-CM | POA: Diagnosis not present

## 2013-07-13 DIAGNOSIS — I4891 Unspecified atrial fibrillation: Secondary | ICD-10-CM | POA: Diagnosis not present

## 2013-07-14 ENCOUNTER — Telehealth: Payer: Self-pay | Admitting: Geriatric Medicine

## 2013-07-14 ENCOUNTER — Ambulatory Visit: Payer: Self-pay | Admitting: Pharmacist

## 2013-07-14 DIAGNOSIS — I4891 Unspecified atrial fibrillation: Secondary | ICD-10-CM

## 2013-07-31 LAB — REMOTE PACEMAKER DEVICE
BMOD-0002RV: 7
BRDY-0004RV: 130 {beats}/min
RV LEAD AMPLITUDE: 5.8 mv

## 2013-08-04 ENCOUNTER — Encounter: Payer: Self-pay | Admitting: *Deleted

## 2013-08-10 ENCOUNTER — Ambulatory Visit: Payer: Medicare Other | Admitting: *Deleted

## 2013-08-10 ENCOUNTER — Telehealth: Payer: Self-pay | Admitting: *Deleted

## 2013-08-10 DIAGNOSIS — Z7901 Long term (current) use of anticoagulants: Secondary | ICD-10-CM | POA: Diagnosis not present

## 2013-08-10 DIAGNOSIS — I4891 Unspecified atrial fibrillation: Secondary | ICD-10-CM | POA: Diagnosis not present

## 2013-08-10 NOTE — Telephone Encounter (Signed)
INR Results faxed from [facility] PT:  18.9 INR:  1.62  Current Dose of Coumadin:  5mg  M, T,W,F, Sat and 2.5 mg Sun., Thurs, Patient was being monitored at [facility] but now followed by Dr. Chilton Si.  Per Candelaria Celeste:  Keep 5mg  M,T,W,Fri. And Increase to 4 mg Sunday and Thursday and recheck in 1 week  Patient Notified and faxed to [facility]

## 2013-08-11 ENCOUNTER — Encounter: Payer: Medicare Other | Admitting: *Deleted

## 2013-08-12 ENCOUNTER — Other Ambulatory Visit: Payer: Self-pay | Admitting: *Deleted

## 2013-08-12 MED ORDER — WARFARIN SODIUM 2 MG PO TABS: ORAL_TABLET | ORAL | Status: DC

## 2013-08-17 DIAGNOSIS — Z7901 Long term (current) use of anticoagulants: Secondary | ICD-10-CM | POA: Diagnosis not present

## 2013-08-17 DIAGNOSIS — I4891 Unspecified atrial fibrillation: Secondary | ICD-10-CM | POA: Diagnosis not present

## 2013-08-18 ENCOUNTER — Telehealth: Payer: Self-pay | Admitting: *Deleted

## 2013-08-18 NOTE — Telephone Encounter (Signed)
Protime  08/17/2013  PT:  21.9 INR: 1.98  Current Dose of Coumadin:  5mg  M,T,W,Fri and 4 mg S and Thurs  Per Dr. Warner Mccreedy this to 5 mg by mouth daily and check INR 08/24/2013  08/18/2013--LMOM for patient to return call. Faxed results to [facility] at [facility] F#: [phone]

## 2013-08-19 LAB — REMOTE PACEMAKER DEVICE
BMOD-0004RV: 5
BRDY-0002RV: 60 {beats}/min
BRDY-0004RV: 130 {beats}/min
RV LEAD IMPEDENCE PM: 432 Ohm

## 2013-08-21 DIAGNOSIS — L608 Other nail disorders: Secondary | ICD-10-CM | POA: Diagnosis not present

## 2013-08-24 DIAGNOSIS — I4891 Unspecified atrial fibrillation: Secondary | ICD-10-CM | POA: Diagnosis not present

## 2013-08-24 DIAGNOSIS — Z7901 Long term (current) use of anticoagulants: Secondary | ICD-10-CM | POA: Diagnosis not present

## 2013-08-24 LAB — PROTIME-INR: INR: 2.2 — AB (ref 0.9–1.1)

## 2013-08-25 ENCOUNTER — Other Ambulatory Visit: Payer: Self-pay | Admitting: Internal Medicine

## 2013-08-28 ENCOUNTER — Encounter: Payer: Self-pay | Admitting: *Deleted

## 2013-08-31 DIAGNOSIS — I4891 Unspecified atrial fibrillation: Secondary | ICD-10-CM | POA: Diagnosis not present

## 2013-08-31 DIAGNOSIS — Z7901 Long term (current) use of anticoagulants: Secondary | ICD-10-CM | POA: Diagnosis not present

## 2013-08-31 LAB — PROTIME-INR: INR: 3 — AB (ref 0.9–1.1)

## 2013-09-01 ENCOUNTER — Telehealth: Payer: Self-pay | Admitting: *Deleted

## 2013-09-01 NOTE — Telephone Encounter (Signed)
08/31/2013--Protime drawn at [facility]  PT: 29.4 INR: 2.95  Current Dose of Coumadin:  5mg  everyday  Per Tora Perches on 5 mg and recheck in 1 week. Patient Notified and faxed back to [facility] at [facility]

## 2013-09-02 ENCOUNTER — Encounter: Payer: Self-pay | Admitting: Internal Medicine

## 2013-09-05 ENCOUNTER — Encounter: Payer: Self-pay | Admitting: Internal Medicine

## 2013-09-07 ENCOUNTER — Encounter: Payer: Self-pay | Admitting: Internal Medicine

## 2013-09-07 ENCOUNTER — Telehealth: Payer: Self-pay | Admitting: *Deleted

## 2013-09-07 DIAGNOSIS — Z7901 Long term (current) use of anticoagulants: Secondary | ICD-10-CM | POA: Diagnosis not present

## 2013-09-07 DIAGNOSIS — I4891 Unspecified atrial fibrillation: Secondary | ICD-10-CM | POA: Diagnosis not present

## 2013-09-07 NOTE — Telephone Encounter (Signed)
09/07/2013 Protime drawn at [facility]  PT: 37.5 INR: 4.09  Current Dose of Coumadin 5 mg everyday  Per Dr. Renato Gails:  Return to 5 mg daily except 4mg  S/TH and recheck in 1 week. Patient Notified and faxed results to [facility]

## 2013-09-08 ENCOUNTER — Encounter: Payer: Self-pay | Admitting: Internal Medicine

## 2013-09-14 ENCOUNTER — Encounter: Payer: Medicare Other | Admitting: *Deleted

## 2013-09-14 ENCOUNTER — Other Ambulatory Visit: Payer: Self-pay | Admitting: Internal Medicine

## 2013-09-14 DIAGNOSIS — I4891 Unspecified atrial fibrillation: Secondary | ICD-10-CM | POA: Diagnosis not present

## 2013-09-14 DIAGNOSIS — Z7901 Long term (current) use of anticoagulants: Secondary | ICD-10-CM | POA: Diagnosis not present

## 2013-09-14 LAB — PROTIME-INR

## 2013-09-14 NOTE — Telephone Encounter (Signed)
Protime from [facility] on 09/14/2013:  PT:  31.7 INR: 3.26  Current Dose of Coumadin:  5mg  daily except 4mg  Sunday and Thursday  Per Dr. Renato Gails: Hold Coumadin today 10/13 and tomorrow 10/14, restart at 4mg  on Wed., 10/15 and Recheck INR on Thursday (regular INR day) I called patient and she stated that she has already taken her Coumadin for today, so Dr. Renato Gails said to HOLD Coumadin Tuesday and Wednesday and restart Coumadin on Thursday and have it rechecked that Thursday. Patient Notified and faxed results to [facility] at [facility] Fax#: [phone]

## 2013-09-16 ENCOUNTER — Telehealth: Payer: Self-pay | Admitting: *Deleted

## 2013-09-16 NOTE — Telephone Encounter (Signed)
Protime Results from [facility]   PT:  31.7 INR: 3.26  Current Dose of Coumadin: 5mg  daily except 4mg  S/Th.  Per Laverda Page Coumadin today and resume 4mg  daily and recheck in 1 week. Patient Notified and faxed back to [facility] at [facility]

## 2013-09-21 ENCOUNTER — Telehealth: Payer: Self-pay | Admitting: *Deleted

## 2013-09-21 DIAGNOSIS — Z7901 Long term (current) use of anticoagulants: Secondary | ICD-10-CM | POA: Diagnosis not present

## 2013-09-21 DIAGNOSIS — I4891 Unspecified atrial fibrillation: Secondary | ICD-10-CM | POA: Diagnosis not present

## 2013-09-21 NOTE — Telephone Encounter (Signed)
[facility] Protime results from [facility]  PT:  21.8 INR: 1.97  Current Dose of Coumadin: 4mg  daily  Per Nancy Payne: Keep current dose of Coumadin and recheck one week.  Patient Notified and faxed results to [facility].

## 2013-09-24 ENCOUNTER — Encounter: Payer: Self-pay | Admitting: Internal Medicine

## 2013-09-28 ENCOUNTER — Encounter: Payer: Self-pay | Admitting: *Deleted

## 2013-09-28 ENCOUNTER — Ambulatory Visit (INDEPENDENT_AMBULATORY_CARE_PROVIDER_SITE_OTHER): Payer: Medicare Other | Admitting: *Deleted

## 2013-09-28 DIAGNOSIS — Z7901 Long term (current) use of anticoagulants: Secondary | ICD-10-CM | POA: Diagnosis not present

## 2013-09-28 DIAGNOSIS — I495 Sick sinus syndrome: Secondary | ICD-10-CM

## 2013-09-28 DIAGNOSIS — I4891 Unspecified atrial fibrillation: Secondary | ICD-10-CM | POA: Diagnosis not present

## 2013-09-29 ENCOUNTER — Telehealth: Payer: Self-pay | Admitting: *Deleted

## 2013-09-29 NOTE — Telephone Encounter (Signed)
09/28/2013 Protime Results from [facility]  PT:  25.5 INR: 2.43  Current Dose of Coumadin:  4mg  daily Per Dr. Chilton Si:  Continue 4mg  of Coumadin daily and repeat in 2 weeks  09/29/2013--Patient Notified and faxed to [facility]

## 2013-10-02 ENCOUNTER — Encounter: Payer: Self-pay | Admitting: Internal Medicine

## 2013-10-05 LAB — REMOTE PACEMAKER DEVICE
BATTERY VOLTAGE: 2.88 V
BMOD-0002RV: 7
BMOD-0003RV: 30
BRDY-0002RV: 60 {beats}/min
BRDY-0004RV: 130 {beats}/min
RV LEAD AMPLITUDE: 5.8 mv
RV LEAD IMPEDENCE PM: 440 Ohm
VENTRICULAR PACING PM: 11.62

## 2013-10-06 ENCOUNTER — Encounter: Payer: Self-pay | Admitting: Internal Medicine

## 2013-10-08 NOTE — Progress Notes (Signed)
Remote pacer received  

## 2013-10-08 NOTE — Addendum Note (Signed)
Addended by: Donalynn Furlong on: 10/08/2013 11:24 AM   Modules accepted: Level of Service

## 2013-10-12 ENCOUNTER — Encounter (HOSPITAL_BASED_OUTPATIENT_CLINIC_OR_DEPARTMENT_OTHER): Payer: Medicare Other | Attending: General Surgery

## 2013-10-12 ENCOUNTER — Encounter: Payer: Self-pay | Admitting: *Deleted

## 2013-10-12 DIAGNOSIS — Z7901 Long term (current) use of anticoagulants: Secondary | ICD-10-CM | POA: Diagnosis not present

## 2013-10-12 DIAGNOSIS — I872 Venous insufficiency (chronic) (peripheral): Secondary | ICD-10-CM | POA: Insufficient documentation

## 2013-10-12 DIAGNOSIS — I509 Heart failure, unspecified: Secondary | ICD-10-CM | POA: Insufficient documentation

## 2013-10-12 DIAGNOSIS — I10 Essential (primary) hypertension: Secondary | ICD-10-CM | POA: Diagnosis not present

## 2013-10-12 DIAGNOSIS — Z950 Presence of cardiac pacemaker: Secondary | ICD-10-CM | POA: Insufficient documentation

## 2013-10-12 DIAGNOSIS — I4891 Unspecified atrial fibrillation: Secondary | ICD-10-CM | POA: Insufficient documentation

## 2013-10-12 DIAGNOSIS — L97809 Non-pressure chronic ulcer of other part of unspecified lower leg with unspecified severity: Secondary | ICD-10-CM | POA: Diagnosis not present

## 2013-10-13 ENCOUNTER — Telehealth: Payer: Self-pay

## 2013-10-13 NOTE — Telephone Encounter (Signed)
Spoke with Mablu at [facility] (late yesterday) gave instructions to have patient continue current dose of 4 mg daily and recheck PT/INR in 1 week (Per Dr.Reed). PT/INR value was 2.72/27.7

## 2013-10-13 NOTE — Progress Notes (Signed)
[facility]  NAME:  Nancy Payne, Nancy Payne NO.:  [id number]  MEDICAL RECORD NO.:  [id number]      DATE OF BIRTH:  06/29/1922  PHYSICIAN:  Ardath Sax, M.D.           VISIT DATE:                                  OFFICE VISIT   She is an 79 year old lady, who comes to [place] with venous stasis ulcers. She has had bilateral venous stasis ulcers that have been successfully treated with Unna boots.  Today, she just has ulcer on her right leg that is a couple of cm in diameter and we are going to treat it with silver collagen and an Radio broadcast assistant.  This lady has many medical problems including atrial fibrillation, congestive heart failure, hypertension. She is on Coumadin and metoprolol.  She also has a pacemaker.  We found her to have a temperature of 98, pulse of 80, respirations 16, her blood pressure is 130/85.  She also has type 2 diabetes and she has been treating herself with compression hose apparently as she has not been wearing them enough lately and she has got a recurrence of venous stasis ulcers on the right leg.  So we put the collagen on and an Unna boot and I think it should clear up very nicely.     Ardath Sax, M.D.     PP/MEDQ  D:  10/12/2013  T:  10/13/2013  Job:  [id number]

## 2013-10-15 ENCOUNTER — Encounter: Payer: Self-pay | Admitting: Internal Medicine

## 2013-10-16 ENCOUNTER — Encounter: Payer: Self-pay | Admitting: Internal Medicine

## 2013-10-19 ENCOUNTER — Telehealth: Payer: Self-pay | Admitting: *Deleted

## 2013-10-19 DIAGNOSIS — I509 Heart failure, unspecified: Secondary | ICD-10-CM | POA: Diagnosis not present

## 2013-10-19 DIAGNOSIS — Z7901 Long term (current) use of anticoagulants: Secondary | ICD-10-CM | POA: Diagnosis not present

## 2013-10-19 DIAGNOSIS — L97809 Non-pressure chronic ulcer of other part of unspecified lower leg with unspecified severity: Secondary | ICD-10-CM | POA: Diagnosis not present

## 2013-10-19 DIAGNOSIS — I4891 Unspecified atrial fibrillation: Secondary | ICD-10-CM | POA: Diagnosis not present

## 2013-10-19 DIAGNOSIS — I872 Venous insufficiency (chronic) (peripheral): Secondary | ICD-10-CM | POA: Diagnosis not present

## 2013-10-19 NOTE — Telephone Encounter (Signed)
Protime Results from 10/19/2013:   PT: 30.5, INR:  3.10   Current dose of Coumadin is 4mg  daily Per Wyatt Portela:  Hold today, then start 4mg  everyday and recheck in one week. Patient Notified and faxed to [facility]

## 2013-10-22 ENCOUNTER — Ambulatory Visit (INDEPENDENT_AMBULATORY_CARE_PROVIDER_SITE_OTHER): Payer: Medicare Other | Admitting: Nurse Practitioner

## 2013-10-22 ENCOUNTER — Encounter: Payer: Self-pay | Admitting: Nurse Practitioner

## 2013-10-22 VITALS — BP 124/70 | HR 80 | Temp 97.4°F | Resp 16 | Wt 143.0 lb

## 2013-10-22 DIAGNOSIS — R609 Edema, unspecified: Secondary | ICD-10-CM

## 2013-10-22 NOTE — Patient Instructions (Signed)
Try to sleep in the bed; laying down Increase protein Elevate legs and left arm May try compression to arm

## 2013-10-22 NOTE — Progress Notes (Signed)
Patient ID: Nancy Payne, female   DOB: 22-Jan-1922, 79 y.o.   MRN: [id number]   Allergies  Allergen Reactions  . Penicillins     Chief Complaint  Patient presents with  . Hand Pain    LT hand & arm swollen x 2-3 weeks ago, she is taking Lasix daily    HPI: Patient is a 79 y.o. female seen in the office today for swollen hand and arm, also has swelling in lower extremities. Fluid blisters on back on left leg and has had to follow with wound clinic; daughter reports she does not eat good; "eats like a bird" has tried boost but does not drink that much due to the fact it makes her blood sugar high No injury to arm, no pain doesn't achy, no redness or warms Weight 143.  Has slept in a recliner for over a year  Review of Systems:  Review of Systems  Constitutional: Negative for fever, chills and weight loss.  Respiratory: Negative for cough and shortness of breath.   Cardiovascular: Positive for leg swelling. Negative for chest pain.  Gastrointestinal: Negative for diarrhea and constipation.  Genitourinary: Negative for dysuria, urgency and frequency.  Musculoskeletal: Negative for back pain, joint pain and myalgias.     Past Medical History  Diagnosis Date  . Sick sinus syndrome     post DDD pacemaker  . Hypertension   . Edema   . Type II or unspecified type diabetes mellitus without mention of complication, not stated as uncontrolled     type 2  . Hyperlipidemia   . Atrial fibrillation     permanent afib/aflutter  . Macular degeneration 2012    left   . Skin cancer of nose 2012    with skin graft  . Kidney disease, chronic, stage III (GFR 30-59 ml/min)   . Eczema   . Xerostomia 2012  . Xerophthalmia 2012  . Brain atrophy 2009   Past Surgical History  Procedure Laterality Date  . Pacemaker insertion  2005  . Cardiac valve surgery  2005    Dr. Barry Dienes  . Ankle surgery Right 1990    fracture as steel plate   . Knee surgery Right 1990    fracture has plate and rod  in femur  . Colonoscopy  2008   Social History:   reports that she has never smoked. She has never used smokeless tobacco. She reports that she does not drink alcohol or use illicit drugs.  Family History  Problem Relation Age of Onset  . Stroke Mother   . Cancer Brother   . Anuerysm Sister     Medications: Patient's Medications  New Prescriptions   No medications on file  Previous Medications   CRESTOR 10 MG TABLET    take 1 tablet by mouth once daily   FUROSEMIDE (LASIX) 40 MG TABLET    Take 1 tablet (40 mg total) by mouth as needed.   GLIMEPIRIDE (AMARYL) 2 MG TABLET    take 1 tablet once daily   METOPROLOL SUCCINATE (TOPROL-XL) 25 MG 24 HR TABLET    Take 25 mg by mouth 2 (two) times daily.   METOPROLOL TARTRATE (LOPRESSOR) 25 MG TABLET    take 1 tablet by mouth twice a day   ROSUVASTATIN CALCIUM (CRESTOR PO)    Take 10 mg by mouth daily.    WARFARIN (COUMADIN) 2 MG TABLET    Take as Directed   WARFARIN (COUMADIN) 2.5 MG TABLET    Take as directed  by coumadin clinic  Modified Medications   No medications on file  Discontinued Medications   No medications on file     Physical Exam:  Filed Vitals:   10/22/13 0846  BP: 124/70  Pulse: 80  Temp: 97.4 F (36.3 C)  TempSrc: Oral  Resp: 16  Weight: 123 lb 6.4 oz (55.974 kg)  SpO2: 95%    Physical Exam  Vitals reviewed. Constitutional: No distress.  Neck: Normal range of motion. Neck supple. No JVD present.  Cardiovascular: Normal rate.   Murmur heard. Pulmonary/Chest: Effort normal and breath sounds normal. No respiratory distress. She has no wheezes.  Abdominal: Soft. Bowel sounds are normal. She exhibits no distension. There is no tenderness.  Musculoskeletal: Normal range of motion. She exhibits edema. She exhibits no tenderness.  3+pitting edema to left arm from elbow down through fingers  Legs are wrapped bilaterally   Neurological: She is alert.  Skin: Skin is warm and dry. She is not diaphoretic. No  erythema.     Labs reviewed: Basic Metabolic Panel:  Recent Labs  42/59/56 1627  NA 140  K 3.7  CL 101  CO2 28  GLUCOSE 169*  BUN 46*  CREATININE 1.48*  CALCIUM 8.7  TSH 2.285   Liver Function Tests: No results found for this basename: AST, ALT, ALKPHOS, BILITOT, PROT, ALBUMIN,  in the last 8760 hours No results found for this basename: LIPASE, AMYLASE,  in the last 8760 hours No results found for this basename: AMMONIA,  in the last 8760 hours CBC:  Recent Labs  03/06/13 1627  WBC 6.8  NEUTROABS 4.1  HGB 12.2  HCT 38.0  MCV 90.7  PLT 222   Lipid Panel: No results found for this basename: CHOL, HDL, LDLCALC, TRIG, CHOLHDL, LDLDIRECT,  in the last 8760 hours TSH:  Recent Labs  03/06/13 1627  TSH 2.285   A1C: No results found for this basename: HGBA1C     Assessment/Plan 1. Edema -to sleep laying down if she can tolerate this (daughter reports she sleep in her recliner due to the fact her husband died 1 year ago and she does not like being in the bedroom) -elevate arm -increase protein intake -diabetic ensures twice daily between meals  -to follow up if this gets worse, skin breakdown occurs - Comprehensive metabolic panel - Albumin - CBC With differential/Platelet  Keep follow up with Chilton Si, MD

## 2013-10-22 NOTE — Addendum Note (Signed)
Addended by: Waymond Cera on: 10/22/2013 09:52 AM   Modules accepted: Orders

## 2013-10-23 ENCOUNTER — Encounter: Payer: Self-pay | Admitting: Internal Medicine

## 2013-10-23 LAB — COMPREHENSIVE METABOLIC PANEL
AST: 19 IU/L (ref 0–40)
Albumin/Globulin Ratio: 1.6 (ref 1.1–2.5)
Albumin: 3.5 g/dL (ref 3.5–4.7)
Alkaline Phosphatase: 87 IU/L (ref 39–117)
BUN/Creatinine Ratio: 19 (ref 11–26)
Calcium: 8.8 mg/dL (ref 8.6–10.2)
Creatinine, Ser: 1.24 mg/dL — ABNORMAL HIGH (ref 0.57–1.00)
GFR calc non Af Amer: 39 mL/min/{1.73_m2} — ABNORMAL LOW (ref >59)
Globulin, Total: 2.2 g/dL (ref 1.5–4.5)
Potassium: 3.8 mmol/L (ref 3.5–5.2)
Sodium: 144 mmol/L (ref 134–144)
Total Bilirubin: 0.7 mg/dL (ref 0.0–1.2)

## 2013-10-23 LAB — CBC WITH DIFFERENTIAL
Basophils Absolute: 0 10*3/uL (ref 0.0–0.2)
Basos: 1 %
Eos: 3 %
Eosinophils Absolute: 0.2 10*3/uL (ref 0.0–0.4)
Hemoglobin: 12.4 g/dL (ref 11.1–15.9)
Immature Grans (Abs): 0 10*3/uL (ref 0.0–0.1)
Immature Granulocytes: 0 %
Lymphocytes Absolute: 1.2 10*3/uL (ref 0.7–3.1)
MCH: 28.1 pg (ref 26.6–33.0)
MCHC: 32.5 g/dL (ref 31.5–35.7)
MCV: 86 fL (ref 79–97)
Monocytes Absolute: 0.6 10*3/uL (ref 0.1–0.9)
Neutrophils Absolute: 4.6 10*3/uL (ref 1.4–7.0)
Neutrophils Relative %: 69 %
Platelets: 162 10*3/uL (ref 150–379)
RBC: 4.41 x10E6/uL (ref 3.77–5.28)
RDW: 15.8 % — ABNORMAL HIGH (ref 12.3–15.4)

## 2013-10-26 ENCOUNTER — Ambulatory Visit (INDEPENDENT_AMBULATORY_CARE_PROVIDER_SITE_OTHER): Payer: Medicare Other | Admitting: *Deleted

## 2013-10-26 DIAGNOSIS — L97809 Non-pressure chronic ulcer of other part of unspecified lower leg with unspecified severity: Secondary | ICD-10-CM | POA: Diagnosis not present

## 2013-10-26 DIAGNOSIS — I509 Heart failure, unspecified: Secondary | ICD-10-CM | POA: Diagnosis not present

## 2013-10-26 DIAGNOSIS — I10 Essential (primary) hypertension: Secondary | ICD-10-CM | POA: Diagnosis not present

## 2013-10-26 DIAGNOSIS — I4891 Unspecified atrial fibrillation: Secondary | ICD-10-CM | POA: Diagnosis not present

## 2013-10-26 DIAGNOSIS — I872 Venous insufficiency (chronic) (peripheral): Secondary | ICD-10-CM | POA: Diagnosis not present

## 2013-10-26 DIAGNOSIS — Z7901 Long term (current) use of anticoagulants: Secondary | ICD-10-CM | POA: Diagnosis not present

## 2013-10-27 LAB — MDC_IDC_ENUM_SESS_TYPE_REMOTE
Battery Voltage: 2.86 V
Date Time Interrogation Session: 20141125000656
Lead Channel Impedance Value: 368 Ohm
Lead Channel Impedance Value: 424 Ohm
Lead Channel Setting Sensing Sensitivity: 0.9 mV
Zone Setting Detection Interval: 350 ms
Zone Setting Detection Interval: 400 ms

## 2013-11-02 ENCOUNTER — Encounter (HOSPITAL_BASED_OUTPATIENT_CLINIC_OR_DEPARTMENT_OTHER): Payer: Medicare Other | Attending: General Surgery

## 2013-11-02 ENCOUNTER — Encounter: Payer: Self-pay | Admitting: Internal Medicine

## 2013-11-02 DIAGNOSIS — I87319 Chronic venous hypertension (idiopathic) with ulcer of unspecified lower extremity: Secondary | ICD-10-CM | POA: Insufficient documentation

## 2013-11-02 DIAGNOSIS — E785 Hyperlipidemia, unspecified: Secondary | ICD-10-CM | POA: Diagnosis not present

## 2013-11-02 DIAGNOSIS — I4891 Unspecified atrial fibrillation: Secondary | ICD-10-CM | POA: Diagnosis not present

## 2013-11-02 DIAGNOSIS — L97909 Non-pressure chronic ulcer of unspecified part of unspecified lower leg with unspecified severity: Secondary | ICD-10-CM | POA: Insufficient documentation

## 2013-11-02 DIAGNOSIS — E119 Type 2 diabetes mellitus without complications: Secondary | ICD-10-CM | POA: Insufficient documentation

## 2013-11-02 DIAGNOSIS — I10 Essential (primary) hypertension: Secondary | ICD-10-CM | POA: Diagnosis not present

## 2013-11-02 LAB — HEPATIC FUNCTION PANEL
ALT: 10 U/L (ref 7–35)
AST: 19 U/L (ref 13–35)
Alkaline Phosphatase: 85 U/L (ref 25–125)
Bilirubin, Total: 1.1 mg/dL

## 2013-11-02 LAB — TSH: TSH: 3.23 u[IU]/mL (ref 0.41–5.90)

## 2013-11-02 LAB — BASIC METABOLIC PANEL
Creatinine: 1.3 mg/dL — AB (ref 0.5–1.1)
Glucose: 220 mg/dL
Potassium: 3.9 mmol/L (ref 3.4–5.3)

## 2013-11-06 ENCOUNTER — Encounter: Payer: Self-pay | Admitting: *Deleted

## 2013-11-06 ENCOUNTER — Other Ambulatory Visit: Payer: Self-pay | Admitting: Internal Medicine

## 2013-11-09 DIAGNOSIS — I87319 Chronic venous hypertension (idiopathic) with ulcer of unspecified lower extremity: Secondary | ICD-10-CM | POA: Diagnosis not present

## 2013-11-09 DIAGNOSIS — E119 Type 2 diabetes mellitus without complications: Secondary | ICD-10-CM | POA: Diagnosis not present

## 2013-11-09 LAB — GLUCOSE, CAPILLARY: Glucose-Capillary: 166 mg/dL — ABNORMAL HIGH (ref 70–99)

## 2013-11-10 ENCOUNTER — Non-Acute Institutional Stay: Payer: Medicare Other | Admitting: Internal Medicine

## 2013-11-10 ENCOUNTER — Encounter: Payer: Self-pay | Admitting: Internal Medicine

## 2013-11-10 VITALS — BP 120/74 | HR 68 | Wt 141.0 lb

## 2013-11-10 DIAGNOSIS — R609 Edema, unspecified: Secondary | ICD-10-CM | POA: Diagnosis not present

## 2013-11-10 DIAGNOSIS — I10 Essential (primary) hypertension: Secondary | ICD-10-CM

## 2013-11-10 DIAGNOSIS — N183 Chronic kidney disease, stage 3 unspecified: Secondary | ICD-10-CM

## 2013-11-10 MED ORDER — SPIRONOLACTONE 25 MG PO TABS: ORAL_TABLET | ORAL | Status: DC

## 2013-11-10 NOTE — Progress Notes (Signed)
Subjective:    Patient ID: Nancy Payne, female    DOB: 1922/03/24, 79 y.o.   MRN: [id number]  Chief Complaint  Patient presents with  . Medical Managment of Chronic Issues    blood pressure, edema, cholesterol, CKD    HPI Continued problem of swelling in the left arm. [facility] with history of about 3 weeks of edema of the arm on 10/22/13. Painless. No injury. Got a little better on 80mg  qd of furosemide, but renal function is impaired and she has resumed 40mg  qd. Has pacemaker implant on the left side. Right arm is normal.  Blisters on the legs are being seen at wound care. They are improving.She has compression wraps.  Current Outpatient Prescriptions on File Prior to Visit  Medication Sig Dispense Refill  . CRESTOR 10 MG tablet take 1 tablet by mouth once daily  30 tablet  3  . furosemide (LASIX) 40 MG tablet take 1 tablet by mouth if needed  30 tablet  6  . glimepiride (AMARYL) 2 MG tablet take 1 tablet once daily  30 tablet  2  . metoprolol succinate (TOPROL-XL) 25 MG 24 hr tablet Take 25 mg by mouth 2 (two) times daily.      . metoprolol tartrate (LOPRESSOR) 25 MG tablet take 1 tablet by mouth twice a day  60 tablet  3  . Rosuvastatin Calcium (CRESTOR PO) Take 10 mg by mouth daily.       Marland Kitchen warfarin (COUMADIN) 2 MG tablet Take as Directed  60 tablet  3  . warfarin (COUMADIN) 2.5 MG tablet Take as directed by coumadin clinic  70 tablet  3   No current facility-administered medications on file prior to visit.    Review of Systems     Objective:   Physical Exam  Constitutional: She is oriented to person, place, and time. She appears well-developed. No distress.  HENT:  Head: Normocephalic and atraumatic.  Nose: Nose normal.  Wax occlusion in both EAC.  Eyes: Conjunctivae and EOM are normal. Pupils are equal, round, and reactive to light. Left eye exhibits no discharge. No scleral icterus.  Neck: Normal range of motion. Neck supple. No JVD present. No tracheal  deviation present. No thyromegaly present.  Cardiovascular: Exam reveals no friction rub.   Murmur heard. 3/6 LSB SEM. Pacemaker in left upper chest.  Pulmonary/Chest: No respiratory distress. She has no wheezes. She has no rales.  Abdominal: Bowel sounds are normal. She exhibits no distension and no mass. There is no tenderness.  Musculoskeletal: Normal range of motion. She exhibits edema. She exhibits no tenderness.  Edematous legs bilaterally. Left arm has 2-3+ edema beginning above the elbow. No adenopathy or breast lumps. Pacer is in left chest.  Lymphadenopathy:    She has no cervical adenopathy.  Neurological: She is alert and oriented to person, place, and time. She has normal reflexes. No cranial nerve deficit. Coordination normal.  Skin: Skin is dry. No rash noted. No erythema. No pallor.  Blisters on the legs are covered in compression stocking  Psychiatric: She has a normal mood and affect. Her behavior is normal. Thought content normal.      Nursing Home on 11/10/2013  Component Date Value Range Status  . Glucose 11/02/2013 220   Final  . BUN 11/02/2013 33* 4 - 21 mg/dL Final  . Creatinine 08/65/7846 1.3* 0.5 - 1.1 mg/dL Final  . Potassium 96/29/5284 3.9  3.4 - 5.3 mmol/L Final  . Sodium 11/02/2013 140  137 - 147 mmol/L Final  . Triglycerides 11/02/2013 142  40 - 160 mg/dL Final  . Cholesterol 16/09/9603 103  0 - 200 mg/dL Final  . HDL 54/08/8118 40  35 - 70 mg/dL Final  . LDL Cholesterol 11/02/2013 35   Final  . Alkaline Phosphatase 11/02/2013 85  25 - 125 U/L Final  . ALT 11/02/2013 10  7 - 35 U/L Final  . AST 11/02/2013 19  13 - 35 U/L Final  . Bilirubin, Total 11/02/2013 1.1   Final  . TSH 11/02/2013 3.23  0.41 - 5.90 uIU/mL Final  Office Visit on 11/02/2013  Component Date Value Range Status  . Glucose-Capillary 11/09/2013 166* 70 - 99 mg/dL Final  . Comment 1 14/78/2956 Notify RN   Final  Clinical Support on 10/26/2013  Component Date Value Range Status  .  Date Time Interrogation Session 10/27/2013 [id number]   Final  . Pulse Generator Manufacturer 10/27/2013 Medtronic   Final  . Pulse Gen Model 10/27/2013 [id number] EnRhythm   Final  . Pulse Gen Serial Number 10/27/2013 [id number]   Final  . RV Sense Sensitivity 10/27/2013 0.9   Final  . RV Pace PulseWidth 10/27/2013 0.4   Final  . RV Pace Amplitude 10/27/2013 2.5   Final  . Zone Setting Type Category 10/27/2013 VF   Final  . Zone Setting Type Category 10/27/2013 VT   Final  . Zone Setting Type Category 10/27/2013 VENTRICULAR_TACHYCARDIA_1   Final  . Zone Setting Type Category 10/27/2013 VENTRICULAR_TACHYCARDIA_2   Final  . Zone Detect Interval 10/27/2013 400   Final  . Zone Setting Type Category 10/27/2013 ATRIAL_FIBRILLATION   Final  . Zone Setting Type Category 10/27/2013 ATAF   Final  . Zone Detect Interval 10/27/2013 350   Final  . RA Impedance 10/27/2013 368   Final  . RV IMPEDANCE 10/27/2013 424   Final  . RV Amplitude 10/27/2013 5.45792   Final  . Battery Status 10/27/2013 OK   Final  . Battery Voltage 10/27/2013 2.86   Final  . Huston Foley RV Perc Paced 10/27/2013 9.45   Final  . Eval Rhythm 10/27/2013 AF w/VS   Final  . Miscellaneous Comment 10/27/2013 Pacemaker remote received. Battery voltage 2.86 V--monthly checks. Pt in AF 100% of time. + Warfarin. Normal device function. [facility] 11-30-13 and ROV in February with JA.   Final  Scanned Document on 10/23/2013  Component Date Value Range Status  . INR 10/12/2013 2.7* 0.9 - 1.1 Final  Office Visit on 10/22/2013  Component Date Value Range Status  . Glucose 10/22/2013 170* 65 - 99 mg/dL Final  . BUN 40/09/2724 24  8 - 27 mg/dL Final  . Creatinine, Ser 10/22/2013 1.24* 0.57 - 1.00 mg/dL Final  . GFR calc non Af Amer 10/22/2013 39* >59 mL/min/1.73 Final  . GFR calc Af Amer 10/22/2013 45* >59 mL/min/1.73 Final  . BUN/Creatinine Ratio 10/22/2013 19  11 - 26 Final  . Sodium 10/22/2013 144  134 - 144 mmol/L Final  . Potassium  10/22/2013 3.8  3.5 - 5.2 mmol/L Final  . Chloride 10/22/2013 99  97 - 108 mmol/L Final  . CO2 10/22/2013 29  18 - 29 mmol/L Final  . Calcium 10/22/2013 8.8  8.6 - 10.2 mg/dL Final  . Total Protein 10/22/2013 5.7* 6.0 - 8.5 g/dL Final  . Albumin 36/64/4034 3.5  3.5 - 4.7 g/dL Final  . Globulin, Total 10/22/2013 2.2  1.5 - 4.5 g/dL Final  . Albumin/Globulin Ratio 10/22/2013 1.6  1.1 -  2.5 Final  . Total Bilirubin 10/22/2013 0.7  0.0 - 1.2 mg/dL Final  . Alkaline Phosphatase 10/22/2013 87  39 - 117 IU/L Final  . AST 10/22/2013 19  0 - 40 IU/L Final  . ALT 10/22/2013 9  0 - 32 IU/L Final  . WBC 10/22/2013 6.5  3.4 - 10.8 x10E3/uL Final  . RBC 10/22/2013 4.41  3.77 - 5.28 x10E6/uL Final  . Hemoglobin 10/22/2013 12.4  11.1 - 15.9 g/dL Final  . HCT 45/40/9811 38.1  34.0 - 46.6 % Final  . MCV 10/22/2013 86  79 - 97 fL Final  . MCH 10/22/2013 28.1  26.6 - 33.0 pg Final  . MCHC 10/22/2013 32.5  31.5 - 35.7 g/dL Final  . RDW 91/47/8295 15.8* 12.3 - 15.4 % Final  . Platelets 10/22/2013 162  150 - 379 x10E3/uL Final  . Neutrophils Relative % 10/22/2013 69   Final  . Lymphs 10/22/2013 18   Final  . Monocytes 10/22/2013 9   Final  . Eos 10/22/2013 3   Final  . Basos 10/22/2013 1   Final  . Neutrophils Absolute 10/22/2013 4.6  1.4 - 7.0 x10E3/uL Final  . Lymphocytes Absolute 10/22/2013 1.2  0.7 - 3.1 x10E3/uL Final  . Monocytes Absolute 10/22/2013 0.6  0.1 - 0.9 x10E3/uL Final  . Eosinophils Absolute 10/22/2013 0.2  0.0 - 0.4 x10E3/uL Final  . Basophils Absolute 10/22/2013 0.0  0.0 - 0.2 x10E3/uL Final  . Immature Granulocytes 10/22/2013 0   Final  . Immature Grans (Abs) 10/22/2013 0.0  0.0 - 0.1 x10E3/uL Final  . Prealbumin 10/22/2013 14* 20 - 40 mg/dL Final  Clinical Support on 09/28/2013  Component Date Value Range Status  . DEVICE MODEL PM 09/28/2013 [id number]   Final-Edited  . DEV-0014LDO 09/28/2013 Rosine Abe     Final-Edited  . DEV-0014LDO 09/28/2013 Rosine Abe      Final-Edited  . EVAL-0005E5 09/28/2013 [employer] [facility]   Final-Edited  . [id number] 09/28/2013 Pacemaker remote received for battery check. Battery voltage 2.88 V. Pt in AF 100% of time. + Warfarin. [facility] 10-26-13 and ROV in February with JA.   Final-Edited  . VENTRICULAR PACING PM 09/28/2013 11.62   Final-Edited  . BATTERY VOLTAGE 09/28/2013 2.88   Final-Edited  . AL IMPEDENCE PM 09/28/2013 392   Final-Edited  . RV LEAD IMPEDENCE PM 09/28/2013 440   Final-Edited  . RV LEAD AMPLITUDE 09/28/2013 5.8   Final-Edited  . BRDY-0001RV 09/28/2013 VVIR   Final-Edited  . BRDY-0002RV 09/28/2013 60   Final-Edited  . BRDY-0004RV 09/28/2013 130   Final-Edited  . BRDY-0005RV 09/28/2013 /   Final-Edited  . BRDY-0010RV 09/28/2013 MVP Off   Final-Edited  . BMOD-0001RV 09/28/2013 Medium Low   Final-Edited  . BMOD-0002RV 09/28/2013 7   Final-Edited  . BMOD-0003RV 09/28/2013 30 sec   Final-Edited  . BMOD-0004RV 09/28/2013 5 min   Final-Edited  Scanned Document on 09/24/2013  Component Date Value Range Status  . INR 08/31/2013 3.0* 0.9 - 1.1 Final  Scanned Document on 09/24/2013  Component Date Value Range Status  . INR 09/07/2013 4.1* 0.9 - 1.1 Final  Scanned Document on 09/07/2013  Component Date Value Range Status  . INR 08/17/2013 2.0* 0.9 - 1.1 Final  Scanned Document on 09/07/2013  Component Date Value Range Status  . INR 08/24/2013 2.2* 0.9 - 1.1 Final  There may be more visits with results that are not included.      Assessment & Plan:  1. Edema Continue furosemide  and add - spironolactone (ALDACTONE) 25 MG tablet; One daily to help control edema  Dispense: 30 tablet; Refill: 2  2. Hypertension controlled  3. Kidney disease, chronic, stage III (GFR 30-59 ml/min) Recheck at future visit

## 2013-11-10 NOTE — Patient Instructions (Signed)
Prop arm higher than heart at night.

## 2013-11-12 ENCOUNTER — Encounter: Payer: Self-pay | Admitting: Internal Medicine

## 2013-11-17 ENCOUNTER — Non-Acute Institutional Stay: Payer: Medicare Other | Admitting: Internal Medicine

## 2013-11-17 ENCOUNTER — Encounter: Payer: Self-pay | Admitting: Internal Medicine

## 2013-11-17 ENCOUNTER — Telehealth: Payer: Self-pay

## 2013-11-17 VITALS — BP 110/64 | HR 64 | Temp 99.4°F | Ht 63.0 in | Wt 139.0 lb

## 2013-11-17 DIAGNOSIS — I4891 Unspecified atrial fibrillation: Secondary | ICD-10-CM | POA: Diagnosis not present

## 2013-11-17 DIAGNOSIS — R609 Edema, unspecified: Secondary | ICD-10-CM

## 2013-11-17 DIAGNOSIS — I10 Essential (primary) hypertension: Secondary | ICD-10-CM

## 2013-11-17 MED ORDER — FUROSEMIDE 80 MG PO TABS: ORAL_TABLET | ORAL | Status: DC

## 2013-11-17 NOTE — Telephone Encounter (Signed)
Called Dr. Cathren Laine office to make appointment for evaluation of edema. Appointment Friday 11/20/13 at 3:00 with Norma Fredrickson NP. Called daughter Corrie Dandy at [phone] and gave her date and time.

## 2013-11-17 NOTE — Patient Instructions (Signed)
Increase furosemide to 80 mg daily. See your cardiologist.

## 2013-11-17 NOTE — Progress Notes (Signed)
Patient ID: Nancy Payne, female   DOB: 01/02/1922, 79 y.o.   MRN: [id number]    Location:  [facility]   Place of Service: Clinic (12)    Allergies  Allergen Reactions  . Penicillins     Chief Complaint  Patient presents with  . Medical Managment of Chronic Issues    edema  . Fever    yesterday 101, cough with white phlegm for 3 days.     HPI:  Left arm puffy for about a month. Also having swelling and weeping of the legs. [facility]. Sleeps propped up. Hx AVR.  Yesterday had elevated temp to 101.2. Still with low grade fever today. Coughing. Denies sore throat. No dysuria.  Medications: Patient's Medications  New Prescriptions   No medications on file  Previous Medications   CRESTOR 10 MG TABLET    take 1 tablet by mouth once daily   FUROSEMIDE (LASIX) 40 MG TABLET    take 1 tablet by mouth if needed   GLIMEPIRIDE (AMARYL) 2 MG TABLET    take 1 tablet once daily   METOPROLOL TARTRATE (LOPRESSOR) 25 MG TABLET    take 1 tablet by mouth twice a day   SPIRONOLACTONE (ALDACTONE) 25 MG TABLET    One daily to help control edema   WARFARIN (COUMADIN) 2 MG TABLET    Take as Directed  Modified Medications   No medications on file  Discontinued Medications   METOPROLOL SUCCINATE (TOPROL-XL) 25 MG 24 HR TABLET    Take 25 mg by mouth 2 (two) times daily.   ROSUVASTATIN CALCIUM (CRESTOR PO)    Take 10 mg by mouth daily.    WARFARIN (COUMADIN) 2.5 MG TABLET    Take as directed by coumadin clinic     Review of Systems  Constitutional: Positive for fever, activity change and fatigue. Negative for chills, appetite change and unexpected weight change.  HENT: Positive for congestion. Negative for ear pain, mouth sores, postnasal drip, rhinorrhea, sinus pressure and sore throat.   Respiratory: Positive for cough and shortness of breath. Negative for chest tightness.   Cardiovascular: Positive for leg swelling. Negative for chest pain and palpitations.    Gastrointestinal: Positive for abdominal pain and abdominal distention. Negative for nausea, diarrhea, constipation and blood in stool.  Endocrine: Negative.   Genitourinary: Negative for dysuria, decreased urine volume and difficulty urinating.  Musculoskeletal: Positive for back pain and gait problem. Negative for joint swelling, myalgias, neck pain and neck stiffness.  Skin:       Weeping legs  Allergic/Immunologic: Negative.   Neurological: Negative for dizziness, tremors, seizures, syncope, facial asymmetry, speech difficulty, weakness, light-headedness and headaches.  Hematological: Negative.   Psychiatric/Behavioral: Negative for confusion, sleep disturbance, self-injury, decreased concentration and agitation. The patient is not nervous/anxious and is not hyperactive.     Filed Vitals:   11/17/13 0906  BP: 110/64  Pulse: 64  Temp: 99.4 F (37.4 C)  Height: 5\' 3"  (1.6 m)  Weight: 139 lb (63.05 kg)   Physical Exam  Constitutional: She is oriented to person, place, and time. She appears well-developed. No distress.  HENT:  Head: Normocephalic and atraumatic.  Nose: Nose normal.  Wax occlusion in both EAC.  Eyes: Conjunctivae and EOM are normal. Pupils are equal, round, and reactive to light. Left eye exhibits no discharge. No scleral icterus.  Neck: Normal range of motion. Neck supple. No JVD present. No tracheal deviation present. No thyromegaly present.  Cardiovascular: Exam  reveals no friction rub.   Murmur heard. 3/6 LSB SEM. Pacemaker in left upper chest. Bigeminal rhythm. Swollen in the left arm, both legs and abdomen.  Pulmonary/Chest: No respiratory distress. She has no wheezes. She has no rales.  Abdominal: Bowel sounds are normal. She exhibits no distension and no mass. There is no tenderness.  Musculoskeletal: Normal range of motion. She exhibits edema. She exhibits no tenderness.  Edematous legs bilaterally. Left arm has 2-3+ edema beginning above the elbow. No  adenopathy or breast lumps. Pacer is in left chest.  Lymphadenopathy:    She has no cervical adenopathy.  Neurological: She is alert and oriented to person, place, and time. She has normal reflexes. No cranial nerve deficit. Coordination normal.  Skin: Skin is dry. No rash noted. No erythema. No pallor.  Blisters on the legs are covered in compression stocking  Psychiatric: She has a normal mood and affect. Her behavior is normal. Thought content normal.     Labs reviewed: Nursing Home on 11/10/2013  Component Date Value Range Status  . Glucose 11/02/2013 220   Final  . BUN 11/02/2013 33* 4 - 21 mg/dL Final  . Creatinine 16/09/9603 1.3* 0.5 - 1.1 mg/dL Final  . Potassium 54/08/8118 3.9  3.4 - 5.3 mmol/L Final  . Sodium 11/02/2013 140  137 - 147 mmol/L Final  . Triglycerides 11/02/2013 142  40 - 160 mg/dL Final  . Cholesterol 14/78/2956 103  0 - 200 mg/dL Final  . HDL 21/30/8657 40  35 - 70 mg/dL Final  . LDL Cholesterol 11/02/2013 35   Final  . Alkaline Phosphatase 11/02/2013 85  25 - 125 U/L Final  . ALT 11/02/2013 10  7 - 35 U/L Final  . AST 11/02/2013 19  13 - 35 U/L Final  . Bilirubin, Total 11/02/2013 1.1   Final  . TSH 11/02/2013 3.23  0.41 - 5.90 uIU/mL Final  Office Visit on 11/02/2013  Component Date Value Range Status  . Glucose-Capillary 11/09/2013 166* 70 - 99 mg/dL Final  . Comment 1 84/69/6295 Notify RN   Final  Clinical Support on 10/26/2013  Component Date Value Range Status  . Date Time Interrogation Session 10/27/2013 [id number]   Final  . Pulse Generator Manufacturer 10/27/2013 Medtronic   Final  . Pulse Gen Model 10/27/2013 P1501DR EnRhythm   Final  . Pulse Gen Serial Number 10/27/2013 [id number]   Final  . RV Sense Sensitivity 10/27/2013 0.9   Final  . RV Pace PulseWidth 10/27/2013 0.4   Final  . RV Pace Amplitude 10/27/2013 2.5   Final  . Zone Setting Type Category 10/27/2013 VF   Final  . Zone Setting Type Category 10/27/2013 VT   Final  . Zone  Setting Type Category 10/27/2013 VENTRICULAR_TACHYCARDIA_1   Final  . Zone Setting Type Category 10/27/2013 VENTRICULAR_TACHYCARDIA_2   Final  . Zone Detect Interval 10/27/2013 400   Final  . Zone Setting Type Category 10/27/2013 ATRIAL_FIBRILLATION   Final  . Zone Setting Type Category 10/27/2013 ATAF   Final  . Zone Detect Interval 10/27/2013 350   Final  . RA Impedance 10/27/2013 368   Final  . RV IMPEDANCE 10/27/2013 424   Final  . RV Amplitude 10/27/2013 5.45792   Final  . Battery Status 10/27/2013 OK   Final  . Battery Voltage 10/27/2013 2.86   Final  . Huston Foley RV Perc Paced 10/27/2013 9.45   Final  . Eval Rhythm 10/27/2013 AF w/VS   Final  . Miscellaneous Comment 10/27/2013 Pacemaker  remote received. Battery voltage 2.86 V--monthly checks. Pt in AF 100% of time. + Warfarin. Normal device function. [facility] 11-30-13 and ROV in February with JA.   Final  Scanned Document on 10/23/2013  Component Date Value Range Status  . INR 10/12/2013 2.7* 0.9 - 1.1 Final  Office Visit on 10/22/2013  Component Date Value Range Status  . Glucose 10/22/2013 170* 65 - 99 mg/dL Final  . BUN 16/09/9603 24  8 - 27 mg/dL Final  . Creatinine, Ser 10/22/2013 1.24* 0.57 - 1.00 mg/dL Final  . GFR calc non Af Amer 10/22/2013 39* >59 mL/min/1.73 Final  . GFR calc Af Amer 10/22/2013 45* >59 mL/min/1.73 Final  . BUN/Creatinine Ratio 10/22/2013 19  11 - 26 Final  . Sodium 10/22/2013 144  134 - 144 mmol/L Final  . Potassium 10/22/2013 3.8  3.5 - 5.2 mmol/L Final  . Chloride 10/22/2013 99  97 - 108 mmol/L Final  . CO2 10/22/2013 29  18 - 29 mmol/L Final  . Calcium 10/22/2013 8.8  8.6 - 10.2 mg/dL Final  . Total Protein 10/22/2013 5.7* 6.0 - 8.5 g/dL Final  . Albumin 54/08/8118 3.5  3.5 - 4.7 g/dL Final  . Globulin, Total 10/22/2013 2.2  1.5 - 4.5 g/dL Final  . Albumin/Globulin Ratio 10/22/2013 1.6  1.1 - 2.5 Final  . Total Bilirubin 10/22/2013 0.7  0.0 - 1.2 mg/dL Final  . Alkaline Phosphatase 10/22/2013 87  39  - 117 IU/L Final  . AST 10/22/2013 19  0 - 40 IU/L Final  . ALT 10/22/2013 9  0 - 32 IU/L Final  . WBC 10/22/2013 6.5  3.4 - 10.8 x10E3/uL Final  . RBC 10/22/2013 4.41  3.77 - 5.28 x10E6/uL Final  . Hemoglobin 10/22/2013 12.4  11.1 - 15.9 g/dL Final  . HCT 14/78/2956 38.1  34.0 - 46.6 % Final  . MCV 10/22/2013 86  79 - 97 fL Final  . MCH 10/22/2013 28.1  26.6 - 33.0 pg Final  . MCHC 10/22/2013 32.5  31.5 - 35.7 g/dL Final  . RDW 21/30/8657 15.8* 12.3 - 15.4 % Final  . Platelets 10/22/2013 162  150 - 379 x10E3/uL Final  . Neutrophils Relative % 10/22/2013 69   Final  . Lymphs 10/22/2013 18   Final  . Monocytes 10/22/2013 9   Final  . Eos 10/22/2013 3   Final  . Basos 10/22/2013 1   Final  . Neutrophils Absolute 10/22/2013 4.6  1.4 - 7.0 x10E3/uL Final  . Lymphocytes Absolute 10/22/2013 1.2  0.7 - 3.1 x10E3/uL Final  . Monocytes Absolute 10/22/2013 0.6  0.1 - 0.9 x10E3/uL Final  . Eosinophils Absolute 10/22/2013 0.2  0.0 - 0.4 x10E3/uL Final  . Basophils Absolute 10/22/2013 0.0  0.0 - 0.2 x10E3/uL Final  . Immature Granulocytes 10/22/2013 0   Final  . Immature Grans (Abs) 10/22/2013 0.0  0.0 - 0.1 x10E3/uL Final  . Prealbumin 10/22/2013 14* 20 - 40 mg/dL Final  Clinical Support on 09/28/2013  Component Date Value Range Status  . DEVICE MODEL PM 09/28/2013 [id number] H   Final-Edited  . DEV-0014LDO 09/28/2013 Rosine Abe     Final-Edited  . DEV-0014LDO 09/28/2013 Rosine Abe     Final-Edited  . EVAL-0005E5 09/28/2013 [employer] [facility]   Final-Edited  . [id number] 09/28/2013 Pacemaker remote received for battery check. Battery voltage 2.88 V. Pt in AF 100% of time. + Warfarin. [facility] 10-26-13 and ROV in February with JA.   Final-Edited  . VENTRICULAR PACING PM 09/28/2013 11.62  Final-Edited  . BATTERY VOLTAGE 09/28/2013 2.88   Final-Edited  . AL IMPEDENCE PM 09/28/2013 392   Final-Edited  . RV LEAD IMPEDENCE PM 09/28/2013 440   Final-Edited  . RV LEAD AMPLITUDE  09/28/2013 5.8   Final-Edited  . BRDY-0001RV 09/28/2013 VVIR   Final-Edited  . BRDY-0002RV 09/28/2013 60   Final-Edited  . BRDY-0004RV 09/28/2013 130   Final-Edited  . BRDY-0005RV 09/28/2013 /   Final-Edited  . BRDY-0010RV 09/28/2013 MVP Off   Final-Edited  . BMOD-0001RV 09/28/2013 Medium Low   Final-Edited  . BMOD-0002RV 09/28/2013 7   Final-Edited  . BMOD-0003RV 09/28/2013 30 sec   Final-Edited  . BMOD-0004RV 09/28/2013 5 min   Final-Edited  Scanned Document on 09/24/2013  Component Date Value Range Status  . INR 08/31/2013 3.0* 0.9 - 1.1 Final  Scanned Document on 09/24/2013  Component Date Value Range Status  . INR 09/07/2013 4.1* 0.9 - 1.1 Final  Scanned Document on 09/07/2013  Component Date Value Range Status  . INR 08/17/2013 2.0* 0.9 - 1.1 Final  Scanned Document on 09/07/2013  Component Date Value Range Status  . INR 08/24/2013 2.2* 0.9 - 1.1 Final  There may be more visits with results that are not included.    Assessment/Plan  Edema: Odd distribution of edema. Right arm is spared. Would wonder if there is some sort of venous occlusion of the left arm. I cannot feel a clot and she is not tender. Will increase furosemide to 80 mg qd and continue spironolactone. Will ask here cardiologist to review this situation.  Hypertension: controlled  Atrial fibrillation: currently in bigeminal rhythm.

## 2013-11-18 ENCOUNTER — Telehealth: Payer: Self-pay | Admitting: *Deleted

## 2013-11-18 NOTE — Telephone Encounter (Signed)
Patient daughter in law called and wanted to know if you could write them a Rx for a Lift Chair. Stated that patient is having a hard time getting in and out of chair and making it to the bathroom. Please Advise.

## 2013-11-19 ENCOUNTER — Encounter: Payer: Self-pay | Admitting: Internal Medicine

## 2013-11-20 ENCOUNTER — Ambulatory Visit: Payer: Medicare Other | Admitting: Nurse Practitioner

## 2013-11-20 ENCOUNTER — Telehealth: Payer: Self-pay

## 2013-11-20 NOTE — Telephone Encounter (Signed)
Corrie Dandy (daughter-in-law) called patient missed her appointment with Dr. Sw[place]il[place], she wasn't able to walk out to the car.  They want to know if they need to take her to the ER. Next appt with Dr. [place]il[place] was 12/07/13. Asked if patient was worse then she was Tuesday. She's not any better. Angeline Slim I would call Dr. Chilton Si then call her back. Per Dr. Chilton Si if she's not any worse he doesn't feel she needs to go to ER and keep appt with Dr. [place]iland on 12/17/13. Called Mary back and told her. She and her husband was on her way to Mrs. Cournoyer's place and they will check on her. Make sure she is taking the increase Lasix dose.

## 2013-11-23 DIAGNOSIS — E119 Type 2 diabetes mellitus without complications: Secondary | ICD-10-CM | POA: Diagnosis not present

## 2013-11-23 DIAGNOSIS — R609 Edema, unspecified: Secondary | ICD-10-CM | POA: Diagnosis not present

## 2013-11-23 DIAGNOSIS — I10 Essential (primary) hypertension: Secondary | ICD-10-CM | POA: Diagnosis not present

## 2013-11-23 DIAGNOSIS — I4891 Unspecified atrial fibrillation: Secondary | ICD-10-CM | POA: Diagnosis not present

## 2013-11-23 DIAGNOSIS — I87319 Chronic venous hypertension (idiopathic) with ulcer of unspecified lower extremity: Secondary | ICD-10-CM | POA: Diagnosis not present

## 2013-11-23 DIAGNOSIS — Z7901 Long term (current) use of anticoagulants: Secondary | ICD-10-CM | POA: Diagnosis not present

## 2013-11-25 NOTE — Telephone Encounter (Signed)
Rx written by Dr. Chilton Si for a  Seat Liftchair  Dx: 724.2 Lumbago and 781.2 Unstable gait. Patient caregiver Notified and Rx given to Eunice Blase to take to [facility] 11/30/2013 to give to patient at her appointment.

## 2013-11-25 NOTE — Telephone Encounter (Signed)
Prescription was written 11/25/13

## 2013-11-30 ENCOUNTER — Ambulatory Visit (INDEPENDENT_AMBULATORY_CARE_PROVIDER_SITE_OTHER): Payer: Medicare Other | Admitting: *Deleted

## 2013-11-30 LAB — MDC_IDC_ENUM_SESS_TYPE_REMOTE
Battery Voltage: 2.85 V
Brady Statistic RV Percent Paced: 12.56 %
Date Time Interrogation Session: 20141229194736
Lead Channel Impedance Value: 384 Ohm
Lead Channel Impedance Value: 456 Ohm
Lead Channel Setting Pacing Amplitude: 2.5 V
Lead Channel Setting Pacing Pulse Width: 0.4 ms
Lead Channel Setting Sensing Sensitivity: 0.9 mV
MDC IDC MSMT LEADCHNL RV SENSING INTR AMPL: 6.1402
Zone Setting Detection Interval: 350 ms
Zone Setting Detection Interval: 400 ms

## 2013-12-01 ENCOUNTER — Encounter: Payer: Self-pay | Admitting: Internal Medicine

## 2013-12-01 ENCOUNTER — Non-Acute Institutional Stay: Payer: Medicare Other | Admitting: Internal Medicine

## 2013-12-01 DIAGNOSIS — R609 Edema, unspecified: Secondary | ICD-10-CM | POA: Diagnosis not present

## 2013-12-01 DIAGNOSIS — I10 Essential (primary) hypertension: Secondary | ICD-10-CM | POA: Diagnosis not present

## 2013-12-01 DIAGNOSIS — I4891 Unspecified atrial fibrillation: Secondary | ICD-10-CM

## 2013-12-01 NOTE — Progress Notes (Signed)
Patient ID: Nancy Payne, female   DOB: 20-Jun-1922, 79 y.o.   MRN: [id number]    Location:  [facility]   Place of Service: Clinic (12)    Allergies  Allergen Reactions  . Penicillins     Chief Complaint  Patient presents with  . Edema    in arms follow up, better per patient.  with son Nancy Payne. Has appointment 12/07/13 with Nancy Payne, cardiology office.    HPI:  She is significantly better as compared to 2 weeks ago. She is over her viral illness. The edema has improved. No swelling in the left arm. Legs are wrapped in compression wraps and are less swollen. She is not as swollen in the abdomen. She has lost 20# since last seen. Denies dyspnea.  Medications: Patient's Medications  New Prescriptions   No medications on file  Previous Medications   CRESTOR 10 MG TABLET    take 1 tablet by mouth once daily   FUROSEMIDE (LASIX) 80 MG TABLET    One daily to help control edema   GLIMEPIRIDE (AMARYL) 2 MG TABLET    take 1 tablet once daily   METOPROLOL TARTRATE (LOPRESSOR) 25 MG TABLET    take 1 tablet by mouth twice a day   SPIRONOLACTONE (ALDACTONE) 25 MG TABLET    One daily to help control edema   WARFARIN (COUMADIN) 2 MG TABLET    Take as Directed  Modified Medications   No medications on file  Discontinued Medications   No medications on file     Review of Systems  Constitutional: Positive for activity change and fatigue. Negative for fever, chills, appetite change and unexpected weight change.  HENT: Negative for congestion, ear pain, mouth sores, postnasal drip, rhinorrhea, sinus pressure and sore throat.   Respiratory: Negative for cough, chest tightness and shortness of breath.   Cardiovascular: Positive for leg swelling. Negative for chest pain and palpitations.  Gastrointestinal: Positive for abdominal distention. Negative for nausea, abdominal pain, diarrhea, constipation and blood in stool.  Endocrine: Negative.   Genitourinary: Negative for dysuria,  decreased urine volume and difficulty urinating.  Musculoskeletal: Positive for back pain and gait problem. Negative for joint swelling, myalgias, neck pain and neck stiffness.  Skin:       Weeping legs  Allergic/Immunologic: Negative.   Neurological: Negative for dizziness, tremors, seizures, syncope, facial asymmetry, speech difficulty, weakness, light-headedness and headaches.  Hematological: Negative.   Psychiatric/Behavioral: Negative for confusion, sleep disturbance, self-injury, decreased concentration and agitation. The patient is not nervous/anxious and is not hyperactive.     There were no vitals filed for this visit. Physical Exam  Constitutional: She is oriented to person, place, and time. She appears well-developed. No distress.  HENT:  Head: Normocephalic and atraumatic.  Nose: Nose normal.  Eyes: Conjunctivae and EOM are normal. Pupils are equal, round, and reactive to light. Left eye exhibits no discharge. No scleral icterus.  Neck: Normal range of motion. Neck supple. No JVD present. No tracheal deviation present. No thyromegaly present.  Cardiovascular: Exam reveals no friction rub.   Murmur heard. 3/6 LSB SEM. Pacemaker in left upper chest. Bigeminal rhythm.   Pulmonary/Chest: No respiratory distress. She has no wheezes. She has no rales.  Abdominal: Bowel sounds are normal. She exhibits no distension and no mass. There is no tenderness.  Musculoskeletal: Normal range of motion. She exhibits edema. She exhibits no tenderness.  Edematous legs bilaterally. Left arm has 2-3+ edema beginning above the elbow. No adenopathy or breast  lumps. Pacer is in left chest.  Lymphadenopathy:    She has no cervical adenopathy.  Neurological: She is alert and oriented to person, place, and time. She has normal reflexes. No cranial nerve deficit. Coordination normal.  Skin: Skin is dry. No rash noted. No erythema. No pallor.  Blisters on the legs are covered in compression stocking    Psychiatric: She has a normal mood and affect. Her behavior is normal. Thought content normal.     Labs reviewed: Nursing Home on 11/10/2013  Component Date Value Range Status  . Glucose 11/02/2013 220   Final  . BUN 11/02/2013 33* 4 - 21 mg/dL Final  . Creatinine 16/09/9603 1.3* 0.5 - 1.1 mg/dL Final  . Potassium 54/08/8118 3.9  3.4 - 5.3 mmol/L Final  . Sodium 11/02/2013 140  137 - 147 mmol/L Final  . Triglycerides 11/02/2013 142  40 - 160 mg/dL Final  . Cholesterol 14/78/2956 103  0 - 200 mg/dL Final  . HDL 21/30/8657 40  35 - 70 mg/dL Final  . LDL Cholesterol 11/02/2013 35   Final  . Alkaline Phosphatase 11/02/2013 85  25 - 125 U/L Final  . ALT 11/02/2013 10  7 - 35 U/L Final  . AST 11/02/2013 19  13 - 35 U/L Final  . Bilirubin, Total 11/02/2013 1.1   Final  . TSH 11/02/2013 3.23  0.41 - 5.90 uIU/mL Final  Office Visit on 11/02/2013  Component Date Value Range Status  . Glucose-Capillary 11/09/2013 166* 70 - 99 mg/dL Final  . Comment 1 84/69/6295 Notify RN   Final  Clinical Support on 10/26/2013  Component Date Value Range Status  . Date Time Interrogation Session 10/27/2013 [id number]   Final  . Pulse Generator Manufacturer 10/27/2013 Medtronic   Final  . Pulse Gen Model 10/27/2013 [id number] EnRhythm   Final  . Pulse Gen Serial Number 10/27/2013 [id number]   Final  . RV Sense Sensitivity 10/27/2013 0.9   Final  . RV Pace PulseWidth 10/27/2013 0.4   Final  . RV Pace Amplitude 10/27/2013 2.5   Final  . Zone Setting Type Category 10/27/2013 VF   Final  . Zone Setting Type Category 10/27/2013 VT   Final  . Zone Setting Type Category 10/27/2013 VENTRICULAR_TACHYCARDIA_1   Final  . Zone Setting Type Category 10/27/2013 VENTRICULAR_TACHYCARDIA_2   Final  . Zone Detect Interval 10/27/2013 400   Final  . Zone Setting Type Category 10/27/2013 ATRIAL_FIBRILLATION   Final  . Zone Setting Type Category 10/27/2013 ATAF   Final  . Zone Detect Interval 10/27/2013 350   Final  . RA  Impedance 10/27/2013 368   Final  . RV IMPEDANCE 10/27/2013 424   Final  . RV Amplitude 10/27/2013 5.45792   Final  . Battery Status 10/27/2013 OK   Final  . Battery Voltage 10/27/2013 2.86   Final  . Huston Foley RV Perc Paced 10/27/2013 9.45   Final  . Eval Rhythm 10/27/2013 AF w/VS   Final  . Miscellaneous Comment 10/27/2013 Pacemaker remote received. Battery voltage 2.86 V--monthly checks. Pt in AF 100% of time. + Warfarin. Normal device function. [facility] 11-30-13 and ROV in February with JA.   Final  Scanned Document on 10/23/2013  Component Date Value Range Status  . INR 10/12/2013 2.7* 0.9 - 1.1 Final  Office Visit on 10/22/2013  Component Date Value Range Status  . Glucose 10/22/2013 170* 65 - 99 mg/dL Final  . BUN 74/25/9563 24  8 - 27 mg/dL Final  . Creatinine, Ser 10/22/2013  1.24* 0.57 - 1.00 mg/dL Final  . GFR calc non Af Amer 10/22/2013 39* >59 mL/min/1.73 Final  . GFR calc Af Amer 10/22/2013 45* >59 mL/min/1.73 Final  . BUN/Creatinine Ratio 10/22/2013 19  11 - 26 Final  . Sodium 10/22/2013 144  134 - 144 mmol/L Final  . Potassium 10/22/2013 3.8  3.5 - 5.2 mmol/L Final  . Chloride 10/22/2013 99  97 - 108 mmol/L Final  . CO2 10/22/2013 29  18 - 29 mmol/L Final  . Calcium 10/22/2013 8.8  8.6 - 10.2 mg/dL Final  . Total Protein 10/22/2013 5.7* 6.0 - 8.5 g/dL Final  . Albumin 40/98/1191 3.5  3.5 - 4.7 g/dL Final  . Globulin, Total 10/22/2013 2.2  1.5 - 4.5 g/dL Final  . Albumin/Globulin Ratio 10/22/2013 1.6  1.1 - 2.5 Final  . Total Bilirubin 10/22/2013 0.7  0.0 - 1.2 mg/dL Final  . Alkaline Phosphatase 10/22/2013 87  39 - 117 IU/L Final  . AST 10/22/2013 19  0 - 40 IU/L Final  . ALT 10/22/2013 9  0 - 32 IU/L Final  . WBC 10/22/2013 6.5  3.4 - 10.8 x10E3/uL Final  . RBC 10/22/2013 4.41  3.77 - 5.28 x10E6/uL Final  . Hemoglobin 10/22/2013 12.4  11.1 - 15.9 g/dL Final  . HCT 47/82/9562 38.1  34.0 - 46.6 % Final  . MCV 10/22/2013 86  79 - 97 fL Final  . MCH 10/22/2013 28.1  26.6  - 33.0 pg Final  . MCHC 10/22/2013 32.5  31.5 - 35.7 g/dL Final  . RDW 13/07/6577 15.8* 12.3 - 15.4 % Final  . Platelets 10/22/2013 162  150 - 379 x10E3/uL Final  . Neutrophils Relative % 10/22/2013 69   Final  . Lymphs 10/22/2013 18   Final  . Monocytes 10/22/2013 9   Final  . Eos 10/22/2013 3   Final  . Basos 10/22/2013 1   Final  . Neutrophils Absolute 10/22/2013 4.6  1.4 - 7.0 x10E3/uL Final  . Lymphocytes Absolute 10/22/2013 1.2  0.7 - 3.1 x10E3/uL Final  . Monocytes Absolute 10/22/2013 0.6  0.1 - 0.9 x10E3/uL Final  . Eosinophils Absolute 10/22/2013 0.2  0.0 - 0.4 x10E3/uL Final  . Basophils Absolute 10/22/2013 0.0  0.0 - 0.2 x10E3/uL Final  . Immature Granulocytes 10/22/2013 0   Final  . Immature Grans (Abs) 10/22/2013 0.0  0.0 - 0.1 x10E3/uL Final  . Prealbumin 10/22/2013 14* 20 - 40 mg/dL Final  Clinical Support on 09/28/2013  Component Date Value Range Status  . DEVICE MODEL PM 09/28/2013 [id number]   Final-Edited  . DEV-0014LDO 09/28/2013 Rosine Abe     Final-Edited  . DEV-0014LDO 09/28/2013 Rosine Abe     Final-Edited  . EVAL-0005E5 09/28/2013 Med[employer] [facility]   Final-Edited  . UXLK-[id number] 09/28/2013 Pacemaker remote received for battery check. Battery voltage 2.88 V. Pt in AF 100% of time. + Warfarin. [facility] 10-26-13 and ROV in February with JA.   Final-Edited  . VENTRICULAR PACING PM 09/28/2013 11.62   Final-Edited  . BATTERY VOLTAGE 09/28/2013 2.88   Final-Edited  . AL IMPEDENCE PM 09/28/2013 392   Final-Edited  . RV LEAD IMPEDENCE PM 09/28/2013 440   Final-Edited  . RV LEAD AMPLITUDE 09/28/2013 5.8   Final-Edited  . BRDY-0001RV 09/28/2013 VVIR   Final-Edited  . BRDY-0002RV 09/28/2013 60   Final-Edited  . BRDY-0004RV 09/28/2013 130   Final-Edited  . BRDY-0005RV 09/28/2013 /   Final-Edited  . BRDY-0010RV 09/28/2013 MVP Off   Final-Edited  .  BMOD-0001RV 09/28/2013 Medium Low   Final-Edited  . BMOD-0002RV 09/28/2013 7   Final-Edited  . BMOD-0003RV  09/28/2013 30 sec   Final-Edited  . BMOD-0004RV 09/28/2013 5 min   Final-Edited  Scanned Document on 09/24/2013  Component Date Value Range Status  . INR 08/31/2013 3.0* 0.9 - 1.1 Final  Scanned Document on 09/24/2013  Component Date Value Range Status  . INR 09/07/2013 4.1* 0.9 - 1.1 Final  Scanned Document on 09/07/2013  Component Date Value Range Status  . INR 08/17/2013 2.0* 0.9 - 1.1 Final  Scanned Document on 09/07/2013  Component Date Value Range Status  . INR 08/24/2013 2.2* 0.9 - 1.1 Final  There may be more visits with results that are not included.    Assessment/Plan  Edema:improved  Atrial fibrillation: stable and rate controlled  Hypertension: controlled  Kidney disease, chronic, stage III (GFR 30-59 ml/min):repeat BMMP

## 2013-12-01 NOTE — Patient Instructions (Signed)
Reduce furosemide to 40 mg qd. Continue spironolactone.

## 2013-12-07 ENCOUNTER — Ambulatory Visit (INDEPENDENT_AMBULATORY_CARE_PROVIDER_SITE_OTHER): Payer: Medicare Other | Admitting: Nurse Practitioner

## 2013-12-07 ENCOUNTER — Other Ambulatory Visit: Payer: Self-pay | Admitting: Internal Medicine

## 2013-12-07 ENCOUNTER — Encounter: Payer: Self-pay | Admitting: Nurse Practitioner

## 2013-12-07 VITALS — BP 120/70 | HR 83 | Ht 63.0 in | Wt 117.4 lb

## 2013-12-07 DIAGNOSIS — R609 Edema, unspecified: Secondary | ICD-10-CM

## 2013-12-07 DIAGNOSIS — I4891 Unspecified atrial fibrillation: Secondary | ICD-10-CM | POA: Diagnosis not present

## 2013-12-07 DIAGNOSIS — I5022 Chronic systolic (congestive) heart failure: Secondary | ICD-10-CM | POA: Diagnosis not present

## 2013-12-07 LAB — BASIC METABOLIC PANEL
BUN: 35 mg/dL — ABNORMAL HIGH (ref 6–23)
CO2: 31 mEq/L (ref 19–32)
Calcium: 9.2 mg/dL (ref 8.4–10.5)
Chloride: 100 mEq/L (ref 96–112)
Creatinine, Ser: 1.2 mg/dL (ref 0.4–1.2)
GFR: 44.94 mL/min — ABNORMAL LOW (ref >60.00)
Glucose, Bld: 86 mg/dL (ref 70–99)
Potassium: 4.4 mEq/L (ref 3.5–5.1)
Sodium: 139 mEq/L (ref 135–145)

## 2013-12-07 MED ORDER — GLIMEPIRIDE 2 MG PO TABS
2.0000 mg | ORAL_TABLET | Freq: Every day | ORAL | Status: DC
Start: 2013-12-07 — End: 2014-03-15

## 2013-12-07 NOTE — Progress Notes (Signed)
Nancy Payne Date of Birth: [birth year] Medical Record #[id number]  History of Present Illness: Nancy Payne is seen back today for a work in visit. Seen for Nancy Payne. She has multiple issues which include underlying pacemaker for SSS, persistent atrial fib, AS with past AVR, HTN, edema, type 2 DM, HLD, spinal stenosis. She underwent AVR with resection and grafting of the proximal descending thoracic aorta and repair of a left ventricular perforation in September of 2005. She has had past right leg fracture.   I saw her last April - she was in declining health. Not bathing or caring for herself. Had non healing leg ulcers. We updated her echo and checked dopplers on her legs. Ended up referring her on to the wound clinic. Sees Nancy Payne for primary care.   Comes in today - here with her daughter. Here for swelling - started several weeks ago - mostly in her left arm and in both legs/feet - given Lasix 80 mg and aldactone - now improved and her diuretics have been cut back. Her weight has gone way down. She was short of breath when she had all the swelling - this has now resolved as well. Salt use does not seem to be an issue but she is at [facility] and they do have salt in their foods. No chest pain. She now feels good. Her left arm has had no redness or warmth. Coumadin level has been ok.    Current Outpatient Prescriptions  Medication Sig Dispense Refill  . CRESTOR 10 MG tablet take 1 tablet by mouth once daily  30 tablet  3  . furosemide (LASIX) 40 MG tablet Take 40 mg by mouth daily.      Marland Kitchen glimepiride (AMARYL) 2 MG tablet Take 1 tablet (2 mg total) by mouth daily with breakfast.  30 tablet  2  . metoprolol tartrate (LOPRESSOR) 25 MG tablet take 1 tablet by mouth twice a day  60 tablet  3  . spironolactone (ALDACTONE) 25 MG tablet One daily to help control edema  30 tablet  2  . warfarin (COUMADIN) 2 MG tablet Take as Directed  60 tablet  3   No current facility-administered  medications for this visit.    Allergies  Allergen Reactions  . Penicillins     Past Medical History  Diagnosis Date  . Sick sinus syndrome     post DDD pacemaker  . Hypertension   . Edema   . Type II or unspecified type diabetes mellitus without mention of complication, not stated as uncontrolled     type 2  . Hyperlipidemia   . Atrial fibrillation     permanent afib/aflutter  . Macular degeneration 2012    left   . Skin cancer of nose 2012    with skin graft  . Kidney disease, chronic, stage III (GFR 30-59 ml/min)   . Eczema   . Xerostomia 2012  . Xerophthalmia 2012  . Brain atrophy 2009    Past Surgical History  Procedure Laterality Date  . Pacemaker insertion  2005  . Cardiac valve surgery  2005    Dr. Ricard Payne  . Ankle surgery Right 1990    fracture as steel plate   . Knee surgery Right 1990    fracture has plate and rod in femur  . Colonoscopy  2008    History  Smoking status  . Never Smoker   Smokeless tobacco  . Never Used    History  Alcohol Use No  Family History  Problem Relation Age of Onset  . Stroke Mother   . Cancer Brother   . Nancy Payne Sister     Review of Systems: The review of systems is per the HPI.  All other systems were reviewed and are negative.  Physical Exam: BP 120/70  Pulse 83  Ht 5\' 3"  (1.6 m)  Wt 117 lb 6.4 oz (53.252 kg)  BMI 20.80 kg/m2  SpO2 96% Weight down substantially. Noted that she weighed 140 on her last visit with me from April of 2014 but had swelling noted on exam.  Patient is very pleasant and in no acute distress. Skin is warm and dry. Color is normal.  HEENT is unremarkable. Normocephalic/atraumatic. PERRL. Sclera are nonicteric. Neck is supple. No masses. No JVD. Lungs are clear. Cardiac exam shows an irregular rhythm. Pacemaker in the left upper chest.  Abdomen is soft. Extremities are without edema. Gait and ROM are intact. No gross neurologic deficits noted.  Wt Readings from Last 3 Encounters:    12/07/13 117 lb 6.4 oz (53.252 kg)  11/17/13 139 lb (63.05 kg)  11/10/13 141 lb (63.957 kg)     LABORATORY DATA: BMET pending  Lab Results  Component Value Date   WBC 6.5 10/22/2013   HGB 12.4 10/22/2013   HCT 38.1 10/22/2013   PLT 162 10/22/2013   GLUCOSE 170* 10/22/2013   CHOL 103 11/02/2013   TRIG 142 11/02/2013   HDL 40 11/02/2013   LDLCALC 35 11/02/2013   ALT 10 11/02/2013   AST 19 11/02/2013   NA 140 11/02/2013   K 3.9 11/02/2013   CL 99 10/22/2013   CREATININE 1.3* 11/02/2013   BUN 33* 11/02/2013   CO2 29 10/22/2013   TSH 3.23 11/02/2013   INR 2.7* 10/12/2013     Lab Results  Component Value Date   INR 2.7* 10/12/2013   INR 4.1* 09/07/2013   INR 3.0* 08/31/2013      Echo looks good. Mild to moderate LV dysfunction EF 40-45%. The prosthetic AV is functioning well. Need to focus on getting her legs wounds healed.   Nancy Martinique MD, [facility]   Study Conclusions  - Left ventricle: The cavity size was normal. Wall thickness was increased in a pattern of mild LVH. Systolic function was mildly to moderately reduced. The estimated ejection fraction was in the range of 40% to 45%. There is dyskinesis of the septal myocardium. The study is not technically sufficient to allow evaluation of LV diastolic function.   - Aortic valve: A bioprosthesis was present. Trivial regurgitation. - Mitral valve: Calcified annulus. Mildly thickened leaflets . Moderate regurgitation. - Left atrium: The atrium was severely dilated. - Right atrium: The atrium was severely dilated. - Tricuspid valve: Wide-open regurgitation. - Pulmonary arteries: Systolic pressure was mildly increased. PA peak pressure: 87mm Hg (S). - Pericardium, extracardiac: A trivial pericardial effusion was identified. There was a left pleural effusion.   Assessment / Plan: 1. Advanced age   16. Chronic leg ulcers - has just been released from the wound center.   3. Systolic HF - EF of 40 to 45% - sounds like  she has had some exacerbation of her heart failure - now improved and feeling well. Looks compensated. Would leave her on her current regimen. Recheck BMET today. Would like to see her back in about 4 months. If she continues to have localized swelling of the left arm - then may need to consider doppler study in the future.   4.  Chronic atrial fib - managed with rate control and on anticoagulation. Her INRs have been therapeutic.   5. Past AVR with resection and grafting of the proximal descending thoracic aorta and repair of a left ventricular perforation in September of 2005  Patient is agreeable to this plan and will call if any problems develop in the interim.   Burtis Junes, RN, [facility] [address] [place], [place]  [address]

## 2013-12-07 NOTE — Patient Instructions (Addendum)
Stay on your current medicines  We will check labs today  See me in 4 months  Call the [place] office at [phone] if you have any questions, problems or concerns.

## 2013-12-07 NOTE — Telephone Encounter (Signed)
rx filled per protocol  

## 2013-12-09 ENCOUNTER — Encounter: Payer: Self-pay | Admitting: Internal Medicine

## 2013-12-14 ENCOUNTER — Telehealth: Payer: Self-pay | Admitting: *Deleted

## 2013-12-14 NOTE — Telephone Encounter (Signed)
Patient caregiver called and stated that patient is having swelling in her feet. Said she has gained 4 pounds since last week. Offered an appointment for Wednesday or Thursday, but caregiver said she would have to call back to schedule an appointment after talking with the family.

## 2013-12-15 ENCOUNTER — Encounter: Payer: Self-pay | Admitting: *Deleted

## 2013-12-17 ENCOUNTER — Encounter: Payer: Self-pay | Admitting: Internal Medicine

## 2013-12-18 ENCOUNTER — Other Ambulatory Visit: Payer: Self-pay | Admitting: Internal Medicine

## 2013-12-23 ENCOUNTER — Other Ambulatory Visit: Payer: Self-pay | Admitting: *Deleted

## 2013-12-23 MED ORDER — ROSUVASTATIN CALCIUM 10 MG PO TABS: ORAL_TABLET | ORAL | Status: DC

## 2013-12-24 DIAGNOSIS — I4891 Unspecified atrial fibrillation: Secondary | ICD-10-CM | POA: Diagnosis not present

## 2013-12-24 DIAGNOSIS — Z7901 Long term (current) use of anticoagulants: Secondary | ICD-10-CM | POA: Diagnosis not present

## 2013-12-24 LAB — PROTIME-INR

## 2013-12-25 ENCOUNTER — Telehealth: Payer: Self-pay | Admitting: *Deleted

## 2013-12-25 ENCOUNTER — Encounter: Payer: Self-pay | Admitting: *Deleted

## 2013-12-25 MED ORDER — WARFARIN SODIUM 1 MG PO TABS: 1.0000 mg | ORAL_TABLET | Freq: Every day | ORAL | Status: DC

## 2013-12-25 NOTE — Telephone Encounter (Signed)
Spoke to [employer]  Pt needing a RX for coumadin 2 mg sent to the pharmacy so that pt can take 3 mg daily and recheck in 1 week.  Rx sent to the pharmacy.

## 2013-12-25 NOTE — Telephone Encounter (Signed)
Created in error

## 2013-12-25 NOTE — Addendum Note (Signed)
Addended by: Armandina Gemma L on: 12/25/2013 11:15 AM   Modules accepted: Orders

## 2013-12-25 NOTE — Telephone Encounter (Signed)
Protime Results 12/24/2013     PT:  15.3    INR:   1.23 Current Dose of Coumadin 2mg  everyday per patient Per Dr. Mariea Clonts:  Cardiology NP notes indicate poor self care not bathing and grooming herself. May need to move to [place]. Next Appointment with Dr. Nyoka Cowden is 01/12/2014 in [place]. Increase Coumadin to 3mg  daily and recheck in 1 week. I Notified Therese at [facility] and she will go and speak with patient regarding instructions. Faxed to [facility].

## 2013-12-31 ENCOUNTER — Telehealth: Payer: Self-pay | Admitting: *Deleted

## 2013-12-31 DIAGNOSIS — I4891 Unspecified atrial fibrillation: Secondary | ICD-10-CM | POA: Diagnosis not present

## 2013-12-31 DIAGNOSIS — Z7901 Long term (current) use of anticoagulants: Secondary | ICD-10-CM | POA: Diagnosis not present

## 2013-12-31 LAB — PROTIME-INR: INR: 1.3 — AB (ref 0.9–1.1)

## 2013-12-31 NOTE — Telephone Encounter (Signed)
Protime Results from [facility] 12/31/2013 INR:  1.34 PT:   16.4  Current Dose of Coumadin 2mg  daily Per Jessica---increase 3mg  Coumadin recheck Monday 12/31/2013 Dana,daughter and Milus Banister with [facility] Notified and faxed to facility

## 2014-01-01 ENCOUNTER — Ambulatory Visit (INDEPENDENT_AMBULATORY_CARE_PROVIDER_SITE_OTHER): Payer: Medicare Other | Admitting: *Deleted

## 2014-01-04 DIAGNOSIS — Z7901 Long term (current) use of anticoagulants: Secondary | ICD-10-CM | POA: Diagnosis not present

## 2014-01-04 DIAGNOSIS — I4891 Unspecified atrial fibrillation: Secondary | ICD-10-CM | POA: Diagnosis not present

## 2014-01-04 LAB — PROTIME-INR: INR: 1.3 — AB (ref 0.9–1.1)

## 2014-01-04 NOTE — Addendum Note (Signed)
Addended by: Sharlot Gowda on: 01/04/2014 03:52 PM   Modules accepted: Level of Service

## 2014-01-05 ENCOUNTER — Telehealth: Payer: Self-pay | Admitting: *Deleted

## 2014-01-05 NOTE — Telephone Encounter (Signed)
Protime Results from [facility] on 01/04/2014  INR:  1.31 PT:   16.1  Current Dose of Coumadin:  3mg  daily  Per Dr. Sharol Harness Coumadin to 4mg  daily and recheck on Thursday 01/07/2014  Patient daughter Notified and faxed to [facility]

## 2014-01-06 ENCOUNTER — Encounter: Payer: Self-pay | Admitting: Internal Medicine

## 2014-01-07 ENCOUNTER — Telehealth: Payer: Self-pay | Admitting: *Deleted

## 2014-01-07 DIAGNOSIS — I4891 Unspecified atrial fibrillation: Secondary | ICD-10-CM | POA: Diagnosis not present

## 2014-01-07 DIAGNOSIS — Z7901 Long term (current) use of anticoagulants: Secondary | ICD-10-CM | POA: Diagnosis not present

## 2014-01-07 LAB — PROTIME-INR

## 2014-01-07 NOTE — Telephone Encounter (Signed)
Protime Results from [facility] 01/07/2014  INR:   1.43 PT:    17.2  Current Dose of Coumadin:  4mg  everyday  Per Dr. Mariea Clonts:  Continue Coumadin 4mg  and recheck in 1 week.  01/07/2014: Left Message on Dana's phone of Coumadin Dosage and also called and informed patient. Faxed results back to Watson at [facility]

## 2014-01-10 LAB — MDC_IDC_ENUM_SESS_TYPE_REMOTE
Battery Voltage: 2.85 V
Brady Statistic RV Percent Paced: 5.93 %
Lead Channel Impedance Value: 456 Ohm
Lead Channel Sensing Intrinsic Amplitude: 5.799
Lead Channel Setting Pacing Amplitude: 2.5 V
Lead Channel Setting Pacing Pulse Width: 0.4 ms
MDC IDC MSMT LEADCHNL RA IMPEDANCE VALUE: 384 Ohm
MDC IDC MSMT LEADCHNL RA SENSING INTR AMPL: 2.6294
MDC IDC SESS DTM: 20150130225406
MDC IDC SET LEADCHNL RV SENSING SENSITIVITY: 0.9 mV
MDC IDC SET ZONE DETECTION INTERVAL: 350 ms
MDC IDC SET ZONE DETECTION INTERVAL: 400 ms

## 2014-01-12 ENCOUNTER — Encounter: Payer: Self-pay | Admitting: Internal Medicine

## 2014-01-12 ENCOUNTER — Non-Acute Institutional Stay: Payer: Medicare Other | Admitting: Internal Medicine

## 2014-01-12 VITALS — BP 110/70 | HR 68 | Ht 63.0 in | Wt 124.0 lb

## 2014-01-12 DIAGNOSIS — R609 Edema, unspecified: Secondary | ICD-10-CM | POA: Diagnosis not present

## 2014-01-12 DIAGNOSIS — I10 Essential (primary) hypertension: Secondary | ICD-10-CM | POA: Diagnosis not present

## 2014-01-12 DIAGNOSIS — I509 Heart failure, unspecified: Secondary | ICD-10-CM | POA: Insufficient documentation

## 2014-01-12 DIAGNOSIS — E1129 Type 2 diabetes mellitus with other diabetic kidney complication: Secondary | ICD-10-CM | POA: Diagnosis not present

## 2014-01-12 DIAGNOSIS — E1122 Type 2 diabetes mellitus with diabetic chronic kidney disease: Secondary | ICD-10-CM | POA: Insufficient documentation

## 2014-01-12 DIAGNOSIS — N183 Chronic kidney disease, stage 3 (moderate): Secondary | ICD-10-CM

## 2014-01-12 DIAGNOSIS — I4891 Unspecified atrial fibrillation: Secondary | ICD-10-CM | POA: Diagnosis not present

## 2014-01-12 DIAGNOSIS — E785 Hyperlipidemia, unspecified: Secondary | ICD-10-CM

## 2014-01-12 DIAGNOSIS — E1165 Type 2 diabetes mellitus with hyperglycemia: Secondary | ICD-10-CM

## 2014-01-12 DIAGNOSIS — IMO0002 Reserved for concepts with insufficient information to code with codable children: Secondary | ICD-10-CM | POA: Insufficient documentation

## 2014-01-12 NOTE — Progress Notes (Signed)
Patient ID: Nancy Payne, female   DOB: [birth year], 80 y.o.   MRN: [id number]    Location:  [facility]   Place of Service: Clinic (12)    Allergies  Allergen Reactions  . Penicillins     Chief Complaint  Patient presents with  . Medical Managment of Chronic Issues    edema, A-Fib, CKD, blood pressure    HPI:  Hypertension: controlled  Atrial fibrillation: rate controlled  Edema: improved. Has lost from 139# in Dec 2014 to current weight of 124#.  Type II or unspecified type diabetes mellitus with renal manifestations, not stated as uncontrolled: not checked with lab recently. Continues on oral meds for control.  Hyperlipidemia: needs recheck  Congestive heart failure (CHF): improved    Medications: Patient's Medications  New Prescriptions   No medications on file  Previous Medications   FUROSEMIDE (LASIX) 40 MG TABLET    Take 40 mg by mouth daily.   GLIMEPIRIDE (AMARYL) 2 MG TABLET    Take 1 tablet (2 mg total) by mouth daily with breakfast.   METOPROLOL TARTRATE (LOPRESSOR) 25 MG TABLET    take 1 tablet by mouth twice a day   ROSUVASTATIN (CRESTOR) 10 MG TABLET    Take one tablet by mouth once daily to control cholesterol   SPIRONOLACTONE (ALDACTONE) 25 MG TABLET    One daily to help control edema   WARFARIN (COUMADIN) 1 MG TABLET    Take 1 tablet (1 mg total) by mouth daily. Take 1 tablet daily.  Modified Medications   Modified Medication Previous Medication   WARFARIN (COUMADIN) 2 MG TABLET warfarin (COUMADIN) 2 MG tablet      Take as Directed take 2 tablets once daily    Take as Directed  Discontinued Medications   No medications on file     Review of Systems  Constitutional: Positive for activity change and fatigue. Negative for fever, chills, appetite change and unexpected weight change.  HENT: Negative for congestion, ear pain, mouth sores, postnasal drip, rhinorrhea, sinus pressure and sore throat.   Respiratory: Negative for cough, chest  tightness and shortness of breath.   Cardiovascular: Negative for chest pain, palpitations and leg swelling.  Gastrointestinal: Negative for nausea, abdominal pain, diarrhea, constipation, blood in stool and abdominal distention.  Endocrine: Negative.   Genitourinary: Negative for dysuria, decreased urine volume and difficulty urinating.  Musculoskeletal: Positive for back pain and gait problem. Negative for joint swelling, myalgias, neck pain and neck stiffness.  Skin:       Weeping legs  Allergic/Immunologic: Negative.   Neurological: Negative for dizziness, tremors, seizures, syncope, facial asymmetry, speech difficulty, weakness, light-headedness and headaches.  Hematological: Negative.   Psychiatric/Behavioral: Negative for confusion, sleep disturbance, self-injury, decreased concentration and agitation. The patient is not nervous/anxious and is not hyperactive.     Filed Vitals:   01/12/14 0922  BP: 110/70  Pulse: 68  Height: 5\' 3"  (1.6 m)  Weight: 124 lb (56.246 kg)   Physical Exam  Constitutional: She is oriented to person, place, and time. She appears well-developed. No distress.  HENT:  Head: Normocephalic and atraumatic.  Nose: Nose normal.  Eyes: Conjunctivae and EOM are normal. Pupils are equal, round, and reactive to light. Left eye exhibits no discharge. No scleral icterus.  Neck: Normal range of motion. Neck supple. No JVD present. No tracheal deviation present. No thyromegaly present.  Cardiovascular: Exam reveals no friction rub.   Murmur heard. 3/6 LSB SEM. Pacemaker in left upper chest. Bigeminal  rhythm.   Pulmonary/Chest: No respiratory distress. She has no wheezes. She has no rales.  Abdominal: Bowel sounds are normal. She exhibits no distension and no mass. There is no tenderness.  Musculoskeletal: Normal range of motion. She exhibits no edema and no tenderness.  Edematous legs bilaterally. Left arm has 2-3+ edema beginning above the elbow. No adenopathy or  breast lumps. Pacer is in left chest.  Lymphadenopathy:    She has no cervical adenopathy.  Neurological: She is alert and oriented to person, place, and time. She has normal reflexes. No cranial nerve deficit. Coordination normal.  Skin: Skin is dry. No rash noted. No erythema. No pallor.  Blisters on the legs are covered in compression stocking  Psychiatric: She has a normal mood and affect. Her behavior is normal. Thought content normal.     Labs reviewed: Scanned Document on 01/06/2014  Component Date Value Range Status  . INR 12/31/2013 1.3* 0.9 - 1.1 Final  Clinical Support on 01/01/2014  Component Date Value Range Status  . Date Time Interrogation Session 01/10/2014 [id number]   Final  . Pulse Generator Manufacturer 01/10/2014 Medtronic   Final  . Pulse Gen Model 01/10/2014 P1501DR EnRhythm   Final  . Pulse Gen Serial Number 01/10/2014 [id number]   Final  . RV Sense Sensitivity 01/10/2014 0.9   Final  . RV Pace PulseWidth 01/10/2014 0.4   Final  . RV Pace Amplitude 01/10/2014 2.5   Final  . Zone Setting Type Category 01/10/2014 VF   Final  . Zone Setting Type Category 01/10/2014 VT   Final  . Zone Setting Type Category 01/10/2014 VENTRICULAR_TACHYCARDIA_1   Final  . Zone Setting Type Category 01/10/2014 VENTRICULAR_TACHYCARDIA_2   Final  . Zone Detect Interval 01/10/2014 400   Final  . Zone Setting Type Category 01/10/2014 ATRIAL_FIBRILLATION   Final  . Zone Setting Type Category 01/10/2014 ATAF   Final  . Zone Detect Interval 01/10/2014 350   Final  . RA Impedance 01/10/2014 384   Final  . RA Amplitude 01/10/2014 2.62944   Final  . RV IMPEDANCE 01/10/2014 456   Final  . RV Amplitude 01/10/2014 5.79904   Final  . Battery Status 01/10/2014 OK   Final  . Battery Voltage 01/10/2014 2.85   Final  . Loletha Grayer RV Perc Paced 01/10/2014 5.93   Final  . Eval Rhythm 01/10/2014 AF w/Vs   Final  . Miscellaneous Comment 01/10/2014 Pacemaker remote received for battery check. Battery  voltage 2.85 V. Pt in AF 17.0% of time. + Warfarin. ROV in February with JA.   Final  Office Visit on 12/07/2013  Component Date Value Range Status  . Sodium 12/07/2013 139  135 - 145 mEq/L Final  . Potassium 12/07/2013 4.4  3.5 - 5.1 mEq/L Final  . Chloride 12/07/2013 100  96 - 112 mEq/L Final  . CO2 12/07/2013 31  19 - 32 mEq/L Final  . Glucose, Bld 12/07/2013 86  70 - 99 mg/dL Final  . BUN 12/07/2013 35* 6 - 23 mg/dL Final  . Creatinine, Ser 12/07/2013 1.2  0.4 - 1.2 mg/dL Final  . Calcium 12/07/2013 9.2  8.4 - 10.5 mg/dL Final  . GFR 12/07/2013 44.94* >60.00 mL/min Final  Clinical Support on 11/30/2013  Component Date Value Range Status  . Date Time Interrogation Session 11/30/2013 [id number]   Final  . Pulse Generator Manufacturer 11/30/2013 Medtronic   Final  . Pulse Gen Model 11/30/2013 P1501DR EnRhythm   Final  . Pulse Gen Serial Number 11/30/2013 [id number]  Final  . RV Sense Sensitivity 11/30/2013 0.9   Final  . RV Pace PulseWidth 11/30/2013 0.4   Final  . RV Pace Amplitude 11/30/2013 2.5   Final  . Zone Setting Type Category 11/30/2013 VF   Final  . Zone Setting Type Category 11/30/2013 VT   Final  . Zone Setting Type Category 11/30/2013 VENTRICULAR_TACHYCARDIA_1   Final  . Zone Setting Type Category 11/30/2013 VENTRICULAR_TACHYCARDIA_2   Final  . Zone Detect Interval 11/30/2013 400   Final  . Zone Setting Type Category 11/30/2013 ATRIAL_FIBRILLATION   Final  . Zone Setting Type Category 11/30/2013 ATAF   Final  . Zone Detect Interval 11/30/2013 350   Final  . RA Impedance 11/30/2013 384   Final  . RV IMPEDANCE 11/30/2013 456   Final  . RV Amplitude 11/30/2013 6.14016   Final  . Battery Status 11/30/2013 OK   Final  . Battery Voltage 11/30/2013 2.85   Final  . Loletha Grayer RV Perc Paced 11/30/2013 12.56   Final  . Eval Rhythm 11/30/2013 AF w/Vs   Final  . Miscellaneous Comment 11/30/2013 Pacemaker remote received. Battery voltage 2.85 V. Normal device function. Pt in AF  100% of time. + Warfarin. Carelink 01-01-14 and ROV in February with JA.   Final  Nursing Home on 11/10/2013  Component Date Value Range Status  . Glucose 11/02/2013 220   Final  . BUN 11/02/2013 33* 4 - 21 mg/dL Final  . Creatinine 11/02/2013 1.3* 0.5 - 1.1 mg/dL Final  . Potassium 11/02/2013 3.9  3.4 - 5.3 mmol/L Final  . Sodium 11/02/2013 140  137 - 147 mmol/L Final  . Triglycerides 11/02/2013 142  40 - 160 mg/dL Final  . Cholesterol 11/02/2013 103  0 - 200 mg/dL Final  . HDL 11/02/2013 40  35 - 70 mg/dL Final  . LDL Cholesterol 11/02/2013 35   Final  . Alkaline Phosphatase 11/02/2013 85  25 - 125 U/L Final  . ALT 11/02/2013 10  7 - 35 U/L Final  . AST 11/02/2013 19  13 - 35 U/L Final  . Bilirubin, Total 11/02/2013 1.1   Final  . TSH 11/02/2013 3.23  0.41 - 5.90 uIU/mL Final  Office Visit on 11/02/2013  Component Date Value Range Status  . Glucose-Capillary 11/09/2013 166* 70 - 99 mg/dL Final  . Comment 1 11/09/2013 Notify RN   Final  Clinical Support on 10/26/2013  Component Date Value Range Status  . Date Time Interrogation Session 10/27/2013 [id number]   Final  . Pulse Generator Manufacturer 10/27/2013 Medtronic   Final  . Pulse Gen Model 10/27/2013 P1501DR EnRhythm   Final  . Pulse Gen Serial Number 10/27/2013 [id number]   Final  . RV Sense Sensitivity 10/27/2013 0.9   Final  . RV Pace PulseWidth 10/27/2013 0.4   Final  . RV Pace Amplitude 10/27/2013 2.5   Final  . Zone Setting Type Category 10/27/2013 VF   Final  . Zone Setting Type Category 10/27/2013 VT   Final  . Zone Setting Type Category 10/27/2013 VENTRICULAR_TACHYCARDIA_1   Final  . Zone Setting Type Category 10/27/2013 VENTRICULAR_TACHYCARDIA_2   Final  . Zone Detect Interval 10/27/2013 400   Final  . Zone Setting Type Category 10/27/2013 ATRIAL_FIBRILLATION   Final  . Zone Setting Type Category 10/27/2013 ATAF   Final  . Zone Detect Interval 10/27/2013 350   Final  . RA Impedance 10/27/2013 368   Final  . RV  IMPEDANCE 10/27/2013 424   Final  . RV Amplitude 10/27/2013  5.45792   Final  . Battery Status 10/27/2013 OK   Final  . Battery Voltage 10/27/2013 2.86   Final  . Loletha Grayer RV Perc Paced 10/27/2013 9.45   Final  . Eval Rhythm 10/27/2013 AF w/VS   Final  . Miscellaneous Comment 10/27/2013 Pacemaker remote received. Battery voltage 2.86 V--monthly checks. Pt in AF 100% of time. + Warfarin. Normal device function. Carelink 11-30-13 and ROV in February with JA.   Final  Scanned Document on 10/23/2013  Component Date Value Range Status  . INR 10/12/2013 2.7* 0.9 - 1.1 Final  Office Visit on 10/22/2013  Component Date Value Range Status  . Glucose 10/22/2013 170* 65 - 99 mg/dL Final  . BUN 10/22/2013 24  8 - 27 mg/dL Final  . Creatinine, Ser 10/22/2013 1.24* 0.57 - 1.00 mg/dL Final  . GFR calc non Af Amer 10/22/2013 39* >59 mL/min/1.73 Final  . GFR calc Af Amer 10/22/2013 45* >59 mL/min/1.73 Final  . BUN/Creatinine Ratio 10/22/2013 19  11 - 26 Final  . Sodium 10/22/2013 144  134 - 144 mmol/L Final  . Potassium 10/22/2013 3.8  3.5 - 5.2 mmol/L Final  . Chloride 10/22/2013 99  97 - 108 mmol/L Final  . CO2 10/22/2013 29  18 - 29 mmol/L Final  . Calcium 10/22/2013 8.8  8.6 - 10.2 mg/dL Final  . Total Protein 10/22/2013 5.7* 6.0 - 8.5 g/dL Final  . Albumin 10/22/2013 3.5  3.5 - 4.7 g/dL Final  . Globulin, Total 10/22/2013 2.2  1.5 - 4.5 g/dL Final  . Albumin/Globulin Ratio 10/22/2013 1.6  1.1 - 2.5 Final  . Total Bilirubin 10/22/2013 0.7  0.0 - 1.2 mg/dL Final  . Alkaline Phosphatase 10/22/2013 87  39 - 117 IU/L Final  . AST 10/22/2013 19  0 - 40 IU/L Final  . ALT 10/22/2013 9  0 - 32 IU/L Final  . WBC 10/22/2013 6.5  3.4 - 10.8 x10E3/uL Final  . RBC 10/22/2013 4.41  3.77 - 5.28 x10E6/uL Final  . Hemoglobin 10/22/2013 12.4  11.1 - 15.9 g/dL Final  . HCT 10/22/2013 38.1  34.0 - 46.6 % Final  . MCV 10/22/2013 86  79 - 97 fL Final  . MCH 10/22/2013 28.1  26.6 - 33.0 pg Final  . MCHC 10/22/2013 32.5   31.5 - 35.7 g/dL Final  . RDW 10/22/2013 15.8* 12.3 - 15.4 % Final  . Platelets 10/22/2013 162  150 - 379 x10E3/uL Final  . Neutrophils Relative % 10/22/2013 69   Final  . Lymphs 10/22/2013 18   Final  . Monocytes 10/22/2013 9   Final  . Eos 10/22/2013 3   Final  . Basos 10/22/2013 1   Final  . Neutrophils Absolute 10/22/2013 4.6  1.4 - 7.0 x10E3/uL Final  . Lymphocytes Absolute 10/22/2013 1.2  0.7 - 3.1 x10E3/uL Final  . Monocytes Absolute 10/22/2013 0.6  0.1 - 0.9 x10E3/uL Final  . Eosinophils Absolute 10/22/2013 0.2  0.0 - 0.4 x10E3/uL Final  . Basophils Absolute 10/22/2013 0.0  0.0 - 0.2 x10E3/uL Final  . Immature Granulocytes 10/22/2013 0   Final  . Immature Grans (Abs) 10/22/2013 0.0  0.0 - 0.1 x10E3/uL Final  . Prealbumin 10/22/2013 14* 20 - 40 mg/dL Final      Assessment/Plan  Hypertension: controlled  Atrial fibrillation: stable  Edema: improved  Type II or unspecified type diabetes mellitus with renal manifestations, not stated as uncontrolled: recheck lab next visit  Hyperlipidemia: recheck lab   Congestive heart failure (CHF): improved

## 2014-01-14 DIAGNOSIS — I4891 Unspecified atrial fibrillation: Secondary | ICD-10-CM | POA: Diagnosis not present

## 2014-01-14 DIAGNOSIS — Z7901 Long term (current) use of anticoagulants: Secondary | ICD-10-CM | POA: Diagnosis not present

## 2014-01-14 LAB — PROTIME-INR

## 2014-01-15 ENCOUNTER — Telehealth: Payer: Self-pay | Admitting: *Deleted

## 2014-01-15 NOTE — Telephone Encounter (Signed)
Protime Results from 01/14/2014  PT:  18.4 INR  1.56  Current Dose of Coumadin 4mg  everyday Per Dr. Mariea Clonts:  Increase Coumadin to 5mg  once daily and recheck in one week  01/15/2014--Patient ,Nancy Payne and [name] Notified and faxed to [facility]

## 2014-01-19 NOTE — Addendum Note (Signed)
Addended by: Sharlot Gowda on: 01/19/2014 01:23 PM   Modules accepted: Level of Service

## 2014-01-20 ENCOUNTER — Encounter: Payer: Self-pay | Admitting: *Deleted

## 2014-01-21 ENCOUNTER — Encounter: Payer: Self-pay | Admitting: Internal Medicine

## 2014-01-21 ENCOUNTER — Telehealth: Payer: Self-pay | Admitting: *Deleted

## 2014-01-21 ENCOUNTER — Encounter: Payer: Self-pay | Admitting: Cardiology

## 2014-01-21 DIAGNOSIS — I4891 Unspecified atrial fibrillation: Secondary | ICD-10-CM | POA: Diagnosis not present

## 2014-01-21 DIAGNOSIS — Z7901 Long term (current) use of anticoagulants: Secondary | ICD-10-CM | POA: Diagnosis not present

## 2014-01-21 LAB — PROTIME-INR: INR: 2.5 — AB (ref 0.9–1.1)

## 2014-01-21 NOTE — Progress Notes (Signed)
We Received labs already by fax from [place] from [facility] and have been taken care of

## 2014-01-21 NOTE — Telephone Encounter (Signed)
Protime Results from [facility] 01/21/2014  PT: 26.5 INR:  2.52  Current Dose of Coumadin is 5 mg once daily Per Kyra Searles same dose of Coumadin and recheck in One Week. Patient daughter, Hinton Dyer, Notified and faxed to [place] at [facility]

## 2014-01-21 NOTE — Progress Notes (Signed)
We received fax from [place] at [facility]. Results already taken care of

## 2014-01-27 ENCOUNTER — Encounter: Payer: Self-pay | Admitting: Internal Medicine

## 2014-02-01 ENCOUNTER — Telehealth: Payer: Self-pay | Admitting: *Deleted

## 2014-02-01 DIAGNOSIS — I4891 Unspecified atrial fibrillation: Secondary | ICD-10-CM | POA: Diagnosis not present

## 2014-02-01 DIAGNOSIS — Z7901 Long term (current) use of anticoagulants: Secondary | ICD-10-CM | POA: Diagnosis not present

## 2014-02-01 LAB — PROTIME-INR

## 2014-02-01 NOTE — Telephone Encounter (Signed)
Protime Results 02/01/2014 from [facility]  PT:  29.5 INR: 2.90  Current Dose of Coumadin: 5mg  once daily Per Dr. Mariea Clonts: Continue same dose of Coumadin and recheck in one month  02/01/2014--Patient Notified and called and LM with Dana(daughter) and faxed results to [facility] at [facility]

## 2014-02-04 ENCOUNTER — Encounter: Payer: Self-pay | Admitting: Internal Medicine

## 2014-02-05 ENCOUNTER — Encounter: Payer: Self-pay | Admitting: Internal Medicine

## 2014-02-13 ENCOUNTER — Other Ambulatory Visit: Payer: Self-pay | Admitting: Internal Medicine

## 2014-02-15 ENCOUNTER — Encounter: Payer: Self-pay | Admitting: Internal Medicine

## 2014-02-22 ENCOUNTER — Other Ambulatory Visit: Payer: Self-pay | Admitting: *Deleted

## 2014-02-22 MED ORDER — FUROSEMIDE 40 MG PO TABS: 40.0000 mg | ORAL_TABLET | Freq: Every day | ORAL | Status: DC

## 2014-02-22 NOTE — Telephone Encounter (Signed)
[place]

## 2014-03-01 ENCOUNTER — Telehealth: Payer: Self-pay

## 2014-03-01 DIAGNOSIS — I4891 Unspecified atrial fibrillation: Secondary | ICD-10-CM | POA: Diagnosis not present

## 2014-03-01 LAB — PROTIME-INR: INR: 4.3 — AB (ref 0.9–1.1)

## 2014-03-01 NOTE — Telephone Encounter (Signed)
Patient's current PT/INR 39.9/4.3,taking 5 mg daily. Per Dr.Reed patient to HOLD coumadin M/Tue/Wed and recheck on Thursday, left message on VM for [place] @ [phone] Faxed Results to [place] @ [phone]

## 2014-03-04 ENCOUNTER — Telehealth: Payer: Self-pay | Admitting: *Deleted

## 2014-03-04 DIAGNOSIS — I4891 Unspecified atrial fibrillation: Secondary | ICD-10-CM | POA: Diagnosis not present

## 2014-03-04 DIAGNOSIS — Z7901 Long term (current) use of anticoagulants: Secondary | ICD-10-CM | POA: Diagnosis not present

## 2014-03-04 LAB — PROTIME-INR

## 2014-03-04 NOTE — Telephone Encounter (Signed)
Protime Results from [facility] 03/04/2014  PT:  20.4 INR: 1.79   Was taking 5mg  once daily and is currently Holding her Coumadin since Monday  Per Janett Billow Karam--Start Coumadin 4mg  by mouth once daily and recheck Monday 4/6 Patient daughter, Hinton Dyer, Notified and faxed labs to [facility]

## 2014-03-08 ENCOUNTER — Telehealth: Payer: Self-pay

## 2014-03-08 DIAGNOSIS — Z7901 Long term (current) use of anticoagulants: Secondary | ICD-10-CM | POA: Diagnosis not present

## 2014-03-08 DIAGNOSIS — I4891 Unspecified atrial fibrillation: Secondary | ICD-10-CM | POA: Diagnosis not present

## 2014-03-08 LAB — PROTIME-INR

## 2014-03-08 NOTE — Telephone Encounter (Signed)
Current PT/INR is 2.04, patient is taking 4 mg daily as of 03/04/14. Please advise

## 2014-03-08 NOTE — Telephone Encounter (Signed)
Per Dr.Reed: continue current dose and recheck in 2 weeks. Recommendations were faxed back to [facility] @ [phone]. Left message on VM informing patient of results and recommendations

## 2014-03-12 ENCOUNTER — Ambulatory Visit (INDEPENDENT_AMBULATORY_CARE_PROVIDER_SITE_OTHER): Payer: Medicare Other | Admitting: Internal Medicine

## 2014-03-12 ENCOUNTER — Encounter: Payer: Self-pay | Admitting: Internal Medicine

## 2014-03-12 VITALS — BP 137/72 | HR 77 | Ht 63.0 in | Wt 128.0 lb

## 2014-03-12 DIAGNOSIS — I10 Essential (primary) hypertension: Secondary | ICD-10-CM

## 2014-03-12 DIAGNOSIS — I495 Sick sinus syndrome: Secondary | ICD-10-CM | POA: Diagnosis not present

## 2014-03-12 DIAGNOSIS — I4891 Unspecified atrial fibrillation: Secondary | ICD-10-CM

## 2014-03-12 LAB — MDC_IDC_ENUM_SESS_TYPE_INCLINIC
Battery Voltage: 2.85 V
Brady Statistic RV Percent Paced: 13.34 %
Lead Channel Pacing Threshold Amplitude: 1 V
Lead Channel Pacing Threshold Pulse Width: 0.4 ms
Lead Channel Sensing Intrinsic Amplitude: 2.4103
Lead Channel Setting Pacing Amplitude: 2.5 V
Lead Channel Setting Pacing Pulse Width: 0.4 ms
Lead Channel Setting Sensing Sensitivity: 0.9 mV
MDC IDC MSMT LEADCHNL RA IMPEDANCE VALUE: 368 Ohm
MDC IDC MSMT LEADCHNL RV IMPEDANCE VALUE: 464 Ohm
MDC IDC MSMT LEADCHNL RV SENSING INTR AMPL: 5.4579
MDC IDC SESS DTM: 20150410155819
MDC IDC SET LEADCHNL RA PACING AMPLITUDE: 2 V
MDC IDC SET ZONE DETECTION INTERVAL: 350 ms
Zone Setting Detection Interval: 400 ms

## 2014-03-12 NOTE — Progress Notes (Signed)
PCP:  Estill Dooms, MD Primary Cardiologist:  Dr Martinique  The patient presents today for routine electrophysiology followup.  Since last being seen in our clinic, the patient reports doing very well.   She has converted to sinus rhythm in January.  She reports significant improvement in quality of life and energy since that time.  Her edema continues to improve.  Today, she denies symptoms of palpitations, chest pain, shortness of breath, orthopnea, PND,  dizziness, presyncope, syncope, or neurologic sequela.  The patient feels that she is tolerating medications without difficulties and is otherwise without complaint today.   Past Medical History  Diagnosis Date  . Sick sinus syndrome     post DDD pacemaker  . Hypertension   . Edema   . Type II or unspecified type diabetes mellitus with renal manifestations, not stated as uncontrolled     type 2  . Hyperlipidemia   . Atrial fibrillation     persistent afib/ atrial flutter  . Macular degeneration 2012    left   . Skin cancer of nose 2012    with skin graft  . Kidney disease, chronic, stage III (GFR 30-59 ml/min)   . Eczema   . Xerostomia 2012  . Xerophthalmia 2012  . Brain atrophy 2009  . Congestive heart failure (CHF)    Past Surgical History  Procedure Laterality Date  . Pacemaker insertion  2005  . Cardiac valve surgery  2005    Dr. Ricard Dillon  . Ankle surgery Right 1990    fracture as steel plate   . Knee surgery Right 1990    fracture has plate and rod in femur  . Colonoscopy  2008    Current Outpatient Prescriptions  Medication Sig Dispense Refill  . furosemide (LASIX) 40 MG tablet Take 1 tablet (40 mg total) by mouth daily.  30 tablet  5  . glimepiride (AMARYL) 2 MG tablet Take 1 tablet (2 mg total) by mouth daily with breakfast.  30 tablet  2  . metoprolol tartrate (LOPRESSOR) 25 MG tablet take 1 tablet by mouth twice a day  60 tablet  4  . rosuvastatin (CRESTOR) 10 MG tablet Take one tablet by mouth once daily to  control cholesterol  30 tablet  5  . spironolactone (ALDACTONE) 25 MG tablet TAKE 1 TABLET BY MOUTH EVERY DAY TO HELP CONTROL EDEMA  30 tablet  2  . warfarin (COUMADIN) 1 MG tablet Take 1 tablet (1 mg total) by mouth daily. Take 1 tablet daily.  30 tablet  1  . warfarin (COUMADIN) 2 MG tablet Take as Directed take 2 tablets once daily       No current facility-administered medications for this visit.    Allergies  Allergen Reactions  . Penicillins     History   Social History  . Marital Status: Widowed    Spouse Name: Marcello Moores    Number of Children: N/A  . Years of Education: N/A   Occupational History  . retired Personal assistant    Social History Main Topics  . Smoking status: Never Smoker   . Smokeless tobacco: Never Used  . Alcohol Use: No  . Drug Use: No  . Sexual Activity: No   Other Topics Concern  . Not on file   Social History Narrative   Moved to [facility] 08/30/2012    Family History  Problem Relation Age of Onset  . Stroke Mother   . Cancer Brother   . Anuerysm Sister  ROS-  All systems are reviewed and are negative except as outlined in the HPI above   Physical Exam: Filed Vitals:   03/12/14 1422  BP: 137/72  Pulse: 77  Height: 5\' 3"  (1.6 m)  Weight: 128 lb (58.06 kg)    GEN- The patient is well appearing, alert and oriented x 3 today.   Head- normocephalic, atraumatic Eyes-  Sclera clear, conjunctiva pink Ears- hearing intact Oropharynx- clear Neck- supple, Lungs- Clear to ausculation bilaterally, normal work of breathing Chest- pacemaker pocket is well healed Heart- regular rate and rhythm, no murmurs, rubs or gallops, PMI not laterally displaced GI- soft, NT, ND, + BS Extremities- no clubbing, cyanosis, + venous stasis changes Neuro- strength and sensation are intact  Pacemaker interrogation- reviewed in detail today,  See PACEART report  Assessment and Plan:  1. afib She has spontaneously converted to sinus I have  therefore reprogrammed her device from VVIR to DDDR Continue coumadin long term  2. Sick sinus syndrome Normal pacemaker function See Pace Art report Reprogrammed as above from VVIR to DDDR  3. HTN Stable No change required today  She is approaching ERI We will therefore follow with monthly carelink interrogations  Follow-up with Dr Angelica Chessman as scheduled Return to the device clinic in 1year unless she reaches ERI in the interim

## 2014-03-12 NOTE — Patient Instructions (Signed)
Your physician wants you to follow-up in: 12 months with Dr Vallery Ridge will receive a reminder letter in the mail two months in advance. If you don't receive a letter, please call our office to schedule the follow-up appointment.   Remote monitoring is used to monitor your Pacemaker or ICD from home. This monitoring reduces the number of office visits required to check your device to one time per year. It allows [place] to keep an eye on the functioning of your device to ensure it is working properly. You are scheduled for a device check from home on 04/12/14. You may send your transmission at any time that day. If you have a wireless device, the transmission will be sent automatically. After your physician reviews your transmission, you will receive a postcard with your next transmission date.

## 2014-03-15 ENCOUNTER — Other Ambulatory Visit: Payer: Self-pay | Admitting: Internal Medicine

## 2014-03-16 ENCOUNTER — Encounter: Payer: Self-pay | Admitting: Internal Medicine

## 2014-03-22 ENCOUNTER — Encounter: Payer: Self-pay | Admitting: Nurse Practitioner

## 2014-03-22 ENCOUNTER — Ambulatory Visit (INDEPENDENT_AMBULATORY_CARE_PROVIDER_SITE_OTHER): Payer: Medicare Other | Admitting: Nurse Practitioner

## 2014-03-22 ENCOUNTER — Telehealth: Payer: Self-pay | Admitting: *Deleted

## 2014-03-22 VITALS — BP 110/70 | HR 69 | Ht 63.0 in | Wt 124.4 lb

## 2014-03-22 DIAGNOSIS — I4891 Unspecified atrial fibrillation: Secondary | ICD-10-CM

## 2014-03-22 DIAGNOSIS — I5022 Chronic systolic (congestive) heart failure: Secondary | ICD-10-CM

## 2014-03-22 DIAGNOSIS — I10 Essential (primary) hypertension: Secondary | ICD-10-CM

## 2014-03-22 DIAGNOSIS — I480 Paroxysmal atrial fibrillation: Secondary | ICD-10-CM

## 2014-03-22 DIAGNOSIS — Z7901 Long term (current) use of anticoagulants: Secondary | ICD-10-CM | POA: Diagnosis not present

## 2014-03-22 LAB — PROTIME-INR

## 2014-03-22 NOTE — Progress Notes (Signed)
Nancy Payne Date of Birth: [birth year] Medical Record #[id number]  History of Present Illness: Nancy Payne is seen back today for a follow up visit. Seen for Dr. Martinique. Sees Dr. Rayann Heman for EP. She has multiple issues which include underlying pacemaker for SSS, paroxysmal atrial fib, AS with past AVR, HTN, edema, type 2 DM, HLD, spinal stenosis and chronic LV dysfunction. Echo from April of 2014 showed an EF of 40%.  She underwent AVR with resection and grafting of the proximal descending thoracic aorta and repair of a left ventricular perforation in September of 2005. She has had past right leg fracture.   I saw her in April of 2014 - she was in declining health. Not bathing or caring for herself. Had non healing leg ulcers. We updated her echo and checked dopplers on her legs. Ended up referring her on to the wound clinic. Sees Dr. Nyoka Cowden for primary care.   Last seen by me back in January of 2015. Was having swelling that was treated with Lasix 80 mg and aldactone - which improved.  Overall she was felt to be doing well. Was living at [facility].  Here just 10 days ago with Dr. Rayann Heman - noted to have converted spontaneously to sinus and he reprogrammed her device back to DDDR. She remains on chronic coumadin.   Comes in today. Here with her daughter. She is doing well. No chest pain. Not short of breath. Swelling is controlled with support stockings and low dose diuretic. Sees Dr. Nyoka Cowden next month. Had labs today. She is doing well overall. No falls. Walking more. She has really thrived at [facility].   Current Outpatient Prescriptions  Medication Sig Dispense Refill  . furosemide (LASIX) 40 MG tablet Take 1 tablet (40 mg total) by mouth daily.  30 tablet  5  . glimepiride (AMARYL) 2 MG tablet TAKE 1 TABLET BY MOUTH ONCE DAILY WITH BREAKFAST  30 tablet  3  . metoprolol tartrate (LOPRESSOR) 25 MG tablet take 1 tablet by mouth twice a day  60 tablet  4  . rosuvastatin (CRESTOR) 10 MG  tablet Take one tablet by mouth once daily to control cholesterol  30 tablet  5  . spironolactone (ALDACTONE) 25 MG tablet TAKE 1 TABLET BY MOUTH EVERY DAY TO HELP CONTROL EDEMA  30 tablet  2  . warfarin (COUMADIN) 1 MG tablet Take 1 tablet (1 mg total) by mouth daily. Take 1 tablet daily.  30 tablet  1  . warfarin (COUMADIN) 2 MG tablet Take as Directed take 2 tablets once daily       No current facility-administered medications for this visit.    Allergies  Allergen Reactions  . Penicillins     Past Medical History  Diagnosis Date  . Sick sinus syndrome     post DDD pacemaker  . Hypertension   . Edema   . Type II or unspecified type diabetes mellitus with renal manifestations, not stated as uncontrolled     type 2  . Hyperlipidemia   . Atrial fibrillation     persistent afib/ atrial flutter  . Macular degeneration 2012    left   . Skin cancer of nose 2012    with skin graft  . Kidney disease, chronic, stage III (GFR 30-59 ml/min)   . Eczema   . Xerostomia 2012  . Xerophthalmia 2012  . Brain atrophy 2009  . Congestive heart failure (CHF)     Past Surgical History  Procedure Laterality Date  .  Pacemaker insertion  2005  . Cardiac valve surgery  2005    Dr. Ricard Dillon  . Ankle surgery Right 1990    fracture as steel plate   . Knee surgery Right 1990    fracture has plate and rod in femur  . Colonoscopy  2008    History  Smoking status  . Never Smoker   Smokeless tobacco  . Never Used    History  Alcohol Use No    Family History  Problem Relation Age of Onset  . Stroke Mother   . Cancer Brother   . Anuerysm Sister     Review of Systems: The review of systems is per the HPI.  All other systems were reviewed and are negative.  Physical Exam: BP 110/70  Pulse 69  Ht 5\' 3"  (1.6 m)  Wt 124 lb 6.4 oz (56.427 kg)  BMI 22.04 kg/m2  SpO2 97% Patient is very pleasant and in no acute distress. Skin is warm and dry. Color is normal.  HEENT is unremarkable.  Normocephalic/atraumatic. PERRL. Sclera are nonicteric. Neck is supple. No masses. No JVD. Lungs are clear. Cardiac exam shows a regular rate and rhythm. Valve is crisp. Abdomen is soft. Extremities are without edema. Gait and ROM are intact. No gross neurologic deficits noted.  Wt Readings from Last 3 Encounters:  03/22/14 124 lb 6.4 oz (56.427 kg)  03/12/14 128 lb (58.06 kg)  01/12/14 124 lb (56.246 kg)     LABORATORY DATA:  Lab Results  Component Value Date   WBC 6.5 10/22/2013   HGB 12.4 10/22/2013   HCT 38.1 10/22/2013   PLT 162 10/22/2013   GLUCOSE 86 12/07/2013   CHOL 103 11/02/2013   TRIG 142 11/02/2013   HDL 40 11/02/2013   LDLCALC 35 11/02/2013   ALT 10 11/02/2013   AST 19 11/02/2013   NA 139 12/07/2013   K 4.4 12/07/2013   CL 100 12/07/2013   CREATININE 1.2 12/07/2013   BUN 35* 12/07/2013   CO2 31 12/07/2013   TSH 3.23 11/02/2013   INR 4.3* 03/01/2014    Lab Results  Component Value Date   INR 4.3* 03/01/2014   INR 2.5* 01/21/2014   INR 1.3* 01/04/2014     Echo Study Conclusions from April 2014  - Left ventricle: The cavity size was normal. Wall thickness was increased in a pattern of mild LVH. Systolic function was mildly to moderately reduced. The estimated ejection fraction was in the range of 40% to 45%. There is dyskinesis of the septal myocardium. The study is not technically sufficient to allow evaluation of LV diastolic function. - Aortic valve: A bioprosthesis was present. Trivial regurgitation. - Mitral valve: Calcified annulus. Mildly thickened leaflets . Moderate regurgitation. - Left atrium: The atrium was severely dilated. - Right atrium: The atrium was severely dilated. - Tricuspid valve: Wide-open regurgitation. - Pulmonary arteries: Systolic pressure was mildly increased. PA peak pressure: 82mm Hg (S). - Pericardium, extracardiac: A trivial pericardial effusion was identified. There was a left pleural effusion.    Assessment / Plan: 1. PAF - with  underlying PPM in place - followed by Dr. Rayann Heman.   2. HTN - BP is ok. No change with her current regimen.   3. Chronic coumadin  4. Prior AVR   5. Chronic LV dysfunction - fairly compensated - swelling is controlled with her current regimen. Weight is stable.   6. Advanced age - doing well.   I have left her on her current regimen. Labs  per PCP. See her back in a year unless problems arise.   Patient is agreeable to this plan and will call if any problems develop in the interim.   Burtis Junes, RN, [place] [address] [place], [place]  [address] [phone]

## 2014-03-22 NOTE — Telephone Encounter (Signed)
Received fax from [place] Results from 03/22/2014:  PT:   19.8 INR:  1.75  Current Dose of Coumadin:  4mg  daily Per Dr. Mariea Clonts:  Continue same dose of Coumadin and Recheck Protime in One Week. Will need to increase dosage if still subtherapeutic.  Dana Notified and faxed results to [place] at [facility]

## 2014-03-22 NOTE — Patient Instructions (Signed)
Keep up the good work!  I will see you in a year  Stay on your current medicines  Call the [place] office at [phone] if you have any questions, problems or concerns.

## 2014-03-24 ENCOUNTER — Encounter: Payer: Medicare Other | Admitting: Internal Medicine

## 2014-03-25 ENCOUNTER — Encounter: Payer: Self-pay | Admitting: Internal Medicine

## 2014-03-29 DIAGNOSIS — Z7901 Long term (current) use of anticoagulants: Secondary | ICD-10-CM | POA: Diagnosis not present

## 2014-03-29 DIAGNOSIS — I4891 Unspecified atrial fibrillation: Secondary | ICD-10-CM | POA: Diagnosis not present

## 2014-03-29 LAB — PROTIME-INR

## 2014-03-30 ENCOUNTER — Telehealth: Payer: Self-pay | Admitting: *Deleted

## 2014-03-30 NOTE — Telephone Encounter (Signed)
Protime Results from [facility] 03/29/2014   PT:  21.7 INR:  1.94  Current Dose of Coumadin:  4mg  once daily Per Dr. Reed---Continue same dose of Coumadin and recheck in 1 week  Patient Notified and faxed Labs back to [facility]

## 2014-04-02 ENCOUNTER — Other Ambulatory Visit: Payer: Self-pay | Admitting: Internal Medicine

## 2014-04-05 DIAGNOSIS — I10 Essential (primary) hypertension: Secondary | ICD-10-CM | POA: Diagnosis not present

## 2014-04-05 DIAGNOSIS — E1129 Type 2 diabetes mellitus with other diabetic kidney complication: Secondary | ICD-10-CM | POA: Diagnosis not present

## 2014-04-05 DIAGNOSIS — E785 Hyperlipidemia, unspecified: Secondary | ICD-10-CM | POA: Diagnosis not present

## 2014-04-05 LAB — POCT INR: INR: 2.3 — AB (ref 0.9–1.1)

## 2014-04-05 LAB — LIPID PANEL
Cholesterol: 108 mg/dL (ref 0–200)
HDL: 39 mg/dL (ref 35–70)
LDL CALC: 35 mg/dL
Triglycerides: 172 mg/dL — AB (ref 40–160)

## 2014-04-05 LAB — PROTIME-INR: Protime: 24.8 seconds — AB (ref 10.0–13.8)

## 2014-04-05 LAB — BASIC METABOLIC PANEL
BUN: 41 mg/dL — AB (ref 4–21)
Creatinine: 1.3 mg/dL — AB (ref 0.5–1.1)
GLUCOSE: 188 mg/dL
Potassium: 4.5 mmol/L (ref 3.4–5.3)
Sodium: 139 mmol/L (ref 137–147)

## 2014-04-05 LAB — HEPATIC FUNCTION PANEL
ALT: 11 U/L (ref 7–35)
AST: 18 U/L (ref 13–35)
Alkaline Phosphatase: 61 U/L (ref 25–125)
Bilirubin, Total: 1 mg/dL

## 2014-04-05 LAB — HEMOGLOBIN A1C: Hgb A1c MFr Bld: 7.8 % — AB (ref 4.0–6.0)

## 2014-04-08 ENCOUNTER — Encounter: Payer: Self-pay | Admitting: Internal Medicine

## 2014-04-12 ENCOUNTER — Ambulatory Visit (INDEPENDENT_AMBULATORY_CARE_PROVIDER_SITE_OTHER): Payer: Medicare Other | Admitting: *Deleted

## 2014-04-12 ENCOUNTER — Encounter: Payer: Self-pay | Admitting: Internal Medicine

## 2014-04-12 ENCOUNTER — Telehealth: Payer: Self-pay | Admitting: Cardiology

## 2014-04-12 DIAGNOSIS — I495 Sick sinus syndrome: Secondary | ICD-10-CM

## 2014-04-12 NOTE — Telephone Encounter (Signed)
Spoke with pt and reminded her of home remote transmission that is due today. She verbalized understanding.

## 2014-04-13 ENCOUNTER — Encounter: Payer: Self-pay | Admitting: Internal Medicine

## 2014-04-13 ENCOUNTER — Other Ambulatory Visit: Payer: Self-pay | Admitting: *Deleted

## 2014-04-13 ENCOUNTER — Non-Acute Institutional Stay: Payer: Medicare Other | Admitting: Internal Medicine

## 2014-04-13 VITALS — BP 110/60 | HR 60 | Wt 129.0 lb

## 2014-04-13 DIAGNOSIS — E1129 Type 2 diabetes mellitus with other diabetic kidney complication: Secondary | ICD-10-CM | POA: Diagnosis not present

## 2014-04-13 DIAGNOSIS — R609 Edema, unspecified: Secondary | ICD-10-CM

## 2014-04-13 DIAGNOSIS — I509 Heart failure, unspecified: Secondary | ICD-10-CM

## 2014-04-13 DIAGNOSIS — E785 Hyperlipidemia, unspecified: Secondary | ICD-10-CM | POA: Diagnosis not present

## 2014-04-13 DIAGNOSIS — N183 Chronic kidney disease, stage 3 unspecified: Secondary | ICD-10-CM

## 2014-04-13 DIAGNOSIS — I10 Essential (primary) hypertension: Secondary | ICD-10-CM | POA: Diagnosis not present

## 2014-04-13 DIAGNOSIS — I4891 Unspecified atrial fibrillation: Secondary | ICD-10-CM

## 2014-04-13 MED ORDER — ROSUVASTATIN CALCIUM 10 MG PO TABS: ORAL_TABLET | ORAL | Status: DC

## 2014-04-13 MED ORDER — SPIRONOLACTONE 25 MG PO TABS: ORAL_TABLET | ORAL | Status: DC

## 2014-04-13 MED ORDER — GLIMEPIRIDE 2 MG PO TABS: ORAL_TABLET | ORAL | Status: DC

## 2014-04-13 MED ORDER — METOPROLOL TARTRATE 25 MG PO TABS: ORAL_TABLET | ORAL | Status: DC

## 2014-04-13 NOTE — Progress Notes (Signed)
Patient ID: Nancy Payne, female   DOB: 06/15/1922, 80 y.o.   MRN: [id number]    Location:[facility]  Place of Service: CLINIC    Allergies  Allergen Reactions  . Penicillins     Chief Complaint  Patient presents with  . Medical Management of Chronic Issues    blood pressure, A-Fib, blood sugar, CHF    HPI:  HTN: controlled  Atrial fibrillation: rate controlled. Latest INR is therapeutic.  Congestive heart failure (CHF): compensated  Type II or unspecified type diabetes mellitus with renal manifestations, not stated as uncontrolled: poor control with high fasting glucose and high A1c  Kidney disease, chronic, stage III (GFR 30-59 ml/min): unchanged  Edema: unchanged  Hyperlipidemia: controlled    Medications: Patient's Medications  New Prescriptions   No medications on file  Previous Medications   FUROSEMIDE (LASIX) 40 MG TABLET    Take 1 tablet (40 mg total) by mouth daily.   WARFARIN (COUMADIN) 2 MG TABLET    Take as Directed take 2 tablets once daily  Modified Medications   Modified Medication Previous Medication   GLIMEPIRIDE (AMARYL) 2 MG TABLET glimepiride (AMARYL) 2 MG tablet      Take one tablet by mouth once daily with breakfast to control blood sugar    TAKE 1 TABLET BY MOUTH ONCE DAILY WITH BREAKFAST   METOPROLOL TARTRATE (LOPRESSOR) 25 MG TABLET metoprolol tartrate (LOPRESSOR) 25 MG tablet      Take one tablet by mouth twice daily to control blood pressure    take 1 tablet by mouth twice a day   ROSUVASTATIN (CRESTOR) 10 MG TABLET rosuvastatin (CRESTOR) 10 MG tablet      Take one tablet by mouth once daily to control cholesterol    Take one tablet by mouth once daily to control cholesterol   SPIRONOLACTONE (ALDACTONE) 25 MG TABLET spironolactone (ALDACTONE) 25 MG tablet      Take one tablet by mouth once daily to help control edema    TAKE 1 TABLET BY MOUTH EVERY DAY TO HELP CONTROL EDEMA  Discontinued Medications   WARFARIN (COUMADIN) 1 MG TABLET     TAKE 1 TABLET DAILY     Review of Systems  Constitutional: Positive for activity change and fatigue. Negative for fever, chills, appetite change and unexpected weight change.  HENT: Negative for congestion, ear pain, mouth sores, postnasal drip, rhinorrhea, sinus pressure and sore throat.   Respiratory: Negative for cough, chest tightness and shortness of breath.   Cardiovascular: Negative for chest pain, palpitations and leg swelling.  Gastrointestinal: Negative for nausea, abdominal pain, diarrhea, constipation, blood in stool and abdominal distention.  Endocrine: Negative.   Genitourinary: Negative for dysuria, decreased urine volume and difficulty urinating.  Musculoskeletal: Positive for back pain and gait problem. Negative for joint swelling, myalgias, neck pain and neck stiffness.  Skin:       Weeping legs  Allergic/Immunologic: Negative.   Neurological: Negative for dizziness, tremors, seizures, syncope, facial asymmetry, speech difficulty, weakness, light-headedness and headaches.  Hematological: Negative.   Psychiatric/Behavioral: Negative for confusion, sleep disturbance, self-injury, decreased concentration and agitation. The patient is not nervous/anxious and is not hyperactive.     Filed Vitals:   04/13/14 0928  BP: 110/60  Pulse: 60  Weight: 129 lb (58.514 kg)  SpO2: 94%   Body mass index is 22.86 kg/(m^2).  Physical Exam  Constitutional: She is oriented to person, place, and time. She appears well-developed. No distress.  HENT:  Head: Normocephalic and atraumatic.  Nose: Nose  normal.  Eyes: Conjunctivae and EOM are normal. Pupils are equal, round, and reactive to light. Left eye exhibits no discharge. No scleral icterus.  Neck: Normal range of motion. Neck supple. No JVD present. No tracheal deviation present. No thyromegaly present.  Cardiovascular: Exam reveals no friction rub.   Murmur heard. 3/6 LSB SEM. Pacemaker in left upper chest.  Pulmonary/Chest: No  respiratory distress. She has no wheezes. She has no rales.  Abdominal: Bowel sounds are normal. She exhibits no distension and no mass. There is no tenderness.  Musculoskeletal: Normal range of motion. She exhibits no edema and no tenderness.  Edematous legs bilaterally. Left arm has 2-3+ edema beginning above the elbow. No adenopathy or breast lumps. Pacer is in left chest.  Lymphadenopathy:    She has no cervical adenopathy.  Neurological: She is alert and oriented to person, place, and time. She has normal reflexes. No cranial nerve deficit. Coordination normal.  Skin: Skin is dry. No rash noted. No erythema. No pallor.  Blisters on the legs are covered in compression stocking  Psychiatric: She has a normal mood and affect. Her behavior is normal. Thought content normal.     Labs reviewed: Nursing Home on 04/13/2014  Component Date Value Ref Range Status  . INR 04/05/2014 2.3* 0.9 - 1.1 Final  . Protime 04/05/2014 24.8* 10.0 - 13.8 seconds Final  . Glucose 04/05/2014 188   Final  . BUN 04/05/2014 41* 4 - 21 mg/dL Final  . Creatinine 04/05/2014 1.3* 0.5 - 1.1 mg/dL Final  . Potassium 04/05/2014 4.5  3.4 - 5.3 mmol/L Final  . Sodium 04/05/2014 139  137 - 147 mmol/L Final  . Triglycerides 04/05/2014 172* 40 - 160 mg/dL Final  . Cholesterol 04/05/2014 108  0 - 200 mg/dL Final  . HDL 04/05/2014 39  35 - 70 mg/dL Final  . LDL Cholesterol 04/05/2014 35   Final  . Alkaline Phosphatase 04/05/2014 61  25 - 125 U/L Final  . ALT 04/05/2014 11  7 - 35 U/L Final  . AST 04/05/2014 18  13 - 35 U/L Final  . Bilirubin, Total 04/05/2014 1.0   Final  . Hemoglobin A1C 04/05/2014 7.8* 4.0 - 6.0 % Final  Scanned Document on 03/16/2014  Component Date Value Ref Range Status  . INR 03/01/2014 4.3* 0.9 - 1.1 Final  Office Visit on 03/12/2014  Component Date Value Ref Range Status  . Date Time Interrogation Session 03/12/2014 [id number]   Final  . Pulse Generator Manufacturer 03/12/2014 Medtronic    Final  . Pulse Gen Model 03/12/2014 [id number] EnRhythm   Final  . Pulse Gen Serial Number 03/12/2014 [id number]   Final  . RV Sense Sensitivity 03/12/2014 0.9   Final  . RA Pace Amplitude 03/12/2014 2   Final  . RV Pace PulseWidth 03/12/2014 0.4   Final  . RV Pace Amplitude 03/12/2014 2.5   Final  . Zone Setting Type Category 03/12/2014 VF   Final  . Zone Setting Type Category 03/12/2014 VT   Final  . Zone Setting Type Category 03/12/2014 VENTRICULAR_TACHYCARDIA_1   Final  . Zone Setting Type Category 03/12/2014 VENTRICULAR_TACHYCARDIA_2   Final  . Zone Detect Interval 03/12/2014 400   Final  . Zone Setting Type Category 03/12/2014 ATRIAL_FIBRILLATION   Final  . Zone Setting Type Category 03/12/2014 ATAF   Final  . Zone Detect Interval 03/12/2014 350   Final  . RA Impedance 03/12/2014 368   Final  . RA Amplitude 03/12/2014 2.41032   Final  .  RV IMPEDANCE 03/12/2014 464   Final  . RV Amplitude 03/12/2014 5.45792   Final  . RV Pacing Amplitude 03/12/2014 1.0   Final  . RV Pacing PulseWidth 03/12/2014 0.40   Final  . Battery Status 03/12/2014 OK   Final  . Battery Voltage 03/12/2014 2.85   Final  . Loletha Grayer RV Perc Paced 03/12/2014 13.34   Final  . Eval Rhythm 03/12/2014 SB @ 36   Final  . Miscellaneous Comment 03/12/2014    Final                   Value:Pacemaker check in clinic. Normal device function. Thresholds, sensing, impedances consistent with previous measurements. Device programmed to maximize longevity. Pt in AF 76.7% of time. Pt went back into NSR January time frame---changed mode to DDDR. No                          high ventricular rates noted. Device programmed at appropriate safety margins. Histogram distribution appropriate for patient activity level. Device programmed to optimize intrinsic conduction. Battery voltage 2.85 V--pt aware of nearing ERI. Patient                          enrolled in remote follow-up with monthly checks. Carelink 04-12-14 and ROV in 12 mths w/JA.    Scanned Document on 02/05/2014  Component Date Value Ref Range Status  . INR 01/21/2014 2.5* 0.9 - 1.1 Final  Scanned Document on 01/21/2014  Component Date Value Ref Range Status  . INR 01/04/2014 1.3* 0.9 - 1.1 Final      Assessment/Plan  1. Hypertension controlled  2. Atrial fibrillation Rate controlled  3. Congestive heart failure (CHF) compensated  4. Type II or unspecified type diabetes mellitus with renal manifestations, not stated as uncontrolled Poor control. She admits to dietary noncompliance  5. Kidney disease, chronic, stage III (GFR 30-59 ml/min) unchanged  6. Edema unchanged  7. Hyperlipidemia controlled

## 2014-04-13 NOTE — Progress Notes (Signed)
Remote pacemaker transmission.   

## 2014-04-16 LAB — MDC_IDC_ENUM_SESS_TYPE_REMOTE
Battery Voltage: 2.83 V
Brady Statistic AP VS Percent: 1.83 %
Brady Statistic RA Percent Paced: 77.64 %
Brady Statistic RV Percent Paced: 84.5 %
Date Time Interrogation Session: 20150511213340
Lead Channel Impedance Value: 360 Ohm
Lead Channel Impedance Value: 464 Ohm
Lead Channel Sensing Intrinsic Amplitude: 6.1402
Lead Channel Setting Pacing Amplitude: 2 V
Lead Channel Setting Pacing Pulse Width: 0.4 ms
MDC IDC SET LEADCHNL RV PACING AMPLITUDE: 2.5 V
MDC IDC SET LEADCHNL RV SENSING SENSITIVITY: 0.9 mV
MDC IDC SET ZONE DETECTION INTERVAL: 400 ms
MDC IDC STAT BRADY AP VP PERCENT: 75.81 %
MDC IDC STAT BRADY AS VP PERCENT: 8.69 %
MDC IDC STAT BRADY AS VS PERCENT: 13.67 %
Zone Setting Detection Interval: 350 ms

## 2014-04-19 ENCOUNTER — Encounter: Payer: Self-pay | Admitting: *Deleted

## 2014-04-21 ENCOUNTER — Encounter: Payer: Self-pay | Admitting: Internal Medicine

## 2014-04-21 DIAGNOSIS — H353290 Exudative age-related macular degeneration, unspecified eye, stage unspecified: Secondary | ICD-10-CM | POA: Diagnosis not present

## 2014-04-21 DIAGNOSIS — H01009 Unspecified blepharitis unspecified eye, unspecified eyelid: Secondary | ICD-10-CM | POA: Diagnosis not present

## 2014-04-21 DIAGNOSIS — H04129 Dry eye syndrome of unspecified lacrimal gland: Secondary | ICD-10-CM | POA: Diagnosis not present

## 2014-04-21 DIAGNOSIS — H2510 Age-related nuclear cataract, unspecified eye: Secondary | ICD-10-CM | POA: Diagnosis not present

## 2014-04-21 DIAGNOSIS — H1130 Conjunctival hemorrhage, unspecified eye: Secondary | ICD-10-CM | POA: Diagnosis not present

## 2014-04-26 ENCOUNTER — Other Ambulatory Visit: Payer: Self-pay | Admitting: Internal Medicine

## 2014-04-27 ENCOUNTER — Other Ambulatory Visit: Payer: Self-pay | Admitting: *Deleted

## 2014-04-27 DIAGNOSIS — H35059 Retinal neovascularization, unspecified, unspecified eye: Secondary | ICD-10-CM | POA: Diagnosis not present

## 2014-04-27 DIAGNOSIS — H353290 Exudative age-related macular degeneration, unspecified eye, stage unspecified: Secondary | ICD-10-CM | POA: Diagnosis not present

## 2014-04-27 DIAGNOSIS — H2510 Age-related nuclear cataract, unspecified eye: Secondary | ICD-10-CM | POA: Diagnosis not present

## 2014-04-27 DIAGNOSIS — H4310 Vitreous hemorrhage, unspecified eye: Secondary | ICD-10-CM | POA: Diagnosis not present

## 2014-04-27 DIAGNOSIS — H35359 Cystoid macular degeneration, unspecified eye: Secondary | ICD-10-CM | POA: Diagnosis not present

## 2014-04-27 MED ORDER — WARFARIN SODIUM 2 MG PO TABS: ORAL_TABLET | ORAL | Status: DC

## 2014-04-27 NOTE — Telephone Encounter (Signed)
Can someone look at this? Not sure why I got. Does not look like her coumadin is checked here.

## 2014-04-27 NOTE — Telephone Encounter (Signed)
[facility] [address] 

## 2014-04-27 NOTE — Telephone Encounter (Signed)
error 

## 2014-04-28 ENCOUNTER — Other Ambulatory Visit: Payer: Self-pay | Admitting: Internal Medicine

## 2014-04-29 ENCOUNTER — Encounter: Payer: Medicare Other | Admitting: Cardiology

## 2014-04-30 NOTE — Telephone Encounter (Signed)
?   Refill room completed

## 2014-05-03 ENCOUNTER — Telehealth: Payer: Self-pay | Admitting: *Deleted

## 2014-05-03 DIAGNOSIS — Z7901 Long term (current) use of anticoagulants: Secondary | ICD-10-CM | POA: Diagnosis not present

## 2014-05-03 DIAGNOSIS — I4891 Unspecified atrial fibrillation: Secondary | ICD-10-CM | POA: Diagnosis not present

## 2014-05-03 LAB — PROTIME-INR: INR: 1.6 — AB (ref 0.9–1.1)

## 2014-05-03 NOTE — Telephone Encounter (Signed)
Protime Results from [facility] 05/03/2014  PT:  18.3 INR:  1.55  Current Dose of Coumadin: 4mg  everyday. Patient missed one day taking Coumadin, No antibiotics, No diet change Per Dr. Ivy Lynn 4mg  daily and check Protime in 1 week Patient Notified and faxed to [facility] at [facility]

## 2014-05-10 ENCOUNTER — Telehealth: Payer: Self-pay

## 2014-05-10 DIAGNOSIS — H35059 Retinal neovascularization, unspecified, unspecified eye: Secondary | ICD-10-CM | POA: Diagnosis not present

## 2014-05-10 DIAGNOSIS — H04129 Dry eye syndrome of unspecified lacrimal gland: Secondary | ICD-10-CM | POA: Diagnosis not present

## 2014-05-10 DIAGNOSIS — H3581 Retinal edema: Secondary | ICD-10-CM | POA: Diagnosis not present

## 2014-05-10 DIAGNOSIS — H3530 Unspecified macular degeneration: Secondary | ICD-10-CM | POA: Diagnosis not present

## 2014-05-10 DIAGNOSIS — Z7901 Long term (current) use of anticoagulants: Secondary | ICD-10-CM | POA: Diagnosis not present

## 2014-05-10 DIAGNOSIS — H2510 Age-related nuclear cataract, unspecified eye: Secondary | ICD-10-CM | POA: Diagnosis not present

## 2014-05-10 DIAGNOSIS — H16149 Punctate keratitis, unspecified eye: Secondary | ICD-10-CM | POA: Diagnosis not present

## 2014-05-10 DIAGNOSIS — H353290 Exudative age-related macular degeneration, unspecified eye, stage unspecified: Secondary | ICD-10-CM | POA: Diagnosis not present

## 2014-05-10 DIAGNOSIS — H4310 Vitreous hemorrhage, unspecified eye: Secondary | ICD-10-CM | POA: Diagnosis not present

## 2014-05-10 DIAGNOSIS — I4891 Unspecified atrial fibrillation: Secondary | ICD-10-CM | POA: Diagnosis not present

## 2014-05-10 LAB — PROTIME-INR

## 2014-05-10 NOTE — Telephone Encounter (Signed)
PT/INR 2.0/22.5 Currently taking 4 mg daily Last PT/INR 05/03/14 (1.55/18.3)  Per Dr.Reed have patient continue 4 mg daily and recheck in 2 week.   Patient aware of results and results faxed to [facility] @ [phone]

## 2014-05-12 ENCOUNTER — Encounter: Payer: Self-pay | Admitting: Internal Medicine

## 2014-05-13 ENCOUNTER — Telehealth: Payer: Self-pay | Admitting: Cardiology

## 2014-05-13 ENCOUNTER — Ambulatory Visit (INDEPENDENT_AMBULATORY_CARE_PROVIDER_SITE_OTHER): Payer: Medicare Other | Admitting: *Deleted

## 2014-05-13 ENCOUNTER — Encounter: Payer: Self-pay | Admitting: Internal Medicine

## 2014-05-13 DIAGNOSIS — Z950 Presence of cardiac pacemaker: Secondary | ICD-10-CM

## 2014-05-13 NOTE — Telephone Encounter (Signed)
Spoke with pt and reminded pt of remote transmission that is due today. Pt verbalized understanding.   

## 2014-05-14 NOTE — Progress Notes (Signed)
Remote pacemaker transmission.   

## 2014-05-19 ENCOUNTER — Encounter: Payer: Self-pay | Admitting: Internal Medicine

## 2014-05-24 DIAGNOSIS — I4891 Unspecified atrial fibrillation: Secondary | ICD-10-CM | POA: Diagnosis not present

## 2014-05-24 DIAGNOSIS — Z7901 Long term (current) use of anticoagulants: Secondary | ICD-10-CM | POA: Diagnosis not present

## 2014-05-24 LAB — PROTIME-INR

## 2014-05-25 ENCOUNTER — Telehealth: Payer: Self-pay

## 2014-05-25 LAB — MDC_IDC_ENUM_SESS_TYPE_REMOTE
Battery Voltage: 2.82 V
Brady Statistic AP VP Percent: 0.22 %
Brady Statistic RV Percent Paced: 17.78 %
Date Time Interrogation Session: 20150611232812
Lead Channel Sensing Intrinsic Amplitude: 6.1402
Lead Channel Setting Pacing Amplitude: 2.5 V
MDC IDC MSMT LEADCHNL RA IMPEDANCE VALUE: 416 Ohm
MDC IDC MSMT LEADCHNL RV IMPEDANCE VALUE: 464 Ohm
MDC IDC SET LEADCHNL RA PACING AMPLITUDE: 2 V
MDC IDC SET LEADCHNL RV PACING PULSEWIDTH: 0.4 ms
MDC IDC SET LEADCHNL RV SENSING SENSITIVITY: 0.9 mV
MDC IDC SET ZONE DETECTION INTERVAL: 350 ms
MDC IDC STAT BRADY AP VS PERCENT: 0.05 %
MDC IDC STAT BRADY AS VP PERCENT: 17.56 %
MDC IDC STAT BRADY AS VS PERCENT: 82.17 %
MDC IDC STAT BRADY RA PERCENT PACED: 0.27 %
Zone Setting Detection Interval: 400 ms

## 2014-05-25 NOTE — Telephone Encounter (Signed)
05/24/14 INR 1.58 current dose of Coumadin 4mg . Per Dr. Mariea Clonts continue  same dose and recheck in one week. Called patient, fax lab to [facility] clinic nurse.

## 2014-05-26 ENCOUNTER — Other Ambulatory Visit: Payer: Self-pay | Admitting: *Deleted

## 2014-05-26 ENCOUNTER — Other Ambulatory Visit: Payer: Self-pay | Admitting: Internal Medicine

## 2014-05-26 MED ORDER — WARFARIN SODIUM 4 MG PO TABS: 4.0000 mg | ORAL_TABLET | Freq: Every day | ORAL | Status: DC

## 2014-05-27 ENCOUNTER — Encounter: Payer: Self-pay | Admitting: Internal Medicine

## 2014-05-28 ENCOUNTER — Encounter: Payer: Medicare Other | Admitting: Internal Medicine

## 2014-05-31 ENCOUNTER — Telehealth: Payer: Self-pay | Admitting: *Deleted

## 2014-05-31 DIAGNOSIS — I4891 Unspecified atrial fibrillation: Secondary | ICD-10-CM | POA: Diagnosis not present

## 2014-05-31 DIAGNOSIS — Z7901 Long term (current) use of anticoagulants: Secondary | ICD-10-CM | POA: Diagnosis not present

## 2014-05-31 LAB — PROTIME-INR: INR: 1.6 — AB (ref 0.9–1.1)

## 2014-05-31 NOTE — Telephone Encounter (Signed)
Protime Results from 05/31/2014  INR:  1.62 PT: 19.4  Current Dose of Coumadin: 4mg  once daily Per Dr. Sharol Harness Coumadin to 4mg  M/W/F, 5mg  Tues/Thurs/Sat/Sun Patient Notified and faxed to [facility]

## 2014-06-02 ENCOUNTER — Encounter: Payer: Self-pay | Admitting: Internal Medicine

## 2014-06-02 ENCOUNTER — Ambulatory Visit (INDEPENDENT_AMBULATORY_CARE_PROVIDER_SITE_OTHER): Payer: Medicare Other | Admitting: Internal Medicine

## 2014-06-02 VITALS — BP 140/72 | HR 81 | Ht 63.0 in | Wt 124.4 lb

## 2014-06-02 DIAGNOSIS — I10 Essential (primary) hypertension: Secondary | ICD-10-CM | POA: Diagnosis not present

## 2014-06-02 DIAGNOSIS — I480 Paroxysmal atrial fibrillation: Secondary | ICD-10-CM

## 2014-06-02 DIAGNOSIS — I4891 Unspecified atrial fibrillation: Secondary | ICD-10-CM

## 2014-06-02 DIAGNOSIS — I4819 Other persistent atrial fibrillation: Secondary | ICD-10-CM

## 2014-06-02 DIAGNOSIS — I495 Sick sinus syndrome: Secondary | ICD-10-CM

## 2014-06-02 DIAGNOSIS — I498 Other specified cardiac arrhythmias: Secondary | ICD-10-CM

## 2014-06-02 LAB — MDC_IDC_ENUM_SESS_TYPE_INCLINIC
Battery Voltage: 2.82 V
Brady Statistic AP VP Percent: 27.94 %
Brady Statistic AS VS Percent: 58.15 %
Brady Statistic RA Percent Paced: 28.66 %
Brady Statistic RV Percent Paced: 41.14 %
Date Time Interrogation Session: 20150701184600
Lead Channel Impedance Value: 464 Ohm
Lead Channel Pacing Threshold Pulse Width: 0.4 ms
Lead Channel Setting Pacing Amplitude: 2.5 V
Lead Channel Setting Sensing Sensitivity: 0.9 mV
MDC IDC MSMT LEADCHNL RA IMPEDANCE VALUE: 400 Ohm
MDC IDC MSMT LEADCHNL RA SENSING INTR AMPL: 2.9362
MDC IDC MSMT LEADCHNL RV PACING THRESHOLD AMPLITUDE: 1 V
MDC IDC MSMT LEADCHNL RV SENSING INTR AMPL: 6.1402
MDC IDC SET LEADCHNL RA PACING AMPLITUDE: 2 V
MDC IDC SET LEADCHNL RV PACING PULSEWIDTH: 0.4 ms
MDC IDC STAT BRADY AP VS PERCENT: 0.71 %
MDC IDC STAT BRADY AS VP PERCENT: 13.19 %
Zone Setting Detection Interval: 350 ms
Zone Setting Detection Interval: 400 ms

## 2014-06-02 NOTE — Patient Instructions (Addendum)
Your physician wants you to follow-up in: 6 months with Truitt Merle, NP and 12 months with Dr Vallery Ridge will receive a reminder letter in the mail two months in advance. If you don't receive a letter, please call our office to schedule the follow-up appointment.   Remote monitoring is used to monitor your Pacemaker or ICD from home. This monitoring reduces the number of office visits required to check your device to one time per year. It allows [place] to keep an eye on the functioning of your device to ensure it is working properly. You are scheduled for a device check from home on 07/05/14. You may send your transmission at any time that day. If you have a wireless device, the transmission will be sent automatically. After your physician reviews your transmission, you will receive a postcard with your next transmission date.

## 2014-06-07 DIAGNOSIS — I4891 Unspecified atrial fibrillation: Secondary | ICD-10-CM | POA: Diagnosis not present

## 2014-06-07 DIAGNOSIS — Z7901 Long term (current) use of anticoagulants: Secondary | ICD-10-CM | POA: Diagnosis not present

## 2014-06-07 LAB — PROTIME-INR

## 2014-06-07 NOTE — Progress Notes (Signed)
PCP:  Estill Dooms, MD Primary Cardiologist:  Dr Martinique  The patient presents today for routine electrophysiology followup.  Since last being seen in our clinic, the patient reports doing very well.   She has returned to an atypical atrial flutter but is currently asymptomatic with this.  Today, she denies symptoms of palpitations, chest pain, shortness of breath, orthopnea, PND,  dizziness, presyncope, syncope, or neurologic sequela.  Her edema is stable  The patient feels that she is tolerating medications without difficulties and is otherwise without complaint today.   Past Medical History  Diagnosis Date  . Sick sinus syndrome     post DDD pacemaker  . Hypertension   . Edema   . Type II or unspecified type diabetes mellitus with renal manifestations, not stated as uncontrolled     type 2  . Hyperlipidemia   . Atrial fibrillation     persistent afib/ atrial flutter  . Macular degeneration 2012    left   . Skin cancer of nose 2012    with skin graft  . Kidney disease, chronic, stage III (GFR 30-59 ml/min)   . Eczema   . Xerostomia 2012  . Xerophthalmia 2012  . Brain atrophy 2009  . Congestive heart failure (CHF)    Past Surgical History  Procedure Laterality Date  . Pacemaker insertion  2005  . Cardiac valve surgery  2005    Dr. Ricard Dillon  . Ankle surgery Right 1990    fracture as steel plate   . Knee surgery Right 1990    fracture has plate and rod in femur  . Colonoscopy  2008    Current Outpatient Prescriptions  Medication Sig Dispense Refill  . furosemide (LASIX) 40 MG tablet Take 1 tablet (40 mg total) by mouth daily.  30 tablet  5  . glimepiride (AMARYL) 2 MG tablet Take one tablet by mouth once daily with breakfast to control blood sugar  90 tablet  3  . metoprolol tartrate (LOPRESSOR) 25 MG tablet Take one tablet by mouth twice daily to control blood pressure  180 tablet  3  . rosuvastatin (CRESTOR) 10 MG tablet Take one tablet by mouth once daily to control  cholesterol  90 tablet  3  . spironolactone (ALDACTONE) 25 MG tablet Take one tablet by mouth once daily to help control edema  90 tablet  3  . warfarin (COUMADIN) 2.5 MG tablet Take as directed by the coumadin clinic      . warfarin (COUMADIN) 4 MG tablet Take as directed by the coumadin clinic       No current facility-administered medications for this visit.    Allergies  Allergen Reactions  . Penicillins Other (See Comments)    Temperature went up to 104 F    History   Social History  . Marital Status: Widowed    Spouse Name: Marcello Moores    Number of Children: N/A  . Years of Education: N/A   Occupational History  . retired Personal assistant    Social History Main Topics  . Smoking status: Never Smoker   . Smokeless tobacco: Never Used  . Alcohol Use: No  . Drug Use: No  . Sexual Activity: No   Other Topics Concern  . Not on file   Social History Narrative   Moved to [facility] 08/30/2012   Widowed 2013   Walks with walker   Exercise walking          Family History  Problem Relation Age  of Onset  . Stroke Mother   . Cancer Brother   . Anuerysm Sister     ROS-  All systems are reviewed and are negative except as outlined in the HPI above   Physical Exam: Filed Vitals:   06/02/14 1425  BP: 140/72  Pulse: 81  Height: 5\' 3"  (1.6 m)  Weight: 124 lb 6.4 oz (56.427 kg)    GEN- The patient is well appearing, alert and oriented x 3 today.   Head- normocephalic, atraumatic Eyes-  Sclera clear, conjunctiva pink Ears- hearing intact Oropharynx- clear Neck- supple, Lungs- Clear to ausculation bilaterally, normal work of breathing Chest- pacemaker pocket is well healed Heart- irregular rate and rhythm, no murmurs, rubs or gallops, PMI not laterally displaced GI- soft, NT, ND, + BS Extremities- no clubbing, cyanosis, + venous stasis changes Neuro- strength and sensation are intact  Pacemaker interrogation- reviewed in detail today,  See PACEART  report  Assessment and Plan:  1. Afib/ atypical flutter She has returned to atypical atria flutter but is asymptomatic Continue coumadin long term  2. Sick sinus syndrome Normal pacemaker function See Pace Art report No changes today  3. HTN Stable No change required today  She is approaching ERI We will therefore follow with monthly carelink interrogations  Follow-up with Dr Angelica Chessman as scheduled Return to the device clinic in 1year unless she reaches ERI in the interim

## 2014-06-09 ENCOUNTER — Encounter: Payer: Self-pay | Admitting: Internal Medicine

## 2014-06-10 ENCOUNTER — Telehealth: Payer: Self-pay | Admitting: *Deleted

## 2014-06-10 DIAGNOSIS — Z7901 Long term (current) use of anticoagulants: Secondary | ICD-10-CM | POA: Diagnosis not present

## 2014-06-10 DIAGNOSIS — I4891 Unspecified atrial fibrillation: Secondary | ICD-10-CM | POA: Diagnosis not present

## 2014-06-10 LAB — PROTIME-INR

## 2014-06-10 NOTE — Telephone Encounter (Signed)
Protime Results from [facility] 06/10/2014  PT:   20.5 INR:  1.74  Current Dose of Coumadin is 4mg  M/W/F, 5mg  T/Thurs/Sat/Sun Per Dr. Reed--Continue current dose of Coumadin and Recheck INR in 1 week  06/10/2014--LMOM to return call Patient Notified.

## 2014-06-11 ENCOUNTER — Encounter: Payer: Self-pay | Admitting: Internal Medicine

## 2014-06-16 ENCOUNTER — Telehealth: Payer: Self-pay | Admitting: *Deleted

## 2014-06-16 ENCOUNTER — Encounter: Payer: Self-pay | Admitting: Internal Medicine

## 2014-06-16 NOTE — Telephone Encounter (Signed)
Nancy Payne with [facility] called wanting to know directions for next Protime Draw, she stated that she was out sick 3 days and never received it. Patient to have Protime drawn in 1 week. Called and left message with Milus Banister regarding the direction of protime

## 2014-06-17 DIAGNOSIS — Z7901 Long term (current) use of anticoagulants: Secondary | ICD-10-CM | POA: Diagnosis not present

## 2014-06-17 DIAGNOSIS — I4891 Unspecified atrial fibrillation: Secondary | ICD-10-CM | POA: Diagnosis not present

## 2014-06-17 LAB — PROTIME-INR

## 2014-06-21 ENCOUNTER — Encounter: Payer: Self-pay | Admitting: Internal Medicine

## 2014-06-23 ENCOUNTER — Other Ambulatory Visit: Payer: Self-pay | Admitting: Internal Medicine

## 2014-06-24 ENCOUNTER — Encounter: Payer: Self-pay | Admitting: Internal Medicine

## 2014-06-24 ENCOUNTER — Telehealth: Payer: Self-pay

## 2014-06-24 DIAGNOSIS — Z7901 Long term (current) use of anticoagulants: Secondary | ICD-10-CM | POA: Diagnosis not present

## 2014-06-24 DIAGNOSIS — I4891 Unspecified atrial fibrillation: Secondary | ICD-10-CM | POA: Diagnosis not present

## 2014-06-24 LAB — PROTIME-INR

## 2014-06-24 NOTE — Telephone Encounter (Signed)
PT/INR 20.6/1.75, Current dose 5 mg daily except 4 mg on M/W/F per Deberah Pelton patient to change to 5 mg daily except 4 mg on M/F and recheck in 1 week.  Left message on voicemail for Milus Banister at [facility], faxed response as well to [phone].  I called patient to inform her of the results and patient states she is currently taking 5 mg daily and 4 mg on M/F.Patient stated she is going out of town on 06/26/14 and will return on 07/03/14.  Deberah Pelton re-advised and stated have patient take 5 mg daily and recheck on 07/05/2014

## 2014-06-25 NOTE — Telephone Encounter (Signed)
Results was re-faxed to [facility] with new instructions of 5 mg daily and recheck on 07/05/14

## 2014-07-05 ENCOUNTER — Ambulatory Visit (INDEPENDENT_AMBULATORY_CARE_PROVIDER_SITE_OTHER): Payer: Medicare Other | Admitting: *Deleted

## 2014-07-05 ENCOUNTER — Telehealth: Payer: Self-pay | Admitting: *Deleted

## 2014-07-05 ENCOUNTER — Encounter: Payer: Self-pay | Admitting: Internal Medicine

## 2014-07-05 DIAGNOSIS — I495 Sick sinus syndrome: Secondary | ICD-10-CM

## 2014-07-05 DIAGNOSIS — Z7901 Long term (current) use of anticoagulants: Secondary | ICD-10-CM | POA: Diagnosis not present

## 2014-07-05 DIAGNOSIS — I4891 Unspecified atrial fibrillation: Secondary | ICD-10-CM | POA: Diagnosis not present

## 2014-07-05 LAB — MDC_IDC_ENUM_SESS_TYPE_REMOTE
Battery Voltage: 2.81 V
Brady Statistic AS VS Percent: 48.38 %
Brady Statistic RV Percent Paced: 51.61 %
Date Time Interrogation Session: 20150803170823
Lead Channel Impedance Value: 448 Ohm
Lead Channel Sensing Intrinsic Amplitude: 5.799
Lead Channel Setting Pacing Amplitude: 2.5 V
Lead Channel Setting Sensing Sensitivity: 0.9 mV
MDC IDC MSMT LEADCHNL RV IMPEDANCE VALUE: 456 Ohm
MDC IDC SET LEADCHNL RV PACING PULSEWIDTH: 0.4 ms
MDC IDC STAT BRADY AP VP PERCENT: 0.04 %
MDC IDC STAT BRADY AP VS PERCENT: 0.01 %
MDC IDC STAT BRADY AS VP PERCENT: 51.57 %
MDC IDC STAT BRADY RA PERCENT PACED: 0.04 %
Zone Setting Detection Interval: 350 ms
Zone Setting Detection Interval: 400 ms

## 2014-07-05 LAB — PROTIME-INR

## 2014-07-05 NOTE — Telephone Encounter (Signed)
Protime Results from [facility] 07/05/2014  PT:  26.9 INR: 2.49  Current Dose of Coumadin is 5 mg daily Per Dr. Reed--Continue current dose of Coumadin and recheck in 2 weeks Patient Notified and faxed to [facility]

## 2014-07-06 ENCOUNTER — Encounter: Payer: Self-pay | Admitting: Internal Medicine

## 2014-07-07 NOTE — Progress Notes (Signed)
Remote pacemaker transmission.   

## 2014-07-13 ENCOUNTER — Encounter: Payer: Self-pay | Admitting: Internal Medicine

## 2014-07-14 ENCOUNTER — Telehealth: Payer: Self-pay | Admitting: *Deleted

## 2014-07-14 ENCOUNTER — Encounter (HOSPITAL_COMMUNITY): Payer: Self-pay | Admitting: Pharmacy Technician

## 2014-07-14 ENCOUNTER — Encounter: Payer: Self-pay | Admitting: Internal Medicine

## 2014-07-14 ENCOUNTER — Encounter: Payer: Self-pay | Admitting: *Deleted

## 2014-07-14 ENCOUNTER — Ambulatory Visit (INDEPENDENT_AMBULATORY_CARE_PROVIDER_SITE_OTHER): Payer: Medicare Other | Admitting: Internal Medicine

## 2014-07-14 VITALS — BP 135/79 | HR 72 | Ht 63.0 in | Wt 125.0 lb

## 2014-07-14 DIAGNOSIS — I10 Essential (primary) hypertension: Secondary | ICD-10-CM | POA: Diagnosis not present

## 2014-07-14 DIAGNOSIS — I498 Other specified cardiac arrhythmias: Secondary | ICD-10-CM | POA: Diagnosis not present

## 2014-07-14 DIAGNOSIS — I4891 Unspecified atrial fibrillation: Secondary | ICD-10-CM

## 2014-07-14 DIAGNOSIS — I495 Sick sinus syndrome: Secondary | ICD-10-CM

## 2014-07-14 DIAGNOSIS — R001 Bradycardia, unspecified: Secondary | ICD-10-CM

## 2014-07-14 DIAGNOSIS — Z01812 Encounter for preprocedural laboratory examination: Secondary | ICD-10-CM

## 2014-07-14 LAB — CBC
HCT: 40.5 % (ref 36.0–46.0)
Hemoglobin: 13.9 g/dL (ref 12.0–15.0)
MCHC: 34.2 g/dL (ref 30.0–36.0)
MCV: 93.4 fl (ref 78.0–100.0)
PLATELETS: 183 10*3/uL (ref 150.0–400.0)
RBC: 4.34 Mil/uL (ref 3.87–5.11)
RDW: 14 % (ref 11.5–15.5)
WBC: 7.3 10*3/uL (ref 4.0–10.5)

## 2014-07-14 LAB — PROTIME-INR
INR: 2.5 ratio — ABNORMAL HIGH (ref 0.8–1.0)
PROTHROMBIN TIME: 26.7 s — AB (ref 9.6–13.1)

## 2014-07-14 LAB — BASIC METABOLIC PANEL
BUN: 31 mg/dL — AB (ref 6–23)
CALCIUM: 9.6 mg/dL (ref 8.4–10.5)
CHLORIDE: 99 meq/L (ref 96–112)
CO2: 32 mEq/L (ref 19–32)
Creatinine, Ser: 1.3 mg/dL — ABNORMAL HIGH (ref 0.4–1.2)
GFR: 41.29 mL/min — AB (ref >60.00)
Glucose, Bld: 104 mg/dL — ABNORMAL HIGH (ref 70–99)
Potassium: 5.3 mEq/L — ABNORMAL HIGH (ref 3.5–5.1)
Sodium: 138 mEq/L (ref 135–145)

## 2014-07-14 LAB — APTT: aPTT: 42.7 s — ABNORMAL HIGH (ref 23.4–32.7)

## 2014-07-14 NOTE — Patient Instructions (Signed)
Your physician recommends that you continue on your current medications as directed. Please refer to the Current Medication list given to you today. Your physician recommends that you return for lab work today.  (Pre procedure labs, PT, PTT, INR, CBC, BMET)

## 2014-07-14 NOTE — Telephone Encounter (Signed)
Left voicemail stating do not take spironolactone tomorrow.  Will try to reach her tomorrow to clarify.

## 2014-07-14 NOTE — Telephone Encounter (Signed)
No answer at home number.  Did not leave voice mail message. No answer on mobile #.  No voice mail message left.  Per Dr. Rayann Heman:  For K+ 5.3--be sure pt is not taking any potassium, decrease spironolactone to 1/2 tablet daily For INR 2.5--hold coumadin this sat and sun, aug 15 and 16 Patient is for generator change on mon aug 17.

## 2014-07-14 NOTE — Progress Notes (Signed)
PCP:  Estill Dooms, MD Primary Cardiologist:  Dr Martinique  The patient presents today for routine electrophysiology followup.  Since last being seen in our clinic, the patient reports doing very well.  She remains asymptomatic with her atrial arrhythmias.  Today, she denies symptoms of palpitations, chest pain, shortness of breath, orthopnea, PND,  dizziness, presyncope, syncope, or neurologic sequela.  Her edema is stable  The patient feels that she is tolerating medications without difficulties and is otherwise without complaint today.   Past Medical History  Diagnosis Date  . Sick sinus syndrome     post DDD pacemaker  . Hypertension   . Edema   . Type II or unspecified type diabetes mellitus with renal manifestations, not stated as uncontrolled     type 2  . Hyperlipidemia   . Atrial fibrillation     persistent afib/ atrial flutter  . Macular degeneration 2012    left   . Skin cancer of nose 2012    with skin graft  . Kidney disease, chronic, stage III (GFR 30-59 ml/min)   . Eczema   . Xerostomia 2012  . Xerophthalmia 2012  . Brain atrophy 2009  . Congestive heart failure (CHF)    Past Surgical History  Procedure Laterality Date  . Pacemaker insertion  2005  . Cardiac valve surgery  2005    Dr. Ricard Dillon  . Ankle surgery Right 1990    fracture as steel plate   . Knee surgery Right 1990    fracture has plate and rod in femur  . Colonoscopy  2008    Current Outpatient Prescriptions  Medication Sig Dispense Refill  . furosemide (LASIX) 40 MG tablet Take 40 mg by mouth daily.      Marland Kitchen glimepiride (AMARYL) 2 MG tablet Take 2 mg by mouth daily with breakfast.      . metoprolol tartrate (LOPRESSOR) 25 MG tablet Take 25 mg by mouth 2 (two) times daily.      . Multiple Vitamins-Minerals (MULTIVITAMIN PO) Take 1 tablet by mouth daily.      . rosuvastatin (CRESTOR) 10 MG tablet Take 10 mg by mouth at bedtime.      Marland Kitchen spironolactone (ALDACTONE) 25 MG tablet Take 25 mg by mouth daily.       Marland Kitchen warfarin (COUMADIN) 2.5 MG tablet Take 5 mg by mouth daily.       No current facility-administered medications for this visit.    Allergies  Allergen Reactions  . Penicillins Other (See Comments)    Temperature went up to 104 F    History   Social History  . Marital Status: Widowed    Spouse Name: Marcello Moores    Number of Children: N/A  . Years of Education: N/A   Occupational History  . retired Personal assistant    Social History Main Topics  . Smoking status: Never Smoker   . Smokeless tobacco: Never Used  . Alcohol Use: No  . Drug Use: No  . Sexual Activity: No   Other Topics Concern  . Not on file   Social History Narrative   Moved to [facility] 08/30/2012   Widowed 2013   Walks with walker   Exercise walking          Family History  Problem Relation Age of Onset  . Stroke Mother   . Cancer Brother   . Anuerysm Sister     ROS-  All systems are reviewed and are negative except as outlined in the  HPI above   Physical Exam: Filed Vitals:   07/14/14 1231  BP: 135/79  Pulse: 72  Height: 5\' 3"  (1.6 m)  Weight: 125 lb (56.7 kg)    GEN- The patient is well appearing, alert and oriented x 3 today.   Head- normocephalic, atraumatic Eyes-  Sclera clear, conjunctiva pink Ears- hearing intact Oropharynx- clear Neck- supple, Lungs- Clear to ausculation bilaterally, normal work of breathing Chest- pacemaker pocket is well healed Heart- irregular rate and rhythm, no murmurs, rubs or gallops, PMI not laterally displaced GI- soft, NT, ND, + BS Extremities- no clubbing, cyanosis, + venous stasis changes Neuro- strength and sensation are intact  Pacemaker interrogation- reviewed in detail today,  See PACEART report  Assessment and Plan:  1. Afib/ atypical flutter She has returned to atypical atria flutter but is asymptomatic Continue coumadin long term  2. Sick sinus syndrome Her pacemaker has reached ERI battery status Risks, benefits, and  alternatives to PPM generator change were discussed at length with the patient who wishes to proceed. We will schedule the procedure at the next available time. Would hold coumadin for 24 hours prior to the procedure.  3. HTN Stable No change required today

## 2014-07-15 ENCOUNTER — Other Ambulatory Visit: Payer: Self-pay | Admitting: *Deleted

## 2014-07-15 NOTE — Telephone Encounter (Signed)
Pt made aware to take 1/2 spironolactone (12.5 mg daily), and to reduce dietary potassium. She is not taking any potassium supplement. Made aware to hold coumadin 8/15-8/16. She verbalizes understanding.

## 2014-07-16 ENCOUNTER — Telehealth: Payer: Self-pay | Admitting: Internal Medicine

## 2014-07-16 NOTE — Telephone Encounter (Addendum)
Called pt back and spoke with pts caregiver [name] while pt was by her side. Clarified med changes with patient and caregiver.

## 2014-07-16 NOTE — Telephone Encounter (Signed)
New message     Nurse called patient earlier this week to talk about dosage of spironolactone.  Please call and verify dosage.

## 2014-07-18 DIAGNOSIS — I495 Sick sinus syndrome: Secondary | ICD-10-CM | POA: Diagnosis not present

## 2014-07-18 DIAGNOSIS — I509 Heart failure, unspecified: Secondary | ICD-10-CM | POA: Diagnosis not present

## 2014-07-18 DIAGNOSIS — Z7901 Long term (current) use of anticoagulants: Secondary | ICD-10-CM | POA: Diagnosis not present

## 2014-07-18 DIAGNOSIS — E1129 Type 2 diabetes mellitus with other diabetic kidney complication: Secondary | ICD-10-CM | POA: Diagnosis not present

## 2014-07-18 DIAGNOSIS — I4892 Unspecified atrial flutter: Secondary | ICD-10-CM | POA: Diagnosis not present

## 2014-07-18 DIAGNOSIS — N183 Chronic kidney disease, stage 3 unspecified: Secondary | ICD-10-CM | POA: Diagnosis not present

## 2014-07-18 DIAGNOSIS — E785 Hyperlipidemia, unspecified: Secondary | ICD-10-CM | POA: Diagnosis not present

## 2014-07-18 DIAGNOSIS — I4891 Unspecified atrial fibrillation: Secondary | ICD-10-CM | POA: Diagnosis not present

## 2014-07-18 DIAGNOSIS — I129 Hypertensive chronic kidney disease with stage 1 through stage 4 chronic kidney disease, or unspecified chronic kidney disease: Secondary | ICD-10-CM | POA: Diagnosis not present

## 2014-07-18 DIAGNOSIS — Z45018 Encounter for adjustment and management of other part of cardiac pacemaker: Secondary | ICD-10-CM | POA: Diagnosis not present

## 2014-07-18 MED ORDER — VANCOMYCIN HCL IN DEXTROSE 1-5 GM/200ML-% IV SOLN
1000.0000 mg | INTRAVENOUS | Status: DC
  Filled 2014-07-18: qty 200

## 2014-07-18 MED ORDER — SODIUM CHLORIDE 0.9 % IR SOLN
80.0000 mg | Status: DC
  Filled 2014-07-18: qty 2

## 2014-07-19 ENCOUNTER — Encounter (HOSPITAL_COMMUNITY)
Admission: RE | Disposition: A | Discharge status: Home / Self Care | Payer: Self-pay | Source: Ambulatory Visit | Attending: Internal Medicine

## 2014-07-19 ENCOUNTER — Ambulatory Visit (HOSPITAL_COMMUNITY)
Admission: RE | Admit: 2014-07-19 | Discharge: 2014-07-19 | Disposition: A | Discharge status: Home / Self Care | Payer: Medicare Other | Source: Ambulatory Visit | Attending: Internal Medicine | Admitting: Internal Medicine

## 2014-07-19 DIAGNOSIS — N183 Chronic kidney disease, stage 3 unspecified: Secondary | ICD-10-CM | POA: Insufficient documentation

## 2014-07-19 DIAGNOSIS — I129 Hypertensive chronic kidney disease with stage 1 through stage 4 chronic kidney disease, or unspecified chronic kidney disease: Secondary | ICD-10-CM | POA: Insufficient documentation

## 2014-07-19 DIAGNOSIS — Z45018 Encounter for adjustment and management of other part of cardiac pacemaker: Secondary | ICD-10-CM | POA: Insufficient documentation

## 2014-07-19 DIAGNOSIS — I4892 Unspecified atrial flutter: Secondary | ICD-10-CM | POA: Insufficient documentation

## 2014-07-19 DIAGNOSIS — I495 Sick sinus syndrome: Secondary | ICD-10-CM | POA: Diagnosis not present

## 2014-07-19 DIAGNOSIS — I509 Heart failure, unspecified: Secondary | ICD-10-CM | POA: Insufficient documentation

## 2014-07-19 DIAGNOSIS — I4891 Unspecified atrial fibrillation: Secondary | ICD-10-CM | POA: Insufficient documentation

## 2014-07-19 DIAGNOSIS — E1129 Type 2 diabetes mellitus with other diabetic kidney complication: Secondary | ICD-10-CM | POA: Diagnosis not present

## 2014-07-19 DIAGNOSIS — I498 Other specified cardiac arrhythmias: Secondary | ICD-10-CM | POA: Diagnosis present

## 2014-07-19 DIAGNOSIS — E785 Hyperlipidemia, unspecified: Secondary | ICD-10-CM | POA: Insufficient documentation

## 2014-07-19 DIAGNOSIS — Z7901 Long term (current) use of anticoagulants: Secondary | ICD-10-CM | POA: Insufficient documentation

## 2014-07-19 HISTORY — PX: PERMANENT PACEMAKER GENERATOR CHANGE: SHX6022

## 2014-07-19 HISTORY — PX: PACEMAKER GENERATOR CHANGE: SHX5998

## 2014-07-19 LAB — BASIC METABOLIC PANEL
Anion gap: 11 (ref 5–15)
BUN: 36 mg/dL — ABNORMAL HIGH (ref 6–23)
CO2: 30 mEq/L (ref 19–32)
CREATININE: 1.32 mg/dL — AB (ref 0.50–1.10)
Calcium: 9.3 mg/dL (ref 8.4–10.5)
Chloride: 98 mEq/L (ref 96–112)
GFR, EST AFRICAN AMERICAN: 40 mL/min — AB (ref >90)
GFR, EST NON AFRICAN AMERICAN: 35 mL/min — AB (ref >90)
Glucose, Bld: 177 mg/dL — ABNORMAL HIGH (ref 70–99)
Potassium: 4.3 mEq/L (ref 3.7–5.3)
Sodium: 139 mEq/L (ref 137–147)

## 2014-07-19 LAB — SURGICAL PCR SCREEN
MRSA, PCR: NEGATIVE
Staphylococcus aureus: NEGATIVE

## 2014-07-19 LAB — PROTIME-INR
INR: 1.7 — AB (ref 0.00–1.49)
Prothrombin Time: 20 seconds — ABNORMAL HIGH (ref 11.6–15.2)

## 2014-07-19 LAB — GLUCOSE, CAPILLARY: GLUCOSE-CAPILLARY: 184 mg/dL — AB (ref 70–99)

## 2014-07-19 SURGERY — PERMANENT PACEMAKER GENERATOR CHANGE
Anesthesia: LOCAL

## 2014-07-19 MED ORDER — LIDOCAINE HCL (PF) 1 % IJ SOLN
INTRAMUSCULAR | Status: AC
  Filled 2014-07-19: qty 60

## 2014-07-19 MED ORDER — MUPIROCIN 2 % EX OINT
TOPICAL_OINTMENT | CUTANEOUS | Status: AC
  Administered 2014-07-19: 1 via NASAL
  Filled 2014-07-19: qty 22

## 2014-07-19 MED ORDER — CHLORHEXIDINE GLUCONATE 4 % EX LIQD
60.0000 mL | Freq: Once | CUTANEOUS | Status: DC
  Filled 2014-07-19: qty 60

## 2014-07-19 MED ORDER — SODIUM CHLORIDE 0.9 % IJ SOLN: 3.0000 mL | INTRAMUSCULAR | Status: DC | PRN

## 2014-07-19 MED ORDER — MUPIROCIN 2 % EX OINT
TOPICAL_OINTMENT | Freq: Two times a day (BID) | CUTANEOUS | Status: DC
  Administered 2014-07-19: 1 via NASAL
  Filled 2014-07-19: qty 22

## 2014-07-19 MED ORDER — SODIUM CHLORIDE 0.9 % IV SOLN: 250.0000 mL | INTRAVENOUS | Status: DC | PRN

## 2014-07-19 MED ORDER — SODIUM CHLORIDE 0.9 % IV SOLN
INTRAVENOUS | Status: DC
  Administered 2014-07-19: 1000 mL via INTRAVENOUS

## 2014-07-19 MED ORDER — ONDANSETRON HCL 4 MG/2ML IJ SOLN: 4.0000 mg | Freq: Four times a day (QID) | INTRAMUSCULAR | Status: DC | PRN

## 2014-07-19 MED ORDER — SODIUM CHLORIDE 0.9 % IJ SOLN: 3.0000 mL | Freq: Two times a day (BID) | INTRAMUSCULAR | Status: DC

## 2014-07-19 MED ORDER — ACETAMINOPHEN 325 MG PO TABS
325.0000 mg | ORAL_TABLET | ORAL | Status: DC | PRN
  Filled 2014-07-19: qty 2

## 2014-07-19 NOTE — H&P (View-Only) (Signed)
PCP:  Estill Dooms, MD Primary Cardiologist:  Dr Martinique  The patient presents today for routine electrophysiology followup.  Since last being seen in our clinic, the patient reports doing very well.  She remains asymptomatic with her atrial arrhythmias.  Today, she denies symptoms of palpitations, chest pain, shortness of breath, orthopnea, PND,  dizziness, presyncope, syncope, or neurologic sequela.  Her edema is stable  The patient feels that she is tolerating medications without difficulties and is otherwise without complaint today.   Past Medical History  Diagnosis Date  . Sick sinus syndrome     post DDD pacemaker  . Hypertension   . Edema   . Type II or unspecified type diabetes mellitus with renal manifestations, not stated as uncontrolled     type 2  . Hyperlipidemia   . Atrial fibrillation     persistent afib/ atrial flutter  . Macular degeneration 2012    left   . Skin cancer of nose 2012    with skin graft  . Kidney disease, chronic, stage III (GFR 30-59 ml/min)   . Eczema   . Xerostomia 2012  . Xerophthalmia 2012  . Brain atrophy 2009  . Congestive heart failure (CHF)    Past Surgical History  Procedure Laterality Date  . Pacemaker insertion  2005  . Cardiac valve surgery  2005    Dr. Ricard Dillon  . Ankle surgery Right 1990    fracture as steel plate   . Knee surgery Right 1990    fracture has plate and rod in femur  . Colonoscopy  2008    Current Outpatient Prescriptions  Medication Sig Dispense Refill  . furosemide (LASIX) 40 MG tablet Take 40 mg by mouth daily.      Marland Kitchen glimepiride (AMARYL) 2 MG tablet Take 2 mg by mouth daily with breakfast.      . metoprolol tartrate (LOPRESSOR) 25 MG tablet Take 25 mg by mouth 2 (two) times daily.      . Multiple Vitamins-Minerals (MULTIVITAMIN PO) Take 1 tablet by mouth daily.      . rosuvastatin (CRESTOR) 10 MG tablet Take 10 mg by mouth at bedtime.      Marland Kitchen spironolactone (ALDACTONE) 25 MG tablet Take 25 mg by mouth daily.       Marland Kitchen warfarin (COUMADIN) 2.5 MG tablet Take 5 mg by mouth daily.       No current facility-administered medications for this visit.    Allergies  Allergen Reactions  . Penicillins Other (See Comments)    Temperature went up to 104 F    History   Social History  . Marital Status: Widowed    Spouse Name: Marcello Moores    Number of Children: N/A  . Years of Education: N/A   Occupational History  . retired Personal assistant    Social History Main Topics  . Smoking status: Never Smoker   . Smokeless tobacco: Never Used  . Alcohol Use: No  . Drug Use: No  . Sexual Activity: No   Other Topics Concern  . Not on file   Social History Narrative   Moved to [facility] 08/30/2012   Widowed 2013   Walks with walker   Exercise walking          Family History  Problem Relation Age of Onset  . Stroke Mother   . Cancer Brother   . Anuerysm Sister     ROS-  All systems are reviewed and are negative except as outlined in the  HPI above   Physical Exam: Filed Vitals:   07/14/14 1231  BP: 135/79  Pulse: 72  Height: 5\' 3"  (1.6 m)  Weight: 125 lb (56.7 kg)    GEN- The patient is well appearing, alert and oriented x 3 today.   Head- normocephalic, atraumatic Eyes-  Sclera clear, conjunctiva pink Ears- hearing intact Oropharynx- clear Neck- supple, Lungs- Clear to ausculation bilaterally, normal work of breathing Chest- pacemaker pocket is well healed Heart- irregular rate and rhythm, no murmurs, rubs or gallops, PMI not laterally displaced GI- soft, NT, ND, + BS Extremities- no clubbing, cyanosis, + venous stasis changes Neuro- strength and sensation are intact  Pacemaker interrogation- reviewed in detail today,  See PACEART report  Assessment and Plan:  1. Afib/ atypical flutter She has returned to atypical atria flutter but is asymptomatic Continue coumadin long term  2. Sick sinus syndrome Her pacemaker has reached ERI battery status Risks, benefits, and  alternatives to PPM generator change were discussed at length with the patient who wishes to proceed. We will schedule the procedure at the next available time. Would hold coumadin for 24 hours prior to the procedure.  3. HTN Stable No change required today

## 2014-07-19 NOTE — Op Note (Signed)
SURGEON:  Thompson Grayer, MD     PREPROCEDURE DIAGNOSES:   1. Sick sinus syndrome.   2. Atrial fibrillation/ atrial flutter.   3. Elective Replacement Indicator Status    POSTPROCEDURE DIAGNOSES:   1. Sick sinus syndrome.   2. Atrial fibrillation/ atrial flutter.   3. Elective Replacement Indicator Status    PROCEDURES:   1. Pacemaker pulse generator replacement.   2. Skin pocket revision.      INTRODUCTION:  Nancy Payne is a 80 y.o. female with a history of sick sinus syndrome. The patient has done well since his pacemaker was implanted.  The patient  has recently reached ERI battery status.  The patient presents today for pacemaker pulse generator replacement.       DESCRIPTION OF THE PROCEDURE:  Informed written consent was obtained, and the patient was brought to the electrophysiology lab in the fasting state.  The patient's pacemaker was interrogated today and found to be at elective replacement indicator battery status.  The patient required no sedation for the procedure today.  The patient's left chest was prepped and draped in the usual sterile fashion by the EP lab staff.  The skin overlying the existing pacemaker was infiltrated with lidocaine for local analgesia.  A 4-cm incision was made over the pacemaker pocket.  Using a combination of sharp and blunt dissection, the pacemaker was exposed and removed from the body.  The device was disconnected from the leads.   There was no foreign matter or debris within the pocket.  The atrial lead was confirmed to be a Medtronic model [id number] (serial number [id number]) lead implanted on 08/22/2004.  The right ventricular lead was confirmed to be a Medtronic model [id number] (serial number [id number]) lead implanted on the same date as the atrial lead (above).  Both leads were examined and their integrity was confirmed to be intact.  Atrial lead flutter-waves measured 4.1 mV with impedance of 410 ohms.  Right ventricular lead R-waves measured 5.6 mV  with impedance of 481 ohms and a threshold of 0.7 V at 0.5 msec.  Both leads were connected to a Medtronic adapt L model ADDDR 1 (serial number [id number]) pacemaker.  The pocket was revised to accommodate this new device.  Electrocautery was required to assure hemostasis.  The pocket was irrigated with copious gentamicin solution. The pacemaker was then placed into the pocket.  The pocket was then closed in 2 layers with 2-0 Vicryl suture over the subcutaneous and subcuticular layers.  Steri-Strips and a sterile dressing were then applied.  There were no early apparent complications.     CONCLUSIONS:   1. Successful pacemaker pulse generator replacement for elective replacement indicator battery status   2. No early apparent complications.     Thompson Grayer, MD 07/19/2014 8:50 AM

## 2014-07-19 NOTE — Interval H&P Note (Signed)
History and Physical Interval Note:  07/19/2014 7:22 AM  Nancy Payne  has presented today for surgery, with the diagnosis of eol  The various methods of treatment have been discussed with the patient and family. After consideration of risks, benefits and other options for treatment, the patient has consented to  Procedure(s): PERMANENT PACEMAKER GENERATOR CHANGE (N/A) as a surgical intervention .  The patient's history has been reviewed, patient examined, no change in status, stable for surgery.  I have reviewed the patient's chart and labs.  Questions were answered to the patient's satisfaction.     Thompson Grayer

## 2014-07-19 NOTE — Discharge Instructions (Signed)
Pacemaker Battery Change, Care After °Refer to this sheet in the next few weeks. These instructions provide you with information on caring for yourself after your procedure. Your health care provider may also give you more specific instructions. Your treatment has been planned according to current medical practices, but problems sometimes occur. Call your health care provider if you have any problems or questions after your procedure. °WHAT TO EXPECT AFTER THE PROCEDURE °After your procedure, it is typical to have the following sensations: °· Soreness at the pacemaker site. °HOME CARE INSTRUCTIONS  °· Keep the incision clean and dry. °· Unless advised otherwise, you may shower beginning 48 hours after your procedure. °· For the first week after the replacement, avoid stretching motions that pull at the incision site, and avoid heavy exercise with the arm that is on the same side as the incision. °· Take medicines only as directed by your health care provider. °· Keep all follow-up visits as directed by your health care provider. °SEEK MEDICAL CARE IF:  °· You have pain at the incision site that is not relieved by over-the-counter or prescription medicine. °· There is drainage or pus from the incision site. °· There is swelling larger than a lime at the incision site. °· You develop red streaking that extends above or below the incision site. °· You feel brief, intermittent palpitations, light-headedness, or any symptoms that you feel might be related to your heart. °SEEK IMMEDIATE MEDICAL CARE IF:  °· You experience chest pain that is different than the pain at the pacemaker site. °· You experience shortness of breath. °· You have palpitations or irregular heartbeat. °· You have light-headedness that does not go away quickly. °· You faint. °· You have pain that gets worse and is not relieved by medicine. °Document Released: 09/09/2013 Document Revised: 04/05/2014 Document Reviewed: 09/09/2013 °[employer]® Patient  Information ©2015 [employer]. This information is not intended to replace advice given to you by your health care provider. Make sure you discuss any questions you have with your health care provider. ° °

## 2014-07-20 ENCOUNTER — Encounter (HOSPITAL_COMMUNITY): Payer: Self-pay | Admitting: *Deleted

## 2014-07-20 DIAGNOSIS — H3530 Unspecified macular degeneration: Secondary | ICD-10-CM | POA: Diagnosis not present

## 2014-07-20 DIAGNOSIS — H35059 Retinal neovascularization, unspecified, unspecified eye: Secondary | ICD-10-CM | POA: Diagnosis not present

## 2014-07-20 DIAGNOSIS — H4310 Vitreous hemorrhage, unspecified eye: Secondary | ICD-10-CM | POA: Diagnosis not present

## 2014-07-20 DIAGNOSIS — H2510 Age-related nuclear cataract, unspecified eye: Secondary | ICD-10-CM | POA: Diagnosis not present

## 2014-07-22 DIAGNOSIS — I4891 Unspecified atrial fibrillation: Secondary | ICD-10-CM | POA: Diagnosis not present

## 2014-07-22 DIAGNOSIS — Z7901 Long term (current) use of anticoagulants: Secondary | ICD-10-CM | POA: Diagnosis not present

## 2014-07-22 LAB — PROTIME-INR

## 2014-07-29 ENCOUNTER — Telehealth: Payer: Self-pay | Admitting: *Deleted

## 2014-07-29 ENCOUNTER — Ambulatory Visit (INDEPENDENT_AMBULATORY_CARE_PROVIDER_SITE_OTHER): Payer: Medicare Other | Admitting: *Deleted

## 2014-07-29 DIAGNOSIS — I4891 Unspecified atrial fibrillation: Secondary | ICD-10-CM | POA: Diagnosis not present

## 2014-07-29 DIAGNOSIS — I495 Sick sinus syndrome: Secondary | ICD-10-CM

## 2014-07-29 DIAGNOSIS — Z7901 Long term (current) use of anticoagulants: Secondary | ICD-10-CM | POA: Diagnosis not present

## 2014-07-29 LAB — MDC_IDC_ENUM_SESS_TYPE_INCLINIC
Battery Impedance: 100 Ohm
Battery Voltage: 2.81 V
Brady Statistic AP VP Percent: 2 %
Brady Statistic AP VS Percent: 0 %
Brady Statistic AS VP Percent: 11 %
Brady Statistic AS VS Percent: 87 %
Lead Channel Impedance Value: 399 Ohm
Lead Channel Sensing Intrinsic Amplitude: 4 mV
Lead Channel Setting Pacing Amplitude: 2 V
Lead Channel Setting Pacing Amplitude: 2.5 V
Lead Channel Setting Pacing Pulse Width: 0.4 ms
MDC IDC MSMT BATTERY REMAINING LONGEVITY: 166 mo
MDC IDC MSMT LEADCHNL RV IMPEDANCE VALUE: 536 Ohm
MDC IDC MSMT LEADCHNL RV PACING THRESHOLD AMPLITUDE: 0.75 V
MDC IDC MSMT LEADCHNL RV PACING THRESHOLD PULSEWIDTH: 0.4 ms
MDC IDC MSMT LEADCHNL RV SENSING INTR AMPL: 5.6 mV
MDC IDC SESS DTM: 20150827160906
MDC IDC SET LEADCHNL RV SENSING SENSITIVITY: 2 mV

## 2014-07-29 NOTE — Telephone Encounter (Signed)
Coumadin Results from [facility] 07/29/2014  PT:  20.2 INR: 1.71  Current Dose of Coumadin is 5mg  once daily. *Patient had a procedure done, Pacemaker pulse generator Replacement on 8/17 and had to hold Coumadin 8/15 and 8/16.  Per Dr. Julieta Gutting keep Coumadin the same and recheck in 1 week.  Patient Notified and agreed and faxed to [facility].

## 2014-07-29 NOTE — Progress Notes (Signed)
Wound check appointment. Steri-strips removed. Wound without redness or edema. Incision edges approximated, wound well healed. Normal device function. Thresholds, sensing, and impedances consistent with implant measurements. Device programmed at 3.5V/auto capture programmed on for extra safety margin until 3 month visit. Histogram distribution appropriate for patient and level of activity. No high ventricular rates noted. Patient educated about wound care, arm mobility, lifting restrictions. ROV in 3 months with implanting physician. 

## 2014-08-02 ENCOUNTER — Other Ambulatory Visit: Payer: Self-pay | Admitting: Nurse Practitioner

## 2014-08-05 ENCOUNTER — Telehealth: Payer: Self-pay | Admitting: *Deleted

## 2014-08-05 DIAGNOSIS — Z7901 Long term (current) use of anticoagulants: Secondary | ICD-10-CM | POA: Diagnosis not present

## 2014-08-05 DIAGNOSIS — I4891 Unspecified atrial fibrillation: Secondary | ICD-10-CM | POA: Diagnosis not present

## 2014-08-05 LAB — POCT INR: INR: 2.5 — AB (ref 0.9–1.1)

## 2014-08-05 LAB — PROTIME-INR: PROTIME: 27.3 s — AB (ref 10.0–13.8)

## 2014-08-05 NOTE — Telephone Encounter (Signed)
Protime Results from [facility] 08/05/2014  PT:  27.3 INR:  2.51  Current Dose of Coumadin is 5mg  once daily Per Dr. Mariea Clonts Continue same dose and recheck in 1 week  Patient Notified and faxed to [facility]

## 2014-08-06 ENCOUNTER — Encounter: Payer: Self-pay | Admitting: Internal Medicine

## 2014-08-10 ENCOUNTER — Encounter: Payer: Self-pay | Admitting: Internal Medicine

## 2014-08-12 ENCOUNTER — Telehealth: Payer: Self-pay

## 2014-08-12 DIAGNOSIS — E785 Hyperlipidemia, unspecified: Secondary | ICD-10-CM | POA: Diagnosis not present

## 2014-08-12 DIAGNOSIS — I4891 Unspecified atrial fibrillation: Secondary | ICD-10-CM | POA: Diagnosis not present

## 2014-08-12 DIAGNOSIS — Z7901 Long term (current) use of anticoagulants: Secondary | ICD-10-CM | POA: Diagnosis not present

## 2014-08-12 DIAGNOSIS — E1129 Type 2 diabetes mellitus with other diabetic kidney complication: Secondary | ICD-10-CM | POA: Diagnosis not present

## 2014-08-12 DIAGNOSIS — I10 Essential (primary) hypertension: Secondary | ICD-10-CM | POA: Diagnosis not present

## 2014-08-12 LAB — HEPATIC FUNCTION PANEL
ALK PHOS: 77 U/L (ref 25–125)
ALT: 15 U/L (ref 7–35)
AST: 23 U/L (ref 13–35)
BILIRUBIN, TOTAL: 0.9 mg/dL

## 2014-08-12 LAB — LIPID PANEL
CHOLESTEROL: 91 mg/dL (ref 0–200)
HDL: 40 mg/dL (ref 35–70)
LDL Cholesterol: 35 mg/dL
LDl/HDL Ratio: 2.3
TRIGLYCERIDES: 82 mg/dL (ref 40–160)

## 2014-08-12 LAB — HEMOGLOBIN A1C: HEMOGLOBIN A1C: 8.2 % — AB (ref 4.0–6.0)

## 2014-08-12 LAB — BASIC METABOLIC PANEL
BUN: 33 mg/dL — AB (ref 4–21)
Creatinine: 1.3 mg/dL — AB (ref 0.5–1.1)
Glucose: 120 mg/dL
POTASSIUM: 4.3 mmol/L (ref 3.4–5.3)
SODIUM: 137 mmol/L (ref 137–147)

## 2014-08-12 LAB — POCT INR: INR: 2.3 — AB (ref 0.9–1.1)

## 2014-08-12 LAB — PROTIME-INR: INR: 2.3 — AB (ref 0.9–1.1)

## 2014-08-12 NOTE — Telephone Encounter (Signed)
Current dose 5 mg daily  PT/INR 25.3/2.3  Per Hassell Done, NP- continue current dose and recheck in 1 week   Patient aware of instructions and verbalized understanding. Faxed results with instructions to [phone] Attention Milus Banister

## 2014-08-17 ENCOUNTER — Non-Acute Institutional Stay: Payer: Medicare Other | Admitting: Internal Medicine

## 2014-08-17 ENCOUNTER — Encounter: Payer: Self-pay | Admitting: Internal Medicine

## 2014-08-17 VITALS — BP 122/78 | HR 68 | Wt 128.0 lb

## 2014-08-17 DIAGNOSIS — N183 Chronic kidney disease, stage 3 unspecified: Secondary | ICD-10-CM

## 2014-08-17 DIAGNOSIS — I4891 Unspecified atrial fibrillation: Secondary | ICD-10-CM

## 2014-08-17 DIAGNOSIS — R609 Edema, unspecified: Secondary | ICD-10-CM | POA: Diagnosis not present

## 2014-08-17 DIAGNOSIS — I509 Heart failure, unspecified: Secondary | ICD-10-CM

## 2014-08-17 DIAGNOSIS — E1129 Type 2 diabetes mellitus with other diabetic kidney complication: Secondary | ICD-10-CM

## 2014-08-17 DIAGNOSIS — I4819 Other persistent atrial fibrillation: Secondary | ICD-10-CM

## 2014-08-17 DIAGNOSIS — I10 Essential (primary) hypertension: Secondary | ICD-10-CM

## 2014-08-17 DIAGNOSIS — E785 Hyperlipidemia, unspecified: Secondary | ICD-10-CM

## 2014-08-17 DIAGNOSIS — Z7901 Long term (current) use of anticoagulants: Secondary | ICD-10-CM

## 2014-08-17 NOTE — Progress Notes (Signed)
Patient ID: Nancy Payne, female   DOB: [birth year], 80 y.o.   MRN: [id number]    Location:  [facility]   Place of Service: Clinic (12)    Allergies  Allergen Reactions  . Penicillins Other (See Comments)    Temperature went up to 104 F    Chief Complaint  Patient presents with  . Medical Management of Chronic Issues    blood pressure, A-Fib, blood sugar, CHF    HPI:  Persistent atrial fibrillation: controlled rate  Chronic congestive heart failure, unspecified congestive heart failure type: compensated  Edema: improved with use of compression hosiery  Hyperlipidemia: controlled  Essential hypertension: controlled  Kidney disease, chronic, stage III (GFR 30-59 ml/min): stable  Type II or unspecified type diabetes mellitus with renal manifestations, not stated as uncontrolled: A1c a little high at 8.2  Long term (current) use of anticoagulants: therapeutic INR.    Medications: Patient's Medications  New Prescriptions   No medications on file  Previous Medications   FUROSEMIDE (LASIX) 40 MG TABLET    Take 40 mg by mouth daily.   GLIMEPIRIDE (AMARYL) 2 MG TABLET    Take 2 mg by mouth daily with breakfast.   METOPROLOL TARTRATE (LOPRESSOR) 25 MG TABLET    Take 25 mg by mouth 2 (two) times daily.   MULTIPLE VITAMINS-MINERALS (MULTIVITAMIN PO)    Take 1 tablet by mouth daily.   ROSUVASTATIN (CRESTOR) 10 MG TABLET    Take 10 mg by mouth at bedtime.   SPIRONOLACTONE (ALDACTONE) 25 MG TABLET    Take 25 mg by mouth daily.   WARFARIN (COUMADIN) 2.5 MG TABLET    Take 5 mg by mouth daily.   WARFARIN (COUMADIN) 2.5 MG TABLET    TAKE AS DIRECTED BY COUMADIN CLINIC  Modified Medications   No medications on file  Discontinued Medications   No medications on file     Review of Systems  Constitutional: Positive for activity change and fatigue. Negative for fever, chills, appetite change and unexpected weight change.  HENT: Negative for congestion, ear pain, mouth  sores, postnasal drip, rhinorrhea, sinus pressure and sore throat.   Respiratory: Negative for cough, chest tightness and shortness of breath.   Cardiovascular: Negative for chest pain, palpitations and leg swelling.  Gastrointestinal: Negative for nausea, abdominal pain, diarrhea, constipation, blood in stool and abdominal distention.  Endocrine: Negative.   Genitourinary: Negative for dysuria, decreased urine volume and difficulty urinating.  Musculoskeletal: Positive for back pain and gait problem. Negative for joint swelling, myalgias, neck pain and neck stiffness.  Allergic/Immunologic: Negative.   Neurological: Negative for dizziness, tremors, seizures, syncope, facial asymmetry, speech difficulty, weakness, light-headedness and headaches.  Hematological: Negative.   Psychiatric/Behavioral: Negative for confusion, sleep disturbance, self-injury, decreased concentration and agitation. The patient is not nervous/anxious and is not hyperactive.     Filed Vitals:   08/17/14 0921  BP: 122/78  Pulse: 68  Weight: 128 lb (58.06 kg)   Body mass index is 22.68 kg/(m^2).  Physical Exam  Constitutional: She is oriented to person, place, and time. She appears well-developed. No distress.  HENT:  Head: Normocephalic and atraumatic.  Nose: Nose normal.  Eyes: Conjunctivae and EOM are normal. Pupils are equal, round, and reactive to light. Left eye exhibits no discharge. No scleral icterus.  Neck: Normal range of motion. Neck supple. No JVD present. No tracheal deviation present. No thyromegaly present.  Cardiovascular: Exam reveals no friction rub.   Murmur heard. 3/6 LSB SEM. Pacemaker in  left upper chest. AF.  Pulmonary/Chest: No respiratory distress. She has no wheezes. She has no rales.  Abdominal: Bowel sounds are normal. She exhibits no distension and no mass. There is no tenderness.  Musculoskeletal: Normal range of motion. She exhibits no edema and no tenderness.  Edematous legs  bilaterally.  Lymphadenopathy:    She has no cervical adenopathy.  Neurological: She is alert and oriented to person, place, and time. She has normal reflexes. No cranial nerve deficit. Coordination normal.  Skin: Skin is dry. No rash noted. No erythema. No pallor.  compression stockings  Psychiatric: She has a normal mood and affect. Her behavior is normal. Thought content normal.     Labs reviewed: Nursing Home on 08/17/2014  Component Date Value Ref Range Status  . INR 08/12/2014 2.3* 0.9 - 1.1 Final  . Glucose 08/12/2014 120   Final  . BUN 08/12/2014 33* 4 - 21 mg/dL Final  . Creatinine 08/12/2014 1.3* 0.5 - 1.1 mg/dL Final  . Potassium 08/12/2014 4.3  3.4 - 5.3 mmol/L Final  . Sodium 08/12/2014 137  137 - 147 mmol/L Final  . LDl/HDL Ratio 08/12/2014 2.3   Final  . Triglycerides 08/12/2014 82  40 - 160 mg/dL Final  . Cholesterol 08/12/2014 91  0 - 200 mg/dL Final  . HDL 08/12/2014 40  35 - 70 mg/dL Final  . LDL Cholesterol 08/12/2014 35   Final  . Alkaline Phosphatase 08/12/2014 77  25 - 125 U/L Final  . ALT 08/12/2014 15  7 - 35 U/L Final  . AST 08/12/2014 23  13 - 35 U/L Final  . Bilirubin, Total 08/12/2014 0.9   Final  . Hemoglobin A1C 08/12/2014 8.2* 4.0 - 6.0 % Final  Telephone on 08/05/2014  Component Date Value Ref Range Status  . INR 08/05/2014 2.5* 0.9 - 1.1 Final  . Protime 08/05/2014 27.3* 10.0 - 13.8 seconds Final  Clinical Support on 07/29/2014  Component Date Value Ref Range Status  . Date Time Interrogation Session 07/29/2014 [id number]   Final  . Pulse Generator Manufacturer 07/29/2014 Medtronic   Final  . Pulse Gen Model 07/29/2014 ADDRL1 Adapta   Final  . Pulse Gen Serial Number 07/29/2014 [id number]   Final  . RV Sense Sensitivity 07/29/2014 2.00   Final  . RA Pace Amplitude 07/29/2014 2.500   Final  . RV Pace PulseWidth 07/29/2014 0.40   Final  . RV Pace Amplitude 07/29/2014 2.000   Final  . RA Impedance 07/29/2014 399   Final  . RA Amplitude  07/29/2014 4.00   Final  . RV IMPEDANCE 07/29/2014 536   Final  . RV Amplitude 07/29/2014 5.60   Final  . RV Pacing Amplitude 07/29/2014 0.750   Final  . RV Pacing PulseWidth 07/29/2014 0.40   Final  . Battery Status 07/29/2014 Unknown   Final  . Battery Longevity 07/29/2014 166   Final  . Battery Voltage 07/29/2014 2.81   Final  . Battery Impedance 07/29/2014 100   Final  . Loletha Grayer AP VP Percent 07/29/2014 2   Final  . Loletha Grayer AS VP Percent 07/29/2014 11   Final  . Loletha Grayer AP VS Percent 07/29/2014 0   Final  . Loletha Grayer AS VS Percent 07/29/2014 87   Final  . Eval Rhythm 07/29/2014 A-fib/flutter   Final  . Miscellaneous Comment 07/29/2014    Final                   Value:Wound check appointment. Steri-strips removed. Wound  without redness or edema. Incision edges approximated, wound well healed. Normal device function. Thresholds, sensing, and impedances consistent with implant measurements. Device programmed at                          3.5V/auto capture programmed on for extra safety margin until 3 month visit. Histogram distribution appropriate for patient and level of activity. No high ventricular rates noted. Patient educated about wound care, arm mobility, lifting restrictions. ROV                          in 3 months with implanting physician.  Admission on 07/19/2014, Discharged on 07/19/2014  Component Date Value Ref Range Status  . Glucose-Capillary 07/19/2014 184* 70 - 99 mg/dL Final  . MRSA, PCR 07/19/2014 NEGATIVE  NEGATIVE Final  . Staphylococcus aureus 07/19/2014 NEGATIVE  NEGATIVE Final   Comment:                                 The Xpert SA Assay (FDA                          approved for NASAL specimens                          in patients over 32 years of age),                          is one component of                          a comprehensive surveillance                          program.  Test performance has                          been validated by [employer] for patients greater                          than or equal to 28 year old.                          It is not intended                          to diagnose infection nor to                          guide or monitor treatment.  . Sodium 07/19/2014 139  137 - 147 mEq/L Final  . Potassium 07/19/2014 4.3  3.7 - 5.3 mEq/L Final  . Chloride 07/19/2014 98  96 - 112 mEq/L Final  . CO2 07/19/2014 30  19 - 32 mEq/L Final  . Glucose, Bld 07/19/2014 177* 70 - 99 mg/dL Final  . BUN 07/19/2014 36* 6 - 23 mg/dL Final  . Creatinine, Ser 07/19/2014 1.32* 0.50 - 1.10 mg/dL  Final  . Calcium 07/19/2014 9.3  8.4 - 10.5 mg/dL Final  . GFR calc non Af Amer 07/19/2014 35* >90 mL/min Final  . GFR calc Af Amer 07/19/2014 40* >90 mL/min Final   Comment: (NOTE)                          The eGFR has been calculated using the CKD EPI equation.                          This calculation has not been validated in all clinical situations.                          eGFR's persistently <90 mL/min signify possible Chronic Kidney                          Disease.  . Anion gap 07/19/2014 11  5 - 15 Final  . Prothrombin Time 07/19/2014 20.0* 11.6 - 15.2 seconds Final  . INR 07/19/2014 1.70* 0.00 - 1.49 Final  Office Visit on 07/14/2014  Component Date Value Ref Range Status  . INR 07/14/2014 2.5* 0.8 - 1.0 ratio Final  . Prothrombin Time 07/14/2014 26.7* 9.6 - 13.1 sec Final  . aPTT 07/14/2014 42.7* 23.4 - 32.7 SEC Final  . WBC 07/14/2014 7.3  4.0 - 10.5 K/uL Final  . RBC 07/14/2014 4.34  3.87 - 5.11 Mil/uL Final  . Platelets 07/14/2014 183.0  150.0 - 400.0 K/uL Final  . Hemoglobin 07/14/2014 13.9  12.0 - 15.0 g/dL Final  . HCT 07/14/2014 40.5  36.0 - 46.0 % Final  . MCV 07/14/2014 93.4  78.0 - 100.0 fl Final  . MCHC 07/14/2014 34.2  30.0 - 36.0 g/dL Final  . RDW 07/14/2014 14.0  11.5 - 15.5 % Final  . Sodium 07/14/2014 138  135 - 145 mEq/L Final  . Potassium 07/14/2014 5.3* 3.5 - 5.1 mEq/L Final  .  Chloride 07/14/2014 99  96 - 112 mEq/L Final  . CO2 07/14/2014 32  19 - 32 mEq/L Final  . Glucose, Bld 07/14/2014 104* 70 - 99 mg/dL Final  . BUN 07/14/2014 31* 6 - 23 mg/dL Final  . Creatinine, Ser 07/14/2014 1.3* 0.4 - 1.2 mg/dL Final  . Calcium 07/14/2014 9.6  8.4 - 10.5 mg/dL Final  . GFR 07/14/2014 41.29* >60.00 mL/min Final  Clinical Support on 07/05/2014  Component Date Value Ref Range Status  . Date Time Interrogation Session 07/05/2014 [id number]   Final  . Pulse Generator Manufacturer 07/05/2014 Medtronic   Final  . Pulse Gen Model 07/05/2014 P1501DR EnRhythm   Final  . Pulse Gen Serial Number 07/05/2014 [id number]   Final  . RV Sense Sensitivity 07/05/2014 0.9   Final  . RV Pace PulseWidth 07/05/2014 0.4   Final  . RV Pace Amplitude 07/05/2014 2.5   Final  . Zone Setting Type Category 07/05/2014 VF   Final  . Zone Setting Type Category 07/05/2014 VT   Final  . Zone Setting Type Category 07/05/2014 VENTRICULAR_TACHYCARDIA_1   Final  . Zone Setting Type Category 07/05/2014 VENTRICULAR_TACHYCARDIA_2   Final  . Zone Detect Interval 07/05/2014 400   Final  . Zone Setting Type Category 07/05/2014 ATRIAL_FIBRILLATION   Final  . Zone Setting Type Category 07/05/2014 ATAF   Final  . Zone Detect Interval 07/05/2014 350   Final  . RA Impedance  07/05/2014 448   Final  . RV IMPEDANCE 07/05/2014 456   Final  . RV Amplitude 07/05/2014 5.79904   Final  . Battery Status 07/05/2014 ERI   Final  . Battery Voltage 07/05/2014 2.81   Final  . Loletha Grayer RA Perc Paced 07/05/2014 0.04   Final  . Loletha Grayer RV Perc Paced 07/05/2014 51.61   Final  . Loletha Grayer AP VP Percent 07/05/2014 0.04   Final  . Loletha Grayer AS VP Percent 07/05/2014 51.57   Final  . Loletha Grayer AP VS Percent 07/05/2014 0.01   Final  . Brady AS VS Percent 07/05/2014 48.38   Final  . Eval Rhythm 07/05/2014 A-fib   Final  . Miscellaneous Comment 07/05/2014    Final                   Value:Pacemaker remote check. Device function reviewed. Impedance,  sensing, auto capture thresholds consistent with previous measurements. Histograms appropriate for patient and level of activity. All other diagnostic data reviewed and is appropriate and                          stable for patient. Real time/magnet EGM shows appropriate sensing and capture. A-fib, + coumadin.  No ventricular high rate episodes.  Plan to follow in 3 months remotely, to see in office annually.  Device reached ERI 06/06/14 and has reverted to VVI @                          65.  Scanned Document on 06/09/2014  Component Date Value Ref Range Status  . INR 05/31/2014 1.6* 0.9 - 1.1 Final  Office Visit on 06/02/2014  Component Date Value Ref Range Status  . Date Time Interrogation Session 06/02/2014 [id number]   Final  . Pulse Generator Manufacturer 06/02/2014 Medtronic   Final  . Pulse Gen Model 06/02/2014 P1501DR EnRhythm   Final  . Pulse Gen Serial Number 06/02/2014 [id number]   Final  . RV Sense Sensitivity 06/02/2014 0.9   Final  . RA Pace Amplitude 06/02/2014 2   Final  . RV Pace PulseWidth 06/02/2014 0.4   Final  . RV Pace Amplitude 06/02/2014 2.5   Final  . Zone Setting Type Category 06/02/2014 VF   Final  . Zone Setting Type Category 06/02/2014 VT   Final  . Zone Setting Type Category 06/02/2014 VENTRICULAR_TACHYCARDIA_1   Final  . Zone Setting Type Category 06/02/2014 VENTRICULAR_TACHYCARDIA_2   Final  . Zone Detect Interval 06/02/2014 400   Final  . Zone Setting Type Category 06/02/2014 ATRIAL_FIBRILLATION   Final  . Zone Setting Type Category 06/02/2014 ATAF   Final  . Zone Detect Interval 06/02/2014 350   Final  . RA Impedance 06/02/2014 400   Final  . RA Amplitude 06/02/2014 2.93621   Final  . RV IMPEDANCE 06/02/2014 464   Final  . RV Amplitude 06/02/2014 6.14016   Final  . RV Pacing Amplitude 06/02/2014 1.0   Final  . RV Pacing PulseWidth 06/02/2014 0.4   Final  . Battery Status 06/02/2014 OK   Final  . Battery Voltage 06/02/2014 2.82   Final  . Loletha Grayer RA  Perc Paced 06/02/2014 28.66   Final  . Loletha Grayer RV Perc Paced 06/02/2014 41.14   Final  . Loletha Grayer AP VP Percent 06/02/2014 27.94   Final  . Loletha Grayer AS VP Percent 06/02/2014 13.19   Final  . Brady AP VS Percent 06/02/2014 0.71  Final  Loletha Grayer AS VS Percent 06/02/2014 58.15   Final  . Eval Rhythm 06/02/2014 AFL/VS   Final  . Miscellaneous Comment 06/02/2014    Final                   Value:Pacemaker check in clinic. Normal device function. Thresholds, sensing, impedances consistent with previous measurements. Device programmed to maximize longevity. 1 AFL episode since 04-2014 (66%) pt w/o sxms + Warfarin---recently sub-therapeutic. No high                          ventricular rates noted. Device programmed at appropriate safety margins. Histogram distribution appropriate for patient activity level. Device programmed to optimize intrinsic conduction. Batt voltage 2.82 (ERI 2.81V). Patient will follow up via                          [facility] on 07-05-2014 (batt check) and with JA in 12 months.    Assessment/Plan  1. Persistent atrial fibrillation Rate controlled  2. Chronic congestive heart failure, unspecified congestive heart failure type compensated  3. Edema Continue compression stockings  4. Hyperlipidemia controlled  5. Essential hypertension controlled  6. Kidney disease, chronic, stage III (GFR 30-59 ml/min) stable  7. Type II or unspecified type diabetes mellitus with renal manifestations, not stated as uncontrolled Continue to take current dose of 2 mg glimiperide. May need increase in the future if A1c does not come down.  8. Long term (current) use of anticoagulants Therapeutic INR

## 2014-08-19 ENCOUNTER — Telehealth: Payer: Self-pay | Admitting: *Deleted

## 2014-08-19 DIAGNOSIS — I4891 Unspecified atrial fibrillation: Secondary | ICD-10-CM | POA: Diagnosis not present

## 2014-08-19 DIAGNOSIS — Z7901 Long term (current) use of anticoagulants: Secondary | ICD-10-CM | POA: Diagnosis not present

## 2014-08-19 LAB — POCT INR: INR: 2.3 — AB (ref 0.9–1.1)

## 2014-08-19 LAB — PROTIME-INR
INR: 2.3 — AB (ref 0.9–1.1)
Protime: 25.4 seconds — AB (ref 10.0–13.8)

## 2014-08-19 NOTE — Telephone Encounter (Signed)
Protime Results from [facility] 08/19/2014  INR:  2.31 PT:  25.4  Current Dose of Coumadin 5mg  once daily Per Jessica Karam---Continue same dose of Coumadin and recheck in 2 weeks. Patient Notified and agreed.

## 2014-08-20 DIAGNOSIS — L608 Other nail disorders: Secondary | ICD-10-CM | POA: Diagnosis not present

## 2014-08-27 ENCOUNTER — Encounter: Payer: Self-pay | Admitting: Internal Medicine

## 2014-09-02 ENCOUNTER — Telehealth: Payer: Self-pay | Admitting: *Deleted

## 2014-09-02 DIAGNOSIS — I4891 Unspecified atrial fibrillation: Secondary | ICD-10-CM | POA: Diagnosis not present

## 2014-09-02 DIAGNOSIS — Z7901 Long term (current) use of anticoagulants: Secondary | ICD-10-CM | POA: Diagnosis not present

## 2014-09-02 LAB — PROTIME-INR: PROTIME: 25.1 s — AB (ref 10.0–13.8)

## 2014-09-02 LAB — POCT INR: INR: 2.3 — AB (ref 0.9–1.1)

## 2014-09-02 NOTE — Telephone Encounter (Signed)
Protime Results from [facility] 09/02/2014  PT:  25.1 INR: 2.26  Current Dose of Coumadin: 5mg  daily Per Sherrie Mustache, NP--Continue 5mg  daily and recheck in 1 month  Patient Notified, Abstracted and faxed to [facility]

## 2014-09-08 DIAGNOSIS — H2513 Age-related nuclear cataract, bilateral: Secondary | ICD-10-CM | POA: Diagnosis not present

## 2014-09-08 DIAGNOSIS — H3530 Unspecified macular degeneration: Secondary | ICD-10-CM | POA: Diagnosis not present

## 2014-09-17 ENCOUNTER — Other Ambulatory Visit: Payer: Self-pay | Admitting: *Deleted

## 2014-09-17 MED ORDER — WARFARIN SODIUM 2.5 MG PO TABS: ORAL_TABLET | ORAL | Status: DC

## 2014-09-17 NOTE — Telephone Encounter (Signed)
[facility] [employer] 

## 2014-09-18 DIAGNOSIS — Z23 Encounter for immunization: Secondary | ICD-10-CM | POA: Diagnosis not present

## 2014-09-26 ENCOUNTER — Other Ambulatory Visit: Payer: Self-pay | Admitting: Internal Medicine

## 2014-09-30 ENCOUNTER — Telehealth: Payer: Self-pay | Admitting: *Deleted

## 2014-09-30 DIAGNOSIS — Z7901 Long term (current) use of anticoagulants: Secondary | ICD-10-CM | POA: Diagnosis not present

## 2014-09-30 DIAGNOSIS — I4891 Unspecified atrial fibrillation: Secondary | ICD-10-CM | POA: Diagnosis not present

## 2014-09-30 LAB — PROTIME-INR
INR: 2.6 — AB (ref 0.9–1.1)
Protime: 28 seconds — AB (ref 10.0–13.8)

## 2014-09-30 LAB — POCT INR: INR: 2.6 — AB (ref 0.9–1.1)

## 2014-09-30 NOTE — Telephone Encounter (Signed)
Protime Results from [facility] 09/30/2014  PT:  28.0 INR:  2.60  Current Dose of Coumadin is 5mg  daily Per Dr. Reed---Continue Coumadin 5mg  Daily and recheck INR in 1 week Patient Notified and faxed to [facility] at [facility]

## 2014-10-07 DIAGNOSIS — I4891 Unspecified atrial fibrillation: Secondary | ICD-10-CM | POA: Diagnosis not present

## 2014-10-07 DIAGNOSIS — Z7901 Long term (current) use of anticoagulants: Secondary | ICD-10-CM | POA: Diagnosis not present

## 2014-10-07 LAB — POCT INR: INR: 2.2 — AB (ref 0.9–1.1)

## 2014-10-07 LAB — PROTIME-INR: Protime: 24.6 seconds — AB (ref 10.0–13.8)

## 2014-10-08 ENCOUNTER — Telehealth: Payer: Self-pay | Admitting: *Deleted

## 2014-10-08 NOTE — Telephone Encounter (Signed)
Protime Results 10/07/2014 from [facility]  PT: 24.6 INR: 2.20  Current Dose of Coumadin is 5mg  once daily Per Dr. Reed---Continue 5mg  daily and recheck in 1 week Patient Notified and faxed to [facility].

## 2014-10-13 ENCOUNTER — Other Ambulatory Visit: Payer: Self-pay | Admitting: Internal Medicine

## 2014-10-14 DIAGNOSIS — I4891 Unspecified atrial fibrillation: Secondary | ICD-10-CM | POA: Diagnosis not present

## 2014-10-14 DIAGNOSIS — Z7901 Long term (current) use of anticoagulants: Secondary | ICD-10-CM | POA: Diagnosis not present

## 2014-10-14 LAB — PROTIME-INR: Protime: 26.2 seconds — AB (ref 10.0–13.8)

## 2014-10-14 LAB — POCT INR: INR: 2.4 — AB (ref 0.9–1.1)

## 2014-10-15 ENCOUNTER — Telehealth: Payer: Self-pay | Admitting: *Deleted

## 2014-10-15 NOTE — Telephone Encounter (Signed)
Patient called wanting Protime Results. I haven't received them yet. I called [facility] and had to Leave message with DON Amber requesting the Results because Milus Banister if out until Monday.

## 2014-10-18 NOTE — Telephone Encounter (Signed)
Received Protime results from [facility] 10/18/2014.  PT:  26.2 INR:  2.38  Current Dose of Coumadin is 5mg  daily Per Dr. Ivy Lynn same dose of Coumadin and recheck in 1 month Patient notified and faxed to [facility].

## 2014-11-04 DIAGNOSIS — I4891 Unspecified atrial fibrillation: Secondary | ICD-10-CM | POA: Diagnosis not present

## 2014-11-04 DIAGNOSIS — E785 Hyperlipidemia, unspecified: Secondary | ICD-10-CM | POA: Diagnosis not present

## 2014-11-04 DIAGNOSIS — Z950 Presence of cardiac pacemaker: Secondary | ICD-10-CM | POA: Insufficient documentation

## 2014-11-04 DIAGNOSIS — Z9889 Other specified postprocedural states: Secondary | ICD-10-CM

## 2014-11-04 DIAGNOSIS — N183 Chronic kidney disease, stage 3 (moderate): Secondary | ICD-10-CM | POA: Diagnosis not present

## 2014-11-04 DIAGNOSIS — I10 Essential (primary) hypertension: Secondary | ICD-10-CM | POA: Diagnosis not present

## 2014-11-04 DIAGNOSIS — E118 Type 2 diabetes mellitus with unspecified complications: Secondary | ICD-10-CM | POA: Diagnosis not present

## 2014-11-04 DIAGNOSIS — Z8679 Personal history of other diseases of the circulatory system: Secondary | ICD-10-CM | POA: Insufficient documentation

## 2014-11-04 DIAGNOSIS — H2513 Age-related nuclear cataract, bilateral: Secondary | ICD-10-CM | POA: Diagnosis not present

## 2014-11-05 ENCOUNTER — Encounter: Payer: Self-pay | Admitting: Internal Medicine

## 2014-11-05 ENCOUNTER — Ambulatory Visit (INDEPENDENT_AMBULATORY_CARE_PROVIDER_SITE_OTHER): Payer: Medicare Other | Admitting: Internal Medicine

## 2014-11-05 VITALS — BP 122/70 | HR 90 | Ht 63.0 in | Wt 127.0 lb

## 2014-11-05 DIAGNOSIS — I495 Sick sinus syndrome: Secondary | ICD-10-CM

## 2014-11-05 DIAGNOSIS — I482 Chronic atrial fibrillation, unspecified: Secondary | ICD-10-CM

## 2014-11-05 DIAGNOSIS — I10 Essential (primary) hypertension: Secondary | ICD-10-CM | POA: Diagnosis not present

## 2014-11-05 LAB — MDC_IDC_ENUM_SESS_TYPE_INCLINIC
Battery Impedance: 100 Ohm
Battery Voltage: 2.8 V
Brady Statistic AP VP Percent: 5 %
Brady Statistic AP VS Percent: 0 %
Brady Statistic AS VP Percent: 27 %
Lead Channel Impedance Value: 404 Ohm
Lead Channel Pacing Threshold Amplitude: 0.75 V
Lead Channel Pacing Threshold Pulse Width: 0.4 ms
Lead Channel Sensing Intrinsic Amplitude: 4 mV
Lead Channel Setting Pacing Amplitude: 2.5 V
Lead Channel Setting Pacing Amplitude: 2.5 V
Lead Channel Setting Pacing Pulse Width: 0.4 ms
MDC IDC MSMT BATTERY REMAINING LONGEVITY: 133 mo
MDC IDC MSMT LEADCHNL RV IMPEDANCE VALUE: 569 Ohm
MDC IDC MSMT LEADCHNL RV SENSING INTR AMPL: 5.6 mV
MDC IDC SESS DTM: 20151204175149
MDC IDC SET LEADCHNL RV SENSING SENSITIVITY: 2 mV
MDC IDC STAT BRADY AS VS PERCENT: 68 %

## 2014-11-05 NOTE — Patient Instructions (Signed)
Your physician wants you to follow-up in: 12 months with Dr. Vallery Ridge will receive a reminder letter in the mail two months in advance. If you don't receive a letter, please call our office to schedule the follow-up appointment.  Remote monitoring is used to monitor your Pacemaker of ICD from home. This monitoring reduces the number of office visits required to check your device to one time per year. It allows [place] to keep an eye on the functioning of your device to ensure it is working properly. You are scheduled for a device check from home on 02/07/15. You may send your transmission at any time that day. If you have a wireless device, the transmission will be sent automatically. After your physician reviews your transmission, you will receive a postcard with your next transmission date.

## 2014-11-07 ENCOUNTER — Encounter: Payer: Self-pay | Admitting: Internal Medicine

## 2014-11-07 NOTE — Progress Notes (Signed)
PCP:  Estill Dooms, MD Primary Cardiologist:  Dr Martinique  The patient presents today for routine electrophysiology followup.  Since last being seen in our clinic, the patient reports doing very well.  She remains asymptomatic with her atrial arrhythmias.  She went to [place] several weeks ago with a group of seniors and was active all day each day site seeing!  She walked all over [place].  Today, she denies symptoms of palpitations, chest pain, shortness of breath, orthopnea, PND,  dizziness, presyncope, syncope, or neurologic sequela.  Her edema is stable  The patient feels that she is tolerating medications without difficulties and is otherwise without complaint today.   Past Medical History  Diagnosis Date  . Sick sinus syndrome     post DDD pacemaker  . Hypertension   . Edema   . Type II or unspecified type diabetes mellitus with renal manifestations, not stated as uncontrolled     type 2  . Hyperlipidemia   . Atrial fibrillation     persistent afib/ atrial flutter  . Macular degeneration 2012    left   . Skin cancer of nose 2012    with skin graft  . Kidney disease, chronic, stage III (GFR 30-59 ml/min)   . Eczema   . Xerostomia 2012  . Xerophthalmia 2012  . Brain atrophy 2009  . Congestive heart failure (CHF)    Past Surgical History  Procedure Laterality Date  . Pacemaker insertion  2005; 07-19-14    MDT ADDRL1 pacemaker generator change by Dr Rayann Heman [phone]  . Cardiac valve surgery  2005    Dr. Ricard Dillon  . Ankle surgery Right 1990    fracture as steel plate   . Knee surgery Right 1990    fracture has plate and rod in femur  . Colonoscopy  2008  . Pacemaker generator change  07/19/2014    Dr. Rayann Heman MDT    Current Outpatient Prescriptions  Medication Sig Dispense Refill  . furosemide (LASIX) 40 MG tablet Take 40 mg by mouth daily.    Marland Kitchen glimepiride (AMARYL) 2 MG tablet Take 2 mg by mouth daily with breakfast.    . metoprolol tartrate (LOPRESSOR) 25 MG tablet Take 25 mg  by mouth 2 (two) times daily.    . Multiple Vitamins-Minerals (MULTIVITAMIN PO) Take 1 tablet by mouth daily.    . rosuvastatin (CRESTOR) 10 MG tablet Take 10 mg by mouth at bedtime.    Marland Kitchen spironolactone (ALDACTONE) 25 MG tablet Take 25 mg by mouth daily.    Marland Kitchen warfarin (COUMADIN) 5 MG tablet Take as directed by the coumadin clinic     No current facility-administered medications for this visit.    Allergies  Allergen Reactions  . Penicillins Other (See Comments)    Temperature went up to 104 F  . Penicillin G Benzathine & Proc Other (See Comments)    Unknown    History   Social History  . Marital Status: Widowed    Spouse Name: Marcello Moores    Number of Children: N/A  . Years of Education: N/A   Occupational History  . retired Personal assistant    Social History Main Topics  . Smoking status: Never Smoker   . Smokeless tobacco: Never Used  . Alcohol Use: No  . Drug Use: No  . Sexual Activity: No   Other Topics Concern  . Not on file   Social History Narrative   Moved to [facility] 08/30/2012   Widowed 2013   Walks  with walker   Exercise walking          Family History  Problem Relation Age of Onset  . Stroke Mother   . Cancer Brother   . Anuerysm Sister     ROS-  All systems are reviewed and are negative except as outlined in the HPI above   Physical Exam: Filed Vitals:   11/05/14 1251  BP: 122/70  Pulse: 90  Height: 5\' 3"  (1.6 m)  Weight: 127 lb (57.607 kg)    GEN- The patient is well appearing, alert and oriented x 3 today.   Head- normocephalic, atraumatic Eyes-  Sclera clear, conjunctiva pink Ears- hearing intact Oropharynx- clear Neck- supple, Lungs- Clear to ausculation bilaterally, normal work of breathing Chest- pacemaker pocket is well healed Heart- irregular rate and rhythm, no murmurs, rubs or gallops, PMI not laterally displaced GI- soft, NT, ND, + BS Extremities- no clubbing, cyanosis, + venous stasis changes Neuro- strength and  sensation are intact  Pacemaker interrogation- reviewed in detail today,  See PACEART report  Assessment and Plan:  1. Afib/ atypical flutter She has returned to atypical atria flutter but is asymptomatic Continue coumadin long term  2. Sick sinus syndrome Doing well s/p gen change Normal pacemaker function See Pace Art report No changes today  3. HTN Stable No change required today   carelink Return to see me or my NP in 1 year

## 2014-11-08 ENCOUNTER — Telehealth: Payer: Self-pay | Admitting: *Deleted

## 2014-11-08 DIAGNOSIS — E1129 Type 2 diabetes mellitus with other diabetic kidney complication: Secondary | ICD-10-CM | POA: Diagnosis not present

## 2014-11-08 DIAGNOSIS — Z7901 Long term (current) use of anticoagulants: Secondary | ICD-10-CM | POA: Diagnosis not present

## 2014-11-08 DIAGNOSIS — I4891 Unspecified atrial fibrillation: Secondary | ICD-10-CM | POA: Diagnosis not present

## 2014-11-08 LAB — HEMOGLOBIN A1C: Hgb A1c MFr Bld: 7.8 % — AB (ref 4.0–6.0)

## 2014-11-08 LAB — BASIC METABOLIC PANEL
BUN: 27 mg/dL — AB (ref 4–21)
CREATININE: 1.2 mg/dL — AB (ref 0.5–1.1)
Glucose: 135 mg/dL
POTASSIUM: 4.4 mmol/L (ref 3.4–5.3)
SODIUM: 134 mmol/L — AB (ref 137–147)

## 2014-11-08 LAB — POCT INR: INR: 2.6 — AB (ref 0.9–1.1)

## 2014-11-08 NOTE — Telephone Encounter (Signed)
Protime Results from [facility] 11/08/2014  PT:  27.6 INR:  2.57  Current Dose of Coumadin is 5mg  daily Per Levonne Spiller current 5mg  daily and recheck INR in 1 month Patient Notified and faxed to [facility] at [facility]

## 2014-11-11 ENCOUNTER — Encounter (HOSPITAL_COMMUNITY): Payer: Self-pay | Admitting: Internal Medicine

## 2014-11-11 DIAGNOSIS — I129 Hypertensive chronic kidney disease with stage 1 through stage 4 chronic kidney disease, or unspecified chronic kidney disease: Secondary | ICD-10-CM | POA: Diagnosis not present

## 2014-11-11 DIAGNOSIS — H3530 Unspecified macular degeneration: Secondary | ICD-10-CM | POA: Diagnosis not present

## 2014-11-11 DIAGNOSIS — H2512 Age-related nuclear cataract, left eye: Secondary | ICD-10-CM | POA: Diagnosis not present

## 2014-11-11 DIAGNOSIS — N183 Chronic kidney disease, stage 3 (moderate): Secondary | ICD-10-CM | POA: Diagnosis not present

## 2014-11-11 DIAGNOSIS — Z880 Allergy status to penicillin: Secondary | ICD-10-CM | POA: Diagnosis not present

## 2014-11-11 DIAGNOSIS — I4891 Unspecified atrial fibrillation: Secondary | ICD-10-CM | POA: Diagnosis not present

## 2014-11-11 DIAGNOSIS — Z7901 Long term (current) use of anticoagulants: Secondary | ICD-10-CM | POA: Diagnosis not present

## 2014-11-11 DIAGNOSIS — I495 Sick sinus syndrome: Secondary | ICD-10-CM | POA: Diagnosis not present

## 2014-11-11 DIAGNOSIS — E785 Hyperlipidemia, unspecified: Secondary | ICD-10-CM | POA: Diagnosis not present

## 2014-11-11 DIAGNOSIS — H25812 Combined forms of age-related cataract, left eye: Secondary | ICD-10-CM | POA: Diagnosis not present

## 2014-11-11 DIAGNOSIS — Z950 Presence of cardiac pacemaker: Secondary | ICD-10-CM | POA: Diagnosis not present

## 2014-11-11 HISTORY — PX: CATARACT EXTRACTION W/ INTRAOCULAR LENS IMPLANT: SHX1309

## 2014-11-12 DIAGNOSIS — Z9842 Cataract extraction status, left eye: Secondary | ICD-10-CM | POA: Diagnosis not present

## 2014-11-12 DIAGNOSIS — Z48810 Encounter for surgical aftercare following surgery on the sense organs: Secondary | ICD-10-CM | POA: Diagnosis not present

## 2014-11-12 DIAGNOSIS — Z961 Presence of intraocular lens: Secondary | ICD-10-CM | POA: Insufficient documentation

## 2014-11-15 ENCOUNTER — Other Ambulatory Visit: Payer: Self-pay | Admitting: Internal Medicine

## 2014-11-15 ENCOUNTER — Other Ambulatory Visit: Payer: Self-pay

## 2014-11-16 ENCOUNTER — Encounter: Payer: Self-pay | Admitting: Internal Medicine

## 2014-11-16 ENCOUNTER — Non-Acute Institutional Stay: Payer: Medicare Other | Admitting: Internal Medicine

## 2014-11-16 VITALS — BP 120/72 | HR 68 | Temp 97.8°F | Wt 127.0 lb

## 2014-11-16 DIAGNOSIS — I482 Chronic atrial fibrillation, unspecified: Secondary | ICD-10-CM

## 2014-11-16 DIAGNOSIS — N183 Chronic kidney disease, stage 3 unspecified: Secondary | ICD-10-CM

## 2014-11-16 DIAGNOSIS — I509 Heart failure, unspecified: Secondary | ICD-10-CM

## 2014-11-16 DIAGNOSIS — E1129 Type 2 diabetes mellitus with other diabetic kidney complication: Secondary | ICD-10-CM | POA: Diagnosis not present

## 2014-11-16 DIAGNOSIS — E1165 Type 2 diabetes mellitus with hyperglycemia: Secondary | ICD-10-CM | POA: Diagnosis not present

## 2014-11-16 DIAGNOSIS — R609 Edema, unspecified: Secondary | ICD-10-CM

## 2014-11-16 DIAGNOSIS — Z7901 Long term (current) use of anticoagulants: Secondary | ICD-10-CM

## 2014-11-16 DIAGNOSIS — IMO0002 Reserved for concepts with insufficient information to code with codable children: Secondary | ICD-10-CM

## 2014-11-16 NOTE — Progress Notes (Signed)
Patient ID: Nancy Payne, female   DOB: 10-Mar-1922, 80 y.o.   MRN: [id number]    [facility]     Place of Service: Clinic (12)    Allergies  Allergen Reactions  . Penicillins Other (See Comments)    Temperature went up to 104 F  . Penicillin G Benzathine & Proc Other (See Comments)    Unknown    Chief Complaint  Patient presents with  . Medical Management of Chronic Issues    blood pressure, A-Fib, CHF, blood sugar, edema    HPI:  Had cataract removal at [facility] by Dr. Dyke Maes.  Had battery replacement for heer pacemaker in Sept 2015.  Feeling well.   Chronic congestive heart failure, unspecified congestive heart failure type: compensated  Chronic atrial fibrillation:; rate comtrolled  Edema: wearing compression hose  Kidney disease, chronic, stage III (GFR 30-59 ml/min): systable  Long term current use of anticoagulant therapy: therapeutic  DM type 2, uncontrolled, with renal complications: V4Q 7.8.      Medications: Patient's Medications  New Prescriptions   No medications on file  Previous Medications   FUROSEMIDE (LASIX) 40 MG TABLET    Take 40 mg by mouth daily.   GLIMEPIRIDE (AMARYL) 2 MG TABLET    Take 2 mg by mouth daily with breakfast.   METOPROLOL TARTRATE (LOPRESSOR) 25 MG TABLET    Take 25 mg by mouth 2 (two) times daily.   MOXIFLOXACIN (VIGAMOX) 0.5 % OPHTHALMIC SOLUTION    1 drop. Place one drop into the left eye 4 times daily   MULTIPLE VITAMINS-MINERALS (MULTIVITAMIN PO)    Take 1 tablet by mouth daily.   PREDNISOLONE ACETATE (PRED FORTE) 1 % OPHTHALMIC SUSPENSION    1 drop. One drop in left eye 4 times daily   ROSUVASTATIN (CRESTOR) 10 MG TABLET    Take 10 mg by mouth at bedtime.   SPIRONOLACTONE (ALDACTONE) 25 MG TABLET    Take 25 mg by mouth daily.   WARFARIN (COUMADIN) 2.5 MG TABLET    TAKE AS DIRECTED BY COUMADIN CLINIC   WARFARIN (COUMADIN) 5 MG TABLET    Take as directed by the coumadin clinic  Modified  Medications   No medications on file  Discontinued Medications   No medications on file     Review of Systems  Constitutional: Positive for activity change and fatigue. Negative for fever, chills, appetite change and unexpected weight change.  HENT: Negative for congestion, ear pain, mouth sores, postnasal drip, rhinorrhea, sinus pressure and sore throat.   Respiratory: Negative for cough, chest tightness and shortness of breath.   Cardiovascular: Negative for chest pain, palpitations and leg swelling.  Gastrointestinal: Negative for nausea, abdominal pain, diarrhea, constipation, blood in stool and abdominal distention.  Endocrine: Negative.   Genitourinary: Negative for dysuria, decreased urine volume and difficulty urinating.  Musculoskeletal: Positive for back pain and gait problem. Negative for myalgias, joint swelling, neck pain and neck stiffness.  Allergic/Immunologic: Negative.   Neurological: Negative for dizziness, tremors, seizures, syncope, facial asymmetry, speech difficulty, weakness, light-headedness and headaches.  Hematological: Negative.   Psychiatric/Behavioral: Negative for confusion, sleep disturbance, self-injury, decreased concentration and agitation. The patient is not nervous/anxious and is not hyperactive.     Filed Vitals:   11/16/14 0924  BP: 120/72  Pulse: 68  Temp: 97.8 F (36.6 C)  TempSrc: Oral  Weight: 127 lb (57.607 kg)   Body mass index is 22.5 kg/(m^2).  Physical Exam  Constitutional: She is oriented to person,  place, and time. She appears well-developed. No distress.  HENT:  Head: Normocephalic and atraumatic.  Nose: Nose normal.  Eyes: Conjunctivae and EOM are normal. Pupils are equal, round, and reactive to light. Left eye exhibits no discharge. No scleral icterus.  Neck: Normal range of motion. Neck supple. No JVD present. No tracheal deviation present. No thyromegaly present.  Cardiovascular: Exam reveals no friction rub.   Murmur  heard. 3/6 LSB SEM. Pacemaker in left upper chest. AF.  Pulmonary/Chest: No respiratory distress. She has no wheezes. She has no rales.  Abdominal: Bowel sounds are normal. She exhibits no distension and no mass. There is no tenderness.  Musculoskeletal: Normal range of motion. She exhibits no edema or tenderness.  Edematous legs bilaterally.  Lymphadenopathy:    She has no cervical adenopathy.  Neurological: She is alert and oriented to person, place, and time. She has normal reflexes. No cranial nerve deficit. Coordination normal.  Skin: Skin is dry. No rash noted. No erythema. No pallor.  compression stockings  Psychiatric: She has a normal mood and affect. Her behavior is normal. Thought content normal.     Labs reviewed: Lab on 11/15/2014  Component Date Value Ref Range Status  . INR 11/08/2014 2.6* 0.9 - 1.1 Final  . Glucose 11/08/2014 135   Final  . BUN 11/08/2014 27* 4 - 21 mg/dL Final  . Creatinine 11/08/2014 1.2* 0.5 - 1.1 mg/dL Final  . Potassium 11/08/2014 4.4  3.4 - 5.3 mmol/L Final  . Sodium 11/08/2014 134* 137 - 147 mmol/L Final  . Hgb A1c MFr Bld 11/08/2014 7.8* 4.0 - 6.0 % Final  Office Visit on 11/05/2014  Component Date Value Ref Range Status  . Date Time Interrogation Session 11/05/2014 [id number]   Final  . Pulse Generator Manufacturer 11/05/2014 Medtronic   Final  . Pulse Gen Model 11/05/2014 ADDRL1 Adapta   Final  . Pulse Gen Serial Number 11/05/2014 [id number]   Final  . RV Sense Sensitivity 11/05/2014 2.00   Final  . RA Pace Amplitude 11/05/2014 2.500   Final  . RV Pace PulseWidth 11/05/2014 0.40   Final  . RV Pace Amplitude 11/05/2014 2.500   Final  . RA Impedance 11/05/2014 404   Final  . RA Amplitude 11/05/2014 4.00   Final  . RV IMPEDANCE 11/05/2014 569   Final  . RV Amplitude 11/05/2014 5.60   Final  . RV Pacing Amplitude 11/05/2014 0.750   Final  . RV Pacing PulseWidth 11/05/2014 0.40   Final  . Battery Status 11/05/2014 Unknown   Final  .  Battery Longevity 11/05/2014 133   Final  . Battery Voltage 11/05/2014 2.80   Final  . Battery Impedance 11/05/2014 100   Final  . Brady AP VP Percent 11/05/2014 5   Final  . Loletha Grayer AS VP Percent 11/05/2014 27   Final  . Loletha Grayer AP VS Percent 11/05/2014 0   Final  . Loletha Grayer AS VS Percent 11/05/2014 68   Final  . Eval Rhythm 11/05/2014 AF w/Vs @ 31   Final  . Miscellaneous Comment 11/05/2014    Final                   Value:Pacemaker check in clinic. Normal device function. Thresholds, sensing, impedances consistent with previous measurements. Device programmed to maximize longevity. Pt in AF 100% of time. + Warfarin. No high ventricular rates noted. Device programmed at  appropriate safety margins. Histogram distribution appropriate for patient activity level. Device programmed to optimize intrinsic conduction.  Estimated longevity 11 years. Patient enrolled in remote follow-up. Carelink 02-07-15 and ROV in 12 mths w/JA.   Telephone on 10/15/2014  Component Date Value Ref Range Status  . INR 10/14/2014 2.4* 0.9 - 1.1 Final  . Protime 10/14/2014 26.2* 10.0 - 13.8 seconds Final  Telephone on 10/08/2014  Component Date Value Ref Range Status  . INR 10/07/2014 2.2* 0.9 - 1.1 Final  . Protime 10/07/2014 24.6* 10.0 - 13.8 seconds Final  Telephone on 09/30/2014  Component Date Value Ref Range Status  . INR 09/30/2014 2.6* 0.9 - 1.1 Final  . INR 09/30/2014 2.60* 0.9 - 1.1 Corrected  . Protime 09/30/2014 28.0* 10.0 - 13.8 seconds Final  Telephone on 09/02/2014  Component Date Value Ref Range Status  . INR 09/02/2014 2.3* 0.9 - 1.1 Final  . Protime 09/02/2014 25.1* 10.0 - 13.8 seconds Final  Scanned Document on 08/27/2014  Component Date Value Ref Range Status  . INR 08/19/2014 2.3* 0.9 - 1.1 Final  Scanned Document on 08/27/2014  Component Date Value Ref Range Status  . INR 08/12/2014 2.3* 0.9 - 1.1 Final  Telephone on 08/19/2014  Component Date Value Ref Range Status  . INR 08/19/2014 2.3* 0.9 -  1.1 Final  . Protime 08/19/2014 25.4* 10.0 - 13.8 seconds Final     Assessment/Plan 1. Chronic congestive heart failure, unspecified congestive heart failure type Compensated  2. Chronic atrial fibrillation Rate controlled  3. Edema Wears compression stockings. Edema is improved overall.  4. Kidney disease, chronic, stage III (GFR 30-59 ml/min) Stable  5. Long term current use of anticoagulant therapy Last INR was therapeutic  6. DM type 2, uncontrolled, with renal complications Patient has a slightly high A1c. She feels fine. There is no complaint of thirst, polyphagia, polyuria.

## 2014-11-19 DIAGNOSIS — L602 Onychogryphosis: Secondary | ICD-10-CM | POA: Diagnosis not present

## 2014-11-19 DIAGNOSIS — E1151 Type 2 diabetes mellitus with diabetic peripheral angiopathy without gangrene: Secondary | ICD-10-CM | POA: Diagnosis not present

## 2014-11-30 ENCOUNTER — Encounter: Payer: Self-pay | Admitting: Internal Medicine

## 2014-12-06 ENCOUNTER — Telehealth: Payer: Self-pay | Admitting: *Deleted

## 2014-12-06 DIAGNOSIS — I4891 Unspecified atrial fibrillation: Secondary | ICD-10-CM | POA: Diagnosis not present

## 2014-12-06 DIAGNOSIS — Z7901 Long term (current) use of anticoagulants: Secondary | ICD-10-CM | POA: Diagnosis not present

## 2014-12-06 LAB — PROTIME-INR: Protime: 28.5 seconds — AB (ref 10.0–13.8)

## 2014-12-06 LAB — POCT INR: INR: 2.6 — AB (ref 0.9–1.1)

## 2014-12-06 NOTE — Telephone Encounter (Signed)
Protime Results 12/06/2014  INR: 2.65 PT: 28.5  Current Dose of Coumadin: 5mg  once daily Per Jessica Eubanks---Continue 5mg  daily and recheck in 1 month LMOM for patient to Return call.

## 2014-12-06 NOTE — Telephone Encounter (Signed)
Patient Notified and agreed. Faxed results to [facility] with [facility]

## 2014-12-09 DIAGNOSIS — N183 Chronic kidney disease, stage 3 (moderate): Secondary | ICD-10-CM | POA: Diagnosis not present

## 2014-12-09 DIAGNOSIS — I509 Heart failure, unspecified: Secondary | ICD-10-CM | POA: Diagnosis not present

## 2014-12-09 DIAGNOSIS — Z961 Presence of intraocular lens: Secondary | ICD-10-CM | POA: Diagnosis not present

## 2014-12-09 DIAGNOSIS — Z881 Allergy status to other antibiotic agents status: Secondary | ICD-10-CM | POA: Diagnosis not present

## 2014-12-09 DIAGNOSIS — H2511 Age-related nuclear cataract, right eye: Secondary | ICD-10-CM | POA: Diagnosis not present

## 2014-12-09 DIAGNOSIS — E119 Type 2 diabetes mellitus without complications: Secondary | ICD-10-CM | POA: Diagnosis not present

## 2014-12-09 DIAGNOSIS — Z7901 Long term (current) use of anticoagulants: Secondary | ICD-10-CM | POA: Diagnosis not present

## 2014-12-09 DIAGNOSIS — I359 Nonrheumatic aortic valve disorder, unspecified: Secondary | ICD-10-CM | POA: Diagnosis not present

## 2014-12-09 DIAGNOSIS — I4891 Unspecified atrial fibrillation: Secondary | ICD-10-CM | POA: Diagnosis not present

## 2014-12-09 DIAGNOSIS — I495 Sick sinus syndrome: Secondary | ICD-10-CM | POA: Diagnosis not present

## 2014-12-09 DIAGNOSIS — Z880 Allergy status to penicillin: Secondary | ICD-10-CM | POA: Diagnosis not present

## 2014-12-15 ENCOUNTER — Other Ambulatory Visit: Payer: Self-pay | Admitting: Nurse Practitioner

## 2014-12-16 DIAGNOSIS — Z6822 Body mass index (BMI) 22.0-22.9, adult: Secondary | ICD-10-CM | POA: Diagnosis not present

## 2014-12-16 DIAGNOSIS — H2511 Age-related nuclear cataract, right eye: Secondary | ICD-10-CM | POA: Diagnosis not present

## 2014-12-16 DIAGNOSIS — N183 Chronic kidney disease, stage 3 (moderate): Secondary | ICD-10-CM | POA: Diagnosis not present

## 2014-12-16 DIAGNOSIS — I509 Heart failure, unspecified: Secondary | ICD-10-CM | POA: Diagnosis not present

## 2014-12-16 DIAGNOSIS — I4891 Unspecified atrial fibrillation: Secondary | ICD-10-CM | POA: Diagnosis not present

## 2014-12-16 DIAGNOSIS — E119 Type 2 diabetes mellitus without complications: Secondary | ICD-10-CM | POA: Diagnosis not present

## 2014-12-16 DIAGNOSIS — Z7901 Long term (current) use of anticoagulants: Secondary | ICD-10-CM | POA: Diagnosis not present

## 2014-12-16 DIAGNOSIS — I495 Sick sinus syndrome: Secondary | ICD-10-CM | POA: Diagnosis not present

## 2014-12-16 DIAGNOSIS — H25811 Combined forms of age-related cataract, right eye: Secondary | ICD-10-CM | POA: Diagnosis not present

## 2014-12-17 DIAGNOSIS — Z48810 Encounter for surgical aftercare following surgery on the sense organs: Secondary | ICD-10-CM | POA: Diagnosis not present

## 2014-12-17 DIAGNOSIS — Z9841 Cataract extraction status, right eye: Secondary | ICD-10-CM | POA: Diagnosis not present

## 2015-01-06 DIAGNOSIS — I4891 Unspecified atrial fibrillation: Secondary | ICD-10-CM | POA: Diagnosis not present

## 2015-01-06 DIAGNOSIS — Z7901 Long term (current) use of anticoagulants: Secondary | ICD-10-CM | POA: Diagnosis not present

## 2015-01-06 LAB — PROTIME-INR: PROTIME: 29.1 s — AB (ref 10.0–13.8)

## 2015-01-06 LAB — POCT INR: INR: 2.8 — AB (ref 0.9–1.1)

## 2015-01-07 ENCOUNTER — Telehealth: Payer: Self-pay | Admitting: *Deleted

## 2015-01-07 NOTE — Telephone Encounter (Signed)
Protime Results from [facility] 01/06/2015  PT:  29.1 INR: 2.75  Current Dose of Coumadin: 5mg  once daily Per Dr. Mariea Clonts: Keep same dose of Coumadin and Recheck in 1 month Patient Notified and agreed. Faxed results to [facility]

## 2015-01-15 ENCOUNTER — Other Ambulatory Visit: Payer: Self-pay | Admitting: Nurse Practitioner

## 2015-01-21 DIAGNOSIS — H3532 Exudative age-related macular degeneration: Secondary | ICD-10-CM | POA: Diagnosis not present

## 2015-01-21 DIAGNOSIS — H3589 Other specified retinal disorders: Secondary | ICD-10-CM | POA: Insufficient documentation

## 2015-01-21 DIAGNOSIS — H3531 Nonexudative age-related macular degeneration: Secondary | ICD-10-CM | POA: Diagnosis not present

## 2015-01-21 DIAGNOSIS — H353190 Nonexudative age-related macular degeneration, unspecified eye, stage unspecified: Secondary | ICD-10-CM | POA: Insufficient documentation

## 2015-01-21 DIAGNOSIS — Z961 Presence of intraocular lens: Secondary | ICD-10-CM | POA: Diagnosis not present

## 2015-02-03 ENCOUNTER — Telehealth: Payer: Self-pay | Admitting: *Deleted

## 2015-02-03 DIAGNOSIS — I4891 Unspecified atrial fibrillation: Secondary | ICD-10-CM | POA: Diagnosis not present

## 2015-02-03 DIAGNOSIS — Z7901 Long term (current) use of anticoagulants: Secondary | ICD-10-CM | POA: Diagnosis not present

## 2015-02-03 LAB — POCT INR: INR: 2.7 — AB (ref 0.9–1.1)

## 2015-02-03 LAB — PROTIME-INR: Protime: 29 seconds — AB (ref 10.0–13.8)

## 2015-02-03 NOTE — Telephone Encounter (Signed)
Protime results from [facility] 02/03/2015  PT:  29.0 INR:  2.74   Current Dose of Coumadin:  5mg  daily Given to Nancy Payne to review and sign.

## 2015-02-03 NOTE — Telephone Encounter (Signed)
Per Sherrie Mustache, NP --Patient to Continue 5mg  of Coumadin and recheck in 1 month Patient Notified and Agreed. Given to [facility] for appointment scheduling.

## 2015-02-04 DIAGNOSIS — I10 Essential (primary) hypertension: Secondary | ICD-10-CM | POA: Diagnosis not present

## 2015-02-04 DIAGNOSIS — I4891 Unspecified atrial fibrillation: Secondary | ICD-10-CM | POA: Diagnosis not present

## 2015-02-04 DIAGNOSIS — E1129 Type 2 diabetes mellitus with other diabetic kidney complication: Secondary | ICD-10-CM | POA: Diagnosis not present

## 2015-02-04 DIAGNOSIS — Z7901 Long term (current) use of anticoagulants: Secondary | ICD-10-CM | POA: Diagnosis not present

## 2015-02-04 LAB — POCT INR: INR: 3.1 — AB (ref 0.9–1.1)

## 2015-02-04 LAB — HEPATIC FUNCTION PANEL
ALK PHOS: 82 U/L (ref 25–125)
ALT: 15 U/L (ref 7–35)
AST: 22 U/L (ref 13–35)
Bilirubin, Total: 1.1 mg/dL

## 2015-02-04 LAB — BASIC METABOLIC PANEL
BUN: 37 mg/dL — AB (ref 4–21)
CREATININE: 1.2 mg/dL — AB (ref 0.5–1.1)
GLUCOSE: 145 mg/dL
POTASSIUM: 4.5 mmol/L (ref 3.4–5.3)
SODIUM: 141 mmol/L (ref 137–147)

## 2015-02-04 LAB — LIPID PANEL
CHOLESTEROL: 104 mg/dL (ref 0–200)
HDL: 33 mg/dL — AB (ref 35–70)
LDL Cholesterol: 53 mg/dL
Triglycerides: 90 mg/dL (ref 40–160)

## 2015-02-04 LAB — HEMOGLOBIN A1C: Hgb A1c MFr Bld: 8.1 % — AB (ref 4.0–6.0)

## 2015-02-07 ENCOUNTER — Ambulatory Visit (INDEPENDENT_AMBULATORY_CARE_PROVIDER_SITE_OTHER): Payer: Medicare Other | Admitting: *Deleted

## 2015-02-07 DIAGNOSIS — I495 Sick sinus syndrome: Secondary | ICD-10-CM

## 2015-02-07 NOTE — Progress Notes (Signed)
Remote pacemaker transmission.   

## 2015-02-08 LAB — MDC_IDC_ENUM_SESS_TYPE_REMOTE
Battery Impedance: 100 Ohm
Battery Voltage: 2.8 V
Brady Statistic AP VS Percent: 0 %
Brady Statistic AS VP Percent: 18 %
Lead Channel Impedance Value: 432 Ohm
Lead Channel Pacing Threshold Pulse Width: 0.4 ms
Lead Channel Setting Pacing Amplitude: 2.5 V
Lead Channel Setting Pacing Amplitude: 2.5 V
Lead Channel Setting Pacing Pulse Width: 0.4 ms
Lead Channel Setting Sensing Sensitivity: 2 mV
MDC IDC MSMT BATTERY REMAINING LONGEVITY: 153 mo
MDC IDC MSMT LEADCHNL RV IMPEDANCE VALUE: 541 Ohm
MDC IDC MSMT LEADCHNL RV PACING THRESHOLD AMPLITUDE: 0.75 V
MDC IDC MSMT LEADCHNL RV SENSING INTR AMPL: 5.6 mV
MDC IDC SESS DTM: 20160307162914
MDC IDC STAT BRADY AP VP PERCENT: 5 %
MDC IDC STAT BRADY AS VS PERCENT: 77 %

## 2015-02-14 ENCOUNTER — Encounter: Payer: Self-pay | Admitting: Cardiology

## 2015-02-17 ENCOUNTER — Encounter: Payer: Self-pay | Admitting: Internal Medicine

## 2015-03-04 ENCOUNTER — Ambulatory Visit: Payer: Medicare Other

## 2015-03-07 DIAGNOSIS — W19XXXA Unspecified fall, initial encounter: Secondary | ICD-10-CM | POA: Insufficient documentation

## 2015-03-15 ENCOUNTER — Encounter: Payer: Self-pay | Admitting: Internal Medicine

## 2015-03-15 ENCOUNTER — Non-Acute Institutional Stay: Payer: Medicare Other | Admitting: Internal Medicine

## 2015-03-15 VITALS — BP 122/82 | HR 72 | Temp 97.4°F | Wt 130.0 lb

## 2015-03-15 DIAGNOSIS — E785 Hyperlipidemia, unspecified: Secondary | ICD-10-CM

## 2015-03-15 DIAGNOSIS — I10 Essential (primary) hypertension: Secondary | ICD-10-CM

## 2015-03-15 DIAGNOSIS — I4891 Unspecified atrial fibrillation: Secondary | ICD-10-CM

## 2015-03-15 DIAGNOSIS — R58 Hemorrhage, not elsewhere classified: Secondary | ICD-10-CM | POA: Insufficient documentation

## 2015-03-15 DIAGNOSIS — E1129 Type 2 diabetes mellitus with other diabetic kidney complication: Secondary | ICD-10-CM

## 2015-03-15 DIAGNOSIS — Z7901 Long term (current) use of anticoagulants: Secondary | ICD-10-CM | POA: Diagnosis not present

## 2015-03-15 DIAGNOSIS — W19XXXA Unspecified fall, initial encounter: Secondary | ICD-10-CM

## 2015-03-15 DIAGNOSIS — IMO0002 Reserved for concepts with insufficient information to code with codable children: Secondary | ICD-10-CM

## 2015-03-15 DIAGNOSIS — R143 Flatulence: Secondary | ICD-10-CM | POA: Diagnosis not present

## 2015-03-15 DIAGNOSIS — I509 Heart failure, unspecified: Secondary | ICD-10-CM | POA: Diagnosis not present

## 2015-03-15 DIAGNOSIS — N183 Chronic kidney disease, stage 3 unspecified: Secondary | ICD-10-CM

## 2015-03-15 DIAGNOSIS — R609 Edema, unspecified: Secondary | ICD-10-CM

## 2015-03-15 DIAGNOSIS — R142 Eructation: Secondary | ICD-10-CM

## 2015-03-15 DIAGNOSIS — E1165 Type 2 diabetes mellitus with hyperglycemia: Secondary | ICD-10-CM | POA: Diagnosis not present

## 2015-03-15 DIAGNOSIS — R141 Gas pain: Secondary | ICD-10-CM | POA: Insufficient documentation

## 2015-03-15 MED ORDER — METFORMIN HCL 500 MG PO TABS: ORAL_TABLET | ORAL | Status: DC

## 2015-03-15 MED ORDER — WARFARIN SODIUM 5 MG PO TABS: ORAL_TABLET | ORAL | Status: DC

## 2015-03-15 NOTE — Progress Notes (Signed)
Patient ID: Nancy Payne, female   DOB: 08-22-1922, 81 y.o.   MRN: [id number]    [facility]     Place of Service: Clinic (12)     Allergies  Allergen Reactions  . Penicillins Other (See Comments)    Temperature went up to 104 F  . Penicillin G Benzathine & Proc Other (See Comments)    Unknown    Chief Complaint  Patient presents with  . Medical Management of Chronic Issues    CHF, A-Fib, CKD, DM    HPI:   Atrial fibrillation, unspecified - rate controlled. Anticoagulated.  DM type 2, uncontrolled, with renal complications: Not following a diet very well. Only takes glimepiride 2 mg daily. Has never been on metformin.  Essential hypertension: Controlled  Kidney disease, chronic, stage III (GFR 30-59 ml/min): Recent values suggest some improvement.  Chronic congestive heart failure, unspecified congestive heart failure type: Unchanged and stable.  Edema: 2+ bipedal  Long term current use of anticoagulant therapy: Recent INR therapeutic at 3.1 and 5 mg Coumadin daily  Hyperlipidemia: Low HDL, but also has a very low total cholesterol at 90 and LDL at 53.  Flatulence, eructation and gas pain: Increasing problems of both belching and flatulence. No abdominal pain. Denies indigestion.  Fall, initial encounter: [place] outside a hotdog stand 8 days ago. Has large ecchymosis of the sacrum extending down the left leg. Did not go to the emergency room. She has been able to walk as normal other than some stiffness and pain at the site of the ecchymosis.  Ecchymosis: Large ecchymosis at sacrum extending down the left buttock and leg.    Medications: Patient's Medications  New Prescriptions   No medications on file  Previous Medications   FUROSEMIDE (LASIX) 40 MG TABLET    Take 40 mg by mouth daily.   GLIMEPIRIDE (AMARYL) 2 MG TABLET    Take 2 mg by mouth daily with breakfast.   METOPROLOL TARTRATE (LOPRESSOR) 25 MG TABLET    Take 25 mg by mouth 2 (two) times  daily.   MULTIPLE VITAMINS-MINERALS (MULTIVITAMIN PO)    Take 1 tablet by mouth daily.   ROSUVASTATIN (CRESTOR) 10 MG TABLET    Take 10 mg by mouth at bedtime.   SPIRONOLACTONE (ALDACTONE) 25 MG TABLET    Take 25 mg by mouth daily.   WARFARIN (COUMADIN) 2.5 MG TABLET    Take daily as directed  Modified Medications   No medications on file  Discontinued Medications   MOXIFLOXACIN (VIGAMOX) 0.5 % OPHTHALMIC SOLUTION    1 drop. Place one drop into the left eye 4 times daily   PREDNISOLONE ACETATE (PRED FORTE) 1 % OPHTHALMIC SUSPENSION    1 drop. One drop in left eye 4 times daily   WARFARIN (COUMADIN) 2.5 MG TABLET    TAKE AS DIRECTED BY COUMADIN CLINIC   WARFARIN (COUMADIN) 5 MG TABLET    Take as directed by the coumadin clinic     Review of Systems  Constitutional: Positive for activity change and fatigue. Negative for fever, chills, appetite change and unexpected weight change.  HENT: Negative for congestion, ear pain, mouth sores, postnasal drip, rhinorrhea, sinus pressure and sore throat.   Respiratory: Negative for cough, chest tightness and shortness of breath.   Cardiovascular: Negative for chest pain, palpitations and leg swelling.  Gastrointestinal: Negative for nausea, abdominal pain, diarrhea, constipation, blood in stool and abdominal distention.  Endocrine: Negative.   Genitourinary: Negative for dysuria, decreased urine volume and  difficulty urinating.  Musculoskeletal: Positive for back pain and gait problem (Recent fall 03/07/2015). Negative for myalgias, joint swelling, neck pain and neck stiffness.  Skin:       Large ecchymosis of the sacrum and left buttock due to a fall.  Allergic/Immunologic: Negative.   Neurological: Negative for dizziness, tremors, seizures, syncope, facial asymmetry, speech difficulty, weakness, light-headedness and headaches.  Hematological: Negative.   Psychiatric/Behavioral: Negative for confusion, sleep disturbance, self-injury, decreased  concentration and agitation. The patient is not nervous/anxious and is not hyperactive.     Filed Vitals:   03/15/15 0923  BP: 122/82  Pulse: 72  Temp: 97.4 F (36.3 C)  TempSrc: Oral  Weight: 130 lb (58.968 kg)  SpO2: 98%   Body mass index is 23.03 kg/(m^2).  Physical Exam  Constitutional: She is oriented to person, place, and time. She appears well-developed. No distress.  HENT:  Head: Normocephalic and atraumatic.  Nose: Nose normal.  Eyes: Conjunctivae and EOM are normal. Pupils are equal, round, and reactive to light. Left eye exhibits no discharge. No scleral icterus.  Neck: Normal range of motion. Neck supple. No JVD present. No tracheal deviation present. No thyromegaly present.  Cardiovascular: Exam reveals no friction rub.   Murmur heard. 3/6 LSB SEM. Pacemaker in left upper chest. AF.  Pulmonary/Chest: No respiratory distress. She has no wheezes. She has no rales.  Abdominal: Bowel sounds are normal. She exhibits no distension and no mass. There is no tenderness.  Musculoskeletal: Normal range of motion. She exhibits no edema or tenderness.  Edematous legs bilaterally.  Lymphadenopathy:    She has no cervical adenopathy.  Neurological: She is alert and oriented to person, place, and time. She has normal reflexes. No cranial nerve deficit. Coordination normal.  Skin: Skin is dry. No rash noted. No erythema. No pallor.  Not wearing compression stockings. Has a large ecchymosis of the sacrum and left buttock extending down the left leg.  Psychiatric: She has a normal mood and affect. Her behavior is normal. Thought content normal.     Labs reviewed: Nursing Home on 03/15/2015  Component Date Value Ref Range Status  . INR 02/04/2015 3.1* 0.9 - 1.1 Final  . Glucose 02/04/2015 145   Final  . BUN 02/04/2015 37* 4 - 21 mg/dL Final  . Creatinine 02/04/2015 1.2* 0.5 - 1.1 mg/dL Final  . Potassium 02/04/2015 4.5  3.4 - 5.3 mmol/L Final  . Sodium 02/04/2015 141  137 - 147  mmol/L Final  . Triglycerides 02/04/2015 90  40 - 160 mg/dL Final  . Cholesterol 02/04/2015 104  0 - 200 mg/dL Final  . HDL 02/04/2015 33* 35 - 70 mg/dL Final  . LDL Cholesterol 02/04/2015 53   Final  . Alkaline Phosphatase 02/04/2015 82  25 - 125 U/L Final  . ALT 02/04/2015 15  7 - 35 U/L Final  . AST 02/04/2015 22  13 - 35 U/L Final  . Bilirubin, Total 02/04/2015 1.1   Final  . Hgb A1c MFr Bld 02/04/2015 8.1* 4.0 - 6.0 % Final  Clinical Support on 02/07/2015  Component Date Value Ref Range Status  . Date Time Interrogation Session 02/08/2015 [id number]   Final  . Implantable Pulse Generator Manufa* 02/08/2015 Medtronic   Final  . Implantable Pulse Generator Model 02/08/2015 ADDRL1 Adapta   Final  . Implantable Pulse Generator Serial* 02/08/2015 [id number]   Final  . Lead Channel Setting Sensing Sensi* 02/08/2015 2   Final  . Lead Channel Setting Pacing Amplit* 02/08/2015 2.5  Final  . Lead Channel Setting Pacing Pulse * 02/08/2015 0.4   Final  . Lead Channel Setting Pacing Amplit* 02/08/2015 2.5   Final  . Lead Channel Impedance Value 02/08/2015 432   Final  . Lead Channel Impedance Value 02/08/2015 541   Final  . Lead Channel Sensing Intrinsic Amp* 02/08/2015 5.6   Final  . Lead Channel Pacing Threshold Ampl* 02/08/2015 0.75   Final  . Lead Channel Pacing Threshold Puls* 02/08/2015 0.4   Final  . Battery Status 02/08/2015 Unknown   Final  . Battery Remaining Longevity 02/08/2015 153   Final  . Battery Voltage 02/08/2015 2.8   Final  . Battery Impedance 02/08/2015 100   Final  . Loletha Grayer Statistic AP VP Percent 02/08/2015 5   Final  . Loletha Grayer Statistic AS VP Percent 02/08/2015 18   Final  . Loletha Grayer Statistic AP VS Percent 02/08/2015 0   Final  . Loletha Grayer Statistic AS VS Percent 02/08/2015 77   Final  . Eval Rhythm 02/08/2015 Afl-Vp   Final  . Miscellaneous Comment 02/08/2015    Final                   Value:Pacemaker remote check. Device function reviewed. Impedance, sensing, auto  capture threshold consistent with previous measurements. Histograms appropriate for patient and level of activity. All other diagnostic data reviewed and is appropriate and stable  for patient. Real time/magnet EGM shows appropriate sensing and capture. 99.2% mode switch + warfarin. No ventricular high rate episodes. Estimated longevity 12.6yrs. Carelink 05/09/15 & ROV w/ AS 12/16.   Telephone on 02/03/2015  Component Date Value Ref Range Status  . INR 02/03/2015 2.7* 0.9 - 1.1 Final  . Protime 02/03/2015 29.0* 10.0 - 13.8 seconds Final  Telephone on 01/07/2015  Component Date Value Ref Range Status  . INR 01/06/2015 2.8* 0.9 - 1.1 Final  . Protime 01/06/2015 29.1* 10.0 - 13.8 seconds Final     Assessment/Plan  1. Atrial fibrillation, unspecified: controlled rate - POC INR in one maonth  2. DM type 2, uncontrolled, with renal complications -Admit metformin 500 mg each morning  3. Essential hypertension Controlled  4. Kidney disease, chronic, stage III (GFR 30-59 ml/min) -CMP next visit  5. Chronic congestive heart failure, unspecified congestive heart failure type -Controlled  6. Edema -Controlled  7. Long term current use of anticoagulant therapy -INR one month  8. Hyperlipidemia -Lipid panel prior to next visit  9. Flatulence, eructation and gas pain -Try Beano or Gas-X  10. Fall, initial encounter Not using a walker. Does not feel she needs it.  11. Ecchymosis Observe.

## 2015-03-23 ENCOUNTER — Ambulatory Visit: Payer: Medicare Other | Admitting: Nurse Practitioner

## 2015-03-24 ENCOUNTER — Encounter: Payer: Self-pay | Admitting: Internal Medicine

## 2015-03-25 ENCOUNTER — Telehealth: Payer: Self-pay

## 2015-03-25 NOTE — Telephone Encounter (Signed)
Message left on triage voicemail. Call to discuss dizziness associated with Metformin.  Spoke with patient, patient states day one she tolerated medication well, day two patient felt dizzy (took with glipizide), day three patient took in the evening alone and tolerated well.  Patient would like specifics- patient needs to know when/time of day she should take medication and if it should be taken with or without food.  Please advise

## 2015-03-28 NOTE — Telephone Encounter (Signed)
It is okay to take the metformin either in the morning or in the evening. It does not have to be taken with food.

## 2015-03-28 NOTE — Telephone Encounter (Signed)
Spoke with patient, patient verbalized understanding of results

## 2015-04-01 ENCOUNTER — Other Ambulatory Visit: Payer: Self-pay | Admitting: *Deleted

## 2015-04-01 MED ORDER — FUROSEMIDE 40 MG PO TABS: 40.0000 mg | ORAL_TABLET | Freq: Every day | ORAL | Status: DC

## 2015-04-01 NOTE — Telephone Encounter (Signed)
[facility] 

## 2015-04-03 ENCOUNTER — Other Ambulatory Visit: Payer: Self-pay | Admitting: Internal Medicine

## 2015-04-12 ENCOUNTER — Encounter: Payer: Self-pay | Admitting: Internal Medicine

## 2015-04-12 ENCOUNTER — Non-Acute Institutional Stay: Payer: Medicare Other | Admitting: Internal Medicine

## 2015-04-12 VITALS — BP 138/70 | HR 60 | Wt 125.0 lb

## 2015-04-12 DIAGNOSIS — IMO0002 Reserved for concepts with insufficient information to code with codable children: Secondary | ICD-10-CM

## 2015-04-12 DIAGNOSIS — I10 Essential (primary) hypertension: Secondary | ICD-10-CM

## 2015-04-12 DIAGNOSIS — Z7901 Long term (current) use of anticoagulants: Secondary | ICD-10-CM | POA: Diagnosis not present

## 2015-04-12 DIAGNOSIS — E1129 Type 2 diabetes mellitus with other diabetic kidney complication: Secondary | ICD-10-CM

## 2015-04-12 DIAGNOSIS — E1165 Type 2 diabetes mellitus with hyperglycemia: Secondary | ICD-10-CM | POA: Diagnosis not present

## 2015-04-12 NOTE — Progress Notes (Signed)
Patient ID: Nancy Payne, female   DOB: Jul 07, 1922, 81 y.o.   MRN: [id number]    [facility]     Place of Service: Clinic (12)     Allergies  Allergen Reactions  . Penicillins Other (See Comments)    Temperature went up to 104 F  . Penicillin G Benzathine & Proc Other (See Comments)    Unknown    Chief Complaint  Patient presents with  . Medical Management of Chronic Issues    coag check on 5 mg Warfarin daily    HPI:  Long term current use of anticoagulant therapy: For aortic valvular stenosis and atrial fibrillation.  Essential hypertension: Controlled  DM type 2, uncontrolled, with renal complications: Controlled    Medications: Patient's Medications  New Prescriptions   No medications on file  Previous Medications   FUROSEMIDE (LASIX) 40 MG TABLET    Take 1 tablet (40 mg total) by mouth daily.   GLIMEPIRIDE (AMARYL) 2 MG TABLET    Take 2 mg by mouth daily with breakfast.   METFORMIN (GLUCOPHAGE) 500 MG TABLET    One daily to control glucose   METOPROLOL TARTRATE (LOPRESSOR) 25 MG TABLET    Take 25 mg by mouth 2 (two) times daily.   MULTIPLE VITAMINS-MINERALS (MULTIVITAMIN PO)    Take 1 tablet by mouth daily.   ROSUVASTATIN (CRESTOR) 10 MG TABLET    Take 10 mg by mouth at bedtime.   SPIRONOLACTONE (ALDACTONE) 25 MG TABLET    Take 25 mg by mouth daily.   WARFARIN (COUMADIN) 5 MG TABLET    One daily for anticoagulation  Modified Medications   No medications on file  Discontinued Medications   SPIRONOLACTONE (ALDACTONE) 25 MG TABLET    TAKE ONE TABLET BY MOUTH ONCE DAILY TO HELP CONTROL EDEMA     Review of Systems  Constitutional: Positive for activity change and fatigue. Negative for fever, chills, appetite change and unexpected weight change.  HENT: Negative for congestion, ear pain, mouth sores, postnasal drip, rhinorrhea, sinus pressure and sore throat.   Respiratory: Negative for cough, chest tightness and shortness of breath.     Cardiovascular: Negative for chest pain, palpitations and leg swelling.       History of atrial fibrillation and aortic valvular stenosis. She is on chronic anticoagulation with warfarin for these problems.  Gastrointestinal: Negative for nausea, abdominal pain, diarrhea, constipation, blood in stool and abdominal distention.  Endocrine: Negative.   Genitourinary: Negative for dysuria, decreased urine volume and difficulty urinating.  Musculoskeletal: Positive for back pain and gait problem (Recent fall 03/07/2015). Negative for myalgias, joint swelling, neck pain and neck stiffness.  Skin:       Large ecchymosis of the sacrum and left buttock due to a fall.  Allergic/Immunologic: Negative.   Neurological: Negative for dizziness, tremors, seizures, syncope, facial asymmetry, speech difficulty, weakness, light-headedness and headaches.  Hematological: Negative.   Psychiatric/Behavioral: Negative for confusion, sleep disturbance, self-injury, decreased concentration and agitation. The patient is not nervous/anxious and is not hyperactive.     Filed Vitals:   04/12/15 1111  BP: 138/70  Pulse: 60  Weight: 125 lb (56.7 kg)   Body mass index is 22.15 kg/(m^2).  Physical Exam  Constitutional: She is oriented to person, place, and time. She appears well-developed. No distress.  HENT:  Head: Normocephalic and atraumatic.  Nose: Nose normal.  Eyes: Conjunctivae and EOM are normal. Pupils are equal, round, and reactive to light. Left eye exhibits no discharge. No scleral  icterus.  Neck: Normal range of motion. Neck supple. No JVD present. No tracheal deviation present. No thyromegaly present.  Cardiovascular: Exam reveals no friction rub.   Murmur heard. 3/6 LSB SEM. Pacemaker in left upper chest. AF.  Pulmonary/Chest: No respiratory distress. She has no wheezes. She has no rales.  Abdominal: Bowel sounds are normal. She exhibits no distension and no mass. There is no tenderness.   Musculoskeletal: Normal range of motion. She exhibits no edema or tenderness.  Edematous legs bilaterally.  Lymphadenopathy:    She has no cervical adenopathy.  Neurological: She is alert and oriented to person, place, and time. She has normal reflexes. No cranial nerve deficit. Coordination normal.  Skin: Skin is dry. No rash noted. No erythema. No pallor.  Not wearing compression stockings. Has a large ecchymosis of the sacrum and left buttock extending down the left leg.  Psychiatric: She has a normal mood and affect. Her behavior is normal. Thought content normal.     Labs reviewed: Nursing Home on 03/15/2015  Component Date Value Ref Range Status  . INR 02/04/2015 3.1* 0.9 - 1.1 Final  . Glucose 02/04/2015 145   Final  . BUN 02/04/2015 37* 4 - 21 mg/dL Final  . Creatinine 02/04/2015 1.2* 0.5 - 1.1 mg/dL Final  . Potassium 02/04/2015 4.5  3.4 - 5.3 mmol/L Final  . Sodium 02/04/2015 141  137 - 147 mmol/L Final  . Triglycerides 02/04/2015 90  40 - 160 mg/dL Final  . Cholesterol 02/04/2015 104  0 - 200 mg/dL Final  . HDL 02/04/2015 33* 35 - 70 mg/dL Final  . LDL Cholesterol 02/04/2015 53   Final  . Alkaline Phosphatase 02/04/2015 82  25 - 125 U/L Final  . ALT 02/04/2015 15  7 - 35 U/L Final  . AST 02/04/2015 22  13 - 35 U/L Final  . Bilirubin, Total 02/04/2015 1.1   Final  . Hgb A1c MFr Bld 02/04/2015 8.1* 4.0 - 6.0 % Final  Clinical Support on 02/07/2015  Component Date Value Ref Range Status  . Date Time Interrogation Session 02/08/2015 [id number]   Final  . Implantable Pulse Generator Manufa* 02/08/2015 Medtronic   Final  . Implantable Pulse Generator Model 02/08/2015 ADDRL1 Adapta   Final  . Implantable Pulse Generator Serial* 02/08/2015 [id number]   Final  . Lead Channel Setting Sensing Sensi* 02/08/2015 2   Final  . Lead Channel Setting Pacing Amplit* 02/08/2015 2.5   Final  . Lead Channel Setting Pacing Pulse * 02/08/2015 0.4   Final  . Lead Channel Setting Pacing  Amplit* 02/08/2015 2.5   Final  . Lead Channel Impedance Value 02/08/2015 432   Final  . Lead Channel Impedance Value 02/08/2015 541   Final  . Lead Channel Sensing Intrinsic Amp* 02/08/2015 5.6   Final  . Lead Channel Pacing Threshold Ampl* 02/08/2015 0.75   Final  . Lead Channel Pacing Threshold Puls* 02/08/2015 0.4   Final  . Battery Status 02/08/2015 Unknown   Final  . Battery Remaining Longevity 02/08/2015 153   Final  . Battery Voltage 02/08/2015 2.8   Final  . Battery Impedance 02/08/2015 100   Final  . Loletha Grayer Statistic AP VP Percent 02/08/2015 5   Final  . Loletha Grayer Statistic AS VP Percent 02/08/2015 18   Final  . Loletha Grayer Statistic AP VS Percent 02/08/2015 0   Final  . Loletha Grayer Statistic AS VS Percent 02/08/2015 77   Final  . Eval Rhythm 02/08/2015 Afl-Vp   Final  .  Miscellaneous Comment 02/08/2015    Final                   Value:Pacemaker remote check. Device function reviewed. Impedance, sensing, auto capture threshold consistent with previous measurements. Histograms appropriate for patient and level of activity. All other diagnostic data reviewed and is appropriate and stable  for patient. Real time/magnet EGM shows appropriate sensing and capture. 99.2% mode switch + warfarin. No ventricular high rate episodes. Estimated longevity 12.46yrs. Carelink 05/09/15 & ROV w/ AS 12/16.   Telephone on 02/03/2015  Component Date Value Ref Range Status  . INR 02/03/2015 2.7* 0.9 - 1.1 Final  . Protime 02/03/2015 29.0* 10.0 - 13.8 seconds Final     Assessment/Plan  1. Long term current use of anticoagulant therapy Latest INR 2.4. Continue current dose of warfarin 5 mg daily.  2. Essential hypertension Controlled  3. DM type 2, uncontrolled, with renal complications Controlled

## 2015-04-22 ENCOUNTER — Ambulatory Visit (INDEPENDENT_AMBULATORY_CARE_PROVIDER_SITE_OTHER): Payer: Medicare Other | Admitting: Nurse Practitioner

## 2015-04-22 ENCOUNTER — Encounter: Payer: Self-pay | Admitting: Nurse Practitioner

## 2015-04-22 VITALS — BP 100/80 | HR 80 | Ht 63.0 in | Wt 127.1 lb

## 2015-04-22 DIAGNOSIS — I10 Essential (primary) hypertension: Secondary | ICD-10-CM | POA: Diagnosis not present

## 2015-04-22 DIAGNOSIS — I495 Sick sinus syndrome: Secondary | ICD-10-CM | POA: Diagnosis not present

## 2015-04-22 DIAGNOSIS — I482 Chronic atrial fibrillation, unspecified: Secondary | ICD-10-CM

## 2015-04-22 DIAGNOSIS — I5022 Chronic systolic (congestive) heart failure: Secondary | ICD-10-CM | POA: Diagnosis not present

## 2015-04-22 NOTE — Patient Instructions (Addendum)
We will be checking the following labs today - NONE   Medication Instructions:    Continue with your current medicines.     Testing/Procedures To Be Arranged:  N/A  Follow-Up:   I will see you back in 1 year   Other Special Instructions:   N/A  Call the [place] office at [phone] if you have any questions, problems or concerns.

## 2015-04-22 NOTE — Progress Notes (Signed)
CARDIOLOGY OFFICE NOTE  Date:  04/22/2015    Nancy Payne Date of Birth: 1921-12-22 Medical Record #[id number]  PCP:  Estill Dooms, MD  Cardiologist:  [place] & Allred  Chief Complaint  Patient presents with  . Congestive Heart Failure    Follow up visit - seen for Dr. Rayann Heman & [place]    History of Present Illness: Nancy Payne is a 81 y.o. female who presents today for a follow up visit. Seen for Dr. Martinique. Sees Dr. Rayann Heman for EP. She has multiple issues which include underlying pacemaker for SSS, paroxysmal atrial fib, AS with past AVR, HTN, edema, type 2 DM, HLD, spinal stenosis and chronic LV dysfunction. Echo from April of 2014 showed an EF of 40%. She underwent AVR with resection and grafting of the proximal descending thoracic aorta and repair of a left ventricular perforation in September of 2005. She has had past right leg fracture.   She had been placed in assisted living care. Last saw Dr. Rayann Heman in December and was doing ok. She had been noted to have reverted back to atrial flutter.   Comes in today. Here with her daughter in law - Judson Roch. Doing very well. Just had a check up with Dr. Nyoka Cowden. Brings labs for me to review. She says she is doing good.No chest pain. Not short of breath. Remains pretty active. No falls. Not dizzy or lightheaded. Feels ok on her medicines.   Past Medical History  Diagnosis Date  . Sick sinus syndrome     post DDD pacemaker  . Hypertension   . Edema   . Type II or unspecified type diabetes mellitus with renal manifestations, not stated as uncontrolled     type 2  . Hyperlipidemia   . Atrial fibrillation     persistent afib/ atrial flutter  . Macular degeneration 2012    left   . Skin cancer of nose 2012    with skin graft  . Kidney disease, chronic, stage III (GFR 30-59 ml/min)   . Eczema   . Xerostomia 2012  . Xerophthalmia 2012  . Brain atrophy 2009  . Congestive heart failure (CHF)     Past Surgical History    Procedure Laterality Date  . Pacemaker insertion  2005; 07-19-14    MDT ADDRL1 pacemaker generator change by Dr Rayann Heman [id number]  . Cardiac valve surgery  2005    Dr. Ricard Dillon  . Ankle surgery Right 1990    fracture as steel plate   . Knee surgery Right 1990    fracture has plate and rod in femur  . Colonoscopy  2008  . Pacemaker generator change  07/19/2014    Dr. Rayann Heman MDT  . Permanent pacemaker generator change N/A 07/19/2014    Procedure: PERMANENT PACEMAKER GENERATOR CHANGE;  Surgeon: Coralyn Mark, MD;  Location: [facility] CATH [facility];  Service: Cardiovascular;  Laterality: N/A;  . Cataract extraction w/ intraocular lens implant Left 11/11/14    [place] surgery       Medications: Current Outpatient Prescriptions  Medication Sig Dispense Refill  . furosemide (LASIX) 40 MG tablet Take 1 tablet (40 mg total) by mouth daily. 90 tablet 0  . glimepiride (AMARYL) 2 MG tablet Take 2 mg by mouth daily with breakfast.    . metFORMIN (GLUCOPHAGE) 500 MG tablet One daily to control glucose (Patient taking differently: One daily to control glucose with dinner) 90 tablet 3  . metoprolol tartrate (LOPRESSOR) 25 MG  tablet Take 25 mg by mouth 2 (two) times daily.    . Multiple Vitamins-Minerals (MULTIVITAMIN PO) Take 1 tablet by mouth daily.    . rosuvastatin (CRESTOR) 10 MG tablet Take 10 mg by mouth at bedtime.    Marland Kitchen spironolactone (ALDACTONE) 25 MG tablet Take 25 mg by mouth daily.    Marland Kitchen warfarin (COUMADIN) 5 MG tablet One daily for anticoagulation 90 tablet 3   No current facility-administered medications for this visit.    Allergies: Allergies  Allergen Reactions  . Penicillins Other (See Comments)    Temperature went up to 104 F  . Penicillin G Benzathine & Proc Other (See Comments)    Unknown    Social History: The patient  reports that she has never smoked. She has never used smokeless tobacco. She reports that she does not drink alcohol or use illicit drugs.   Family  History: The patient's family history includes Anuerysm in her sister; Cancer in her brother; Stroke in her mother.   Review of Systems: Please see the history of present illness.   All other systems are reviewed and negative.   Physical Exam: VS:  BP 100/80 mmHg  Pulse 80  Ht 5\' 3"  (1.6 m)  Wt 127 lb 1.9 oz (57.661 kg)  BMI 22.52 kg/m2  SpO2 99% .  BMI Body mass index is 22.52 kg/(m^2).   BP by me is 120/70.  Wt Readings from Last 3 Encounters:  04/22/15 127 lb 1.9 oz (57.661 kg)  04/12/15 125 lb (56.7 kg)  03/15/15 130 lb (58.968 kg)    General: Pleasant. Well developed, well nourished and in no acute distress.  HEENT: Normal. Neck: Supple, no JVD, carotid bruits, or masses noted.  Cardiac: Irregular rhythm. Her rate is ok. No murmurs, rubs, or gallops. Trace ankle edema.  Respiratory:  Lungs are clear to auscultation bilaterally with normal work of breathing.  GI: Soft and nontender.  MS: No deformity or atrophy. Gait and ROM intact. Skin: Warm and dry. Color is normal.  Neuro:  Strength and sensation are intact and no gross focal deficits noted.  Psych: Alert, appropriate and with normal affect.   LABORATORY DATA:  EKG:  EKG is not ordered today.   Lab Results  Component Value Date   WBC 7.3 07/14/2014   HGB 13.9 07/14/2014   HCT 40.5 07/14/2014   PLT 183.0 07/14/2014   GLUCOSE 177* 07/19/2014   CHOL 104 02/04/2015   TRIG 90 02/04/2015   HDL 33* 02/04/2015   LDLCALC 53 02/04/2015   ALT 15 02/04/2015   AST 22 02/04/2015   NA 141 02/04/2015   K 4.5 02/04/2015   CL 98 07/19/2014   CREATININE 1.2* 02/04/2015   BUN 37* 02/04/2015   CO2 30 07/19/2014   TSH 3.23 11/02/2013   INR 3.1* 02/04/2015   HGBA1C 8.1* 02/04/2015    BNP (last 3 results) No results for input(s): BNP in the last 8760 hours.  ProBNP (last 3 results) No results for input(s): PROBNP in the last 8760 hours.   Other Studies Reviewed Today:  Echo Study Conclusions from April 2014  -  Left ventricle: The cavity size was normal. Wall thickness was increased in a pattern of mild LVH. Systolic function was mildly to moderately reduced. The estimated ejection fraction was in the range of 40% to 45%. There is dyskinesis of the septal myocardium. The study is not technically sufficient to allow evaluation of LV diastolic function. - Aortic valve: A bioprosthesis was present. Trivial  regurgitation. - Mitral valve: Calcified annulus. Mildly thickened leaflets . Moderate regurgitation. - Left atrium: The atrium was severely dilated. - Right atrium: The atrium was severely dilated. - Tricuspid valve: Wide-open regurgitation. - Pulmonary arteries: Systolic pressure was mildly increased. PA peak pressure: 28mm Hg (S). - Pericardium, extracardiac: A trivial pericardial effusion was identified. There was a left pleural effusion.    Assessment / Plan: 1. PAF/flutter - with underlying PPM in place - followed by Dr. Rayann Heman.   2. HTN - BP is ok. No change with her current regimen.   3. Chronic coumadin  4. Prior AVR   5. Chronic LV dysfunction - fairly compensated - swelling is stable with her current regimen. Weight is stable. She is not short of breath.  6. Advanced age - doing very well.   Current medicines are reviewed with the patient today.  The patient does not have concerns regarding medicines other than what has been noted above.  The following changes have been made:  See above.  Labs/ tests ordered today include:   No orders of the defined types were placed in this encounter.     Disposition:   FU with me in 12 months.   Patient is agreeable to this plan and will call if any problems develop in the interim.   Signed: Burtis Junes, RN, ANP-C 04/22/2015 9:15 AM  [facility] [address] [place], [place]  [address] Phone: [phone] Fax: [phone]

## 2015-04-27 ENCOUNTER — Other Ambulatory Visit: Payer: Self-pay | Admitting: Internal Medicine

## 2015-05-05 DIAGNOSIS — H3532 Exudative age-related macular degeneration: Secondary | ICD-10-CM | POA: Diagnosis not present

## 2015-05-05 DIAGNOSIS — H3531 Nonexudative age-related macular degeneration: Secondary | ICD-10-CM | POA: Diagnosis not present

## 2015-05-05 DIAGNOSIS — Z961 Presence of intraocular lens: Secondary | ICD-10-CM | POA: Diagnosis not present

## 2015-05-09 ENCOUNTER — Telehealth: Payer: Self-pay | Admitting: Cardiology

## 2015-05-09 ENCOUNTER — Ambulatory Visit (INDEPENDENT_AMBULATORY_CARE_PROVIDER_SITE_OTHER): Payer: Medicare Other | Admitting: *Deleted

## 2015-05-09 ENCOUNTER — Encounter: Payer: Self-pay | Admitting: Internal Medicine

## 2015-05-09 ENCOUNTER — Telehealth: Payer: Self-pay | Admitting: Internal Medicine

## 2015-05-09 DIAGNOSIS — I495 Sick sinus syndrome: Secondary | ICD-10-CM

## 2015-05-09 NOTE — Telephone Encounter (Signed)
Spoke with pt and reminded pt of remote transmission that is due today. Pt verbalized understanding.   

## 2015-05-09 NOTE — Telephone Encounter (Signed)
New problem   Pt's daughter stated she won't be able to assist pt with remote transmission check til Wednesday. Please call if any question.

## 2015-05-10 ENCOUNTER — Non-Acute Institutional Stay: Payer: Medicare Other | Admitting: Internal Medicine

## 2015-05-10 ENCOUNTER — Encounter: Payer: Self-pay | Admitting: Internal Medicine

## 2015-05-10 VITALS — BP 120/68 | HR 64 | Temp 97.3°F | Wt 127.0 lb

## 2015-05-10 DIAGNOSIS — I482 Chronic atrial fibrillation, unspecified: Secondary | ICD-10-CM

## 2015-05-10 DIAGNOSIS — R609 Edema, unspecified: Secondary | ICD-10-CM | POA: Diagnosis not present

## 2015-05-10 DIAGNOSIS — Z7901 Long term (current) use of anticoagulants: Secondary | ICD-10-CM | POA: Diagnosis not present

## 2015-05-10 DIAGNOSIS — I509 Heart failure, unspecified: Secondary | ICD-10-CM | POA: Diagnosis not present

## 2015-05-10 DIAGNOSIS — T7840XA Allergy, unspecified, initial encounter: Secondary | ICD-10-CM

## 2015-05-10 DIAGNOSIS — I10 Essential (primary) hypertension: Secondary | ICD-10-CM

## 2015-05-10 LAB — POCT INR: INR: 2.5 — AB (ref 0.9–1.1)

## 2015-05-10 MED ORDER — CETIRIZINE HCL 10 MG PO TABS: ORAL_TABLET | ORAL | Status: DC

## 2015-05-10 NOTE — Progress Notes (Signed)
Patient ID: Nancy Payne, female   DOB: 14-Oct-1922, 81 y.o.   MRN: [id number]    [facility]     Place of Service: Clinic (12)     Allergies  Allergen Reactions  . Penicillins Other (See Comments)    Temperature went up to 104 F  . Penicillin G Benzathine & Proc Other (See Comments)    Unknown    Chief Complaint  Patient presents with  . Medical Management of Chronic Issues    coag check , takes 5mg  Warfarin daily.  Today's INR 2.5    HPI:  Long term current use of anticoagulant therapy: Therapeutic INR at 2.5  Edema: No wearing compression stockings regularly again  Essential hypertension: Controlled  Chronic congestive heart failure, unspecified congestive heart failure type: Compensated  Chronic atrial fibrillation: Rate controlled and anticoagulated with warfarin  Allergy, initial encounter: Reports allergy symptoms. These occur mainly indoors. They manifest as watery eyes, rhinorrhea, sneezing, and coughing. She has just started back on Zyrtec.    Medications: Patient's Medications  New Prescriptions   No medications on file  Previous Medications   FUROSEMIDE (LASIX) 40 MG TABLET    Take 1 tablet (40 mg total) by mouth daily.   GLIMEPIRIDE (AMARYL) 2 MG TABLET    Take 2 mg by mouth daily with breakfast.   METFORMIN (GLUCOPHAGE) 500 MG TABLET    One daily to control glucose   METOPROLOL TARTRATE (LOPRESSOR) 25 MG TABLET    Take 25 mg by mouth 2 (two) times daily.   METOPROLOL TARTRATE (LOPRESSOR) 25 MG TABLET    TAKE ONE TABLET BY MOUTH TWICE DAILY TO CONTROL BLOOD PRESSURE   MULTIPLE VITAMINS-MINERALS (MULTIVITAMIN PO)    Take 1 tablet by mouth daily.   ROSUVASTATIN (CRESTOR) 10 MG TABLET    Take 10 mg by mouth at bedtime.   SPIRONOLACTONE (ALDACTONE) 25 MG TABLET    Take 25 mg by mouth daily.   WARFARIN (COUMADIN) 5 MG TABLET    One daily for anticoagulation  Modified Medications   No medications on file  Discontinued Medications   No  medications on file     Review of Systems  Constitutional: Positive for activity change and fatigue. Negative for fever, chills, appetite change and unexpected weight change.  HENT: Negative for congestion, ear pain, mouth sores, sinus pressure and sore throat.        Allergic symptoms manifested by rhinorrhea, teary eyes, sneezing, coughing.  Respiratory: Negative for cough, chest tightness and shortness of breath.   Cardiovascular: Negative for chest pain, palpitations and leg swelling.       History of atrial fibrillation and aortic valvular stenosis. She is on chronic anticoagulation with warfarin for these problems.  Gastrointestinal: Negative for nausea, abdominal pain, diarrhea, constipation, blood in stool and abdominal distention.  Endocrine: Negative.   Genitourinary: Negative for dysuria, decreased urine volume and difficulty urinating.  Musculoskeletal: Positive for back pain and gait problem (Recent fall 03/07/2015). Negative for myalgias, joint swelling, neck pain and neck stiffness.  Skin:       Resolving large ecchymosis of the sacrum and left buttock due to a fall.  Allergic/Immunologic: Negative.   Neurological: Negative for dizziness, tremors, seizures, syncope, facial asymmetry, speech difficulty, weakness, light-headedness and headaches.  Hematological: Negative.   Psychiatric/Behavioral: Negative for confusion, sleep disturbance, self-injury, decreased concentration and agitation. The patient is not nervous/anxious and is not hyperactive.     Filed Vitals:   05/10/15 0902  BP: 120/68  Pulse: 64  Temp: 97.3 F (36.3 C)  TempSrc: Oral  Weight: 127 lb (57.607 kg)  SpO2: 99%   Body mass index is 22.5 kg/(m^2).  Physical Exam  Constitutional: She is oriented to person, place, and time. She appears well-developed. No distress.  HENT:  Head: Normocephalic and atraumatic.  Nose: Nose normal.  Eyes: Conjunctivae and EOM are normal. Pupils are equal, round, and  reactive to light. Left eye exhibits no discharge. No scleral icterus.  Neck: Normal range of motion. Neck supple. No JVD present. No tracheal deviation present. No thyromegaly present.  Cardiovascular: Exam reveals no friction rub.   Murmur heard. 3/6 LSB SEM. Pacemaker in left upper chest. AF.  Pulmonary/Chest: No respiratory distress. She has no wheezes. She has no rales.  Abdominal: Bowel sounds are normal. She exhibits no distension and no mass. There is no tenderness.  Musculoskeletal: Normal range of motion. She exhibits no edema or tenderness.  Edematous legs bilaterally.  Lymphadenopathy:    She has no cervical adenopathy.  Neurological: She is alert and oriented to person, place, and time. She has normal reflexes. No cranial nerve deficit. Coordination normal.  Skin: Skin is dry. No rash noted. No erythema. No pallor.  Resolving ecchymosis of the sacrum and left buttock extending down the left leg.  Psychiatric: She has a normal mood and affect. Her behavior is normal. Thought content normal.     Labs reviewed: Nursing Home on 05/10/2015  Component Date Value Ref Range Status  . INR 05/10/2015 2.5* 0.9 - 1.1 Final  Nursing Home on 03/15/2015  Component Date Value Ref Range Status  . INR 02/04/2015 3.1* 0.9 - 1.1 Final  . Glucose 02/04/2015 145   Final  . BUN 02/04/2015 37* 4 - 21 mg/dL Final  . Creatinine 02/04/2015 1.2* 0.5 - 1.1 mg/dL Final  . Potassium 02/04/2015 4.5  3.4 - 5.3 mmol/L Final  . Sodium 02/04/2015 141  137 - 147 mmol/L Final  . Triglycerides 02/04/2015 90  40 - 160 mg/dL Final  . Cholesterol 02/04/2015 104  0 - 200 mg/dL Final  . HDL 02/04/2015 33* 35 - 70 mg/dL Final  . LDL Cholesterol 02/04/2015 53   Final  . Alkaline Phosphatase 02/04/2015 82  25 - 125 U/L Final  . ALT 02/04/2015 15  7 - 35 U/L Final  . AST 02/04/2015 22  13 - 35 U/L Final  . Bilirubin, Total 02/04/2015 1.1   Final  . Hgb A1c MFr Bld 02/04/2015 8.1* 4.0 - 6.0 % Final      Assessment/Plan  1. Long term current use of anticoagulant therapy continue warfarin 5 mg daily  2. Edema Fairly well controlled with compression stockings  3. Essential hypertension Controlled  4. Chronic congestive heart failure, unspecified congestive heart failure type Compensated  5. Chronic atrial fibrillation Rate controlled and anticoagulated  6. Allergy, initial encounter Continue Zyrtec 10 mg daily

## 2015-05-11 DIAGNOSIS — I495 Sick sinus syndrome: Secondary | ICD-10-CM

## 2015-05-11 NOTE — Progress Notes (Signed)
Remote pacemaker transmission.   

## 2015-05-15 LAB — CUP PACEART REMOTE DEVICE CHECK
Battery Impedance: 100 Ohm
Brady Statistic AP VP Percent: 5 %
Brady Statistic AS VP Percent: 18 %
Lead Channel Impedance Value: 539 Ohm
Lead Channel Pacing Threshold Pulse Width: 0.4 ms
Lead Channel Sensing Intrinsic Amplitude: 5.6 mV
Lead Channel Setting Pacing Amplitude: 2.5 V
Lead Channel Setting Pacing Amplitude: 2.5 V
Lead Channel Setting Pacing Pulse Width: 0.4 ms
Lead Channel Setting Sensing Sensitivity: 2 mV
MDC IDC MSMT BATTERY REMAINING LONGEVITY: 153 mo
MDC IDC MSMT BATTERY VOLTAGE: 2.8 V
MDC IDC MSMT LEADCHNL RA IMPEDANCE VALUE: 415 Ohm
MDC IDC MSMT LEADCHNL RV PACING THRESHOLD AMPLITUDE: 0.875 V
MDC IDC SESS DTM: 20160608144429
MDC IDC STAT BRADY AP VS PERCENT: 0 %
MDC IDC STAT BRADY AS VS PERCENT: 77 %

## 2015-05-18 ENCOUNTER — Other Ambulatory Visit: Payer: Self-pay | Admitting: Internal Medicine

## 2015-05-24 ENCOUNTER — Encounter: Payer: Self-pay | Admitting: Cardiology

## 2015-06-12 ENCOUNTER — Other Ambulatory Visit: Payer: Self-pay | Admitting: Internal Medicine

## 2015-07-05 ENCOUNTER — Encounter: Payer: Self-pay | Admitting: Internal Medicine

## 2015-07-11 DIAGNOSIS — E785 Hyperlipidemia, unspecified: Secondary | ICD-10-CM | POA: Diagnosis not present

## 2015-07-11 DIAGNOSIS — E1165 Type 2 diabetes mellitus with hyperglycemia: Secondary | ICD-10-CM | POA: Diagnosis not present

## 2015-07-11 DIAGNOSIS — I10 Essential (primary) hypertension: Secondary | ICD-10-CM | POA: Diagnosis not present

## 2015-07-11 LAB — HEMOGLOBIN A1C: Hgb A1c MFr Bld: 8.4 % — AB (ref 4.0–6.0)

## 2015-07-11 LAB — BASIC METABOLIC PANEL
BUN: 35 mg/dL — AB (ref 4–21)
Creatinine: 1.2 mg/dL — AB (ref 0.5–1.1)
Glucose: 130 mg/dL
POTASSIUM: 4.8 mmol/L (ref 3.4–5.3)
SODIUM: 138 mmol/L (ref 137–147)

## 2015-07-11 LAB — LIPID PANEL
Cholesterol: 97 mg/dL (ref 0–200)
HDL: 38 mg/dL (ref 35–70)
LDL Cholesterol: 42 mg/dL
Triglycerides: 86 mg/dL (ref 40–160)

## 2015-07-11 LAB — HEPATIC FUNCTION PANEL
ALK PHOS: 66 U/L (ref 25–125)
ALT: 11 U/L (ref 7–35)
AST: 18 U/L (ref 13–35)
Bilirubin, Total: 0.8 mg/dL

## 2015-07-14 ENCOUNTER — Encounter: Payer: Self-pay | Admitting: *Deleted

## 2015-07-19 ENCOUNTER — Non-Acute Institutional Stay: Payer: Medicare Other | Admitting: Internal Medicine

## 2015-07-19 ENCOUNTER — Encounter: Payer: Self-pay | Admitting: Internal Medicine

## 2015-07-19 VITALS — BP 100/62 | HR 70 | Temp 97.6°F | Wt 126.0 lb

## 2015-07-19 DIAGNOSIS — E1129 Type 2 diabetes mellitus with other diabetic kidney complication: Secondary | ICD-10-CM | POA: Diagnosis not present

## 2015-07-19 DIAGNOSIS — R609 Edema, unspecified: Secondary | ICD-10-CM

## 2015-07-19 DIAGNOSIS — I10 Essential (primary) hypertension: Secondary | ICD-10-CM | POA: Diagnosis not present

## 2015-07-19 DIAGNOSIS — E785 Hyperlipidemia, unspecified: Secondary | ICD-10-CM

## 2015-07-19 DIAGNOSIS — IMO0002 Reserved for concepts with insufficient information to code with codable children: Secondary | ICD-10-CM

## 2015-07-19 DIAGNOSIS — E1165 Type 2 diabetes mellitus with hyperglycemia: Secondary | ICD-10-CM | POA: Diagnosis not present

## 2015-07-19 DIAGNOSIS — Z7901 Long term (current) use of anticoagulants: Secondary | ICD-10-CM | POA: Diagnosis not present

## 2015-07-19 DIAGNOSIS — N183 Chronic kidney disease, stage 3 unspecified: Secondary | ICD-10-CM

## 2015-07-19 DIAGNOSIS — I4891 Unspecified atrial fibrillation: Secondary | ICD-10-CM

## 2015-07-19 LAB — POCT INR: INR: 2.1 — AB (ref 0.9–1.1)

## 2015-07-19 NOTE — Progress Notes (Signed)
Patient ID: Nancy Payne, female   DOB: 28-Jul-1922, 81 y.o.   MRN: [id number]    [facility]     Place of Service: Clinic (12)     Allergies  Allergen Reactions  . Penicillins Other (See Comments)    Temperature went up to 104 F  . Penicillin G Benzathine & Proc Other (See Comments)    Unknown    Chief Complaint  Patient presents with  . Medical Management of Chronic Issues    blood sugar, A-Fib, CHF, coag check INR 2.1    HPI:  Atrial fibrillation, unspecified - therapeutic INR at 2.1. Controlled rate.  DM type 2, uncontrolled, with renal complications: Hemoglobin A1c 8.7, but fasting glucose only 130 mg percent. She denies polyuria or polyphagia. She does have some increased thirst. Complains of dry mouth.  Edema: [place] 1-2+. Unchanged.  Hyperlipidemia: Controlled  Essential hypertension: Controlled. Denies dizziness.  Kidney disease, chronic, stage III (GFR 30-59 ml/min): Unchanged  Long term current use of anticoagulant therapy: Used for atrial fibrillation. Currently in a therapeutic range.    Medications: Patient's Medications  New Prescriptions   No medications on file  Previous Medications   CETIRIZINE (ZYRTEC) 10 MG TABLET    1 tablet daily to help control allergies   FUROSEMIDE (LASIX) 40 MG TABLET    Take 1 tablet (40 mg total) by mouth daily.   GLIMEPIRIDE (AMARYL) 2 MG TABLET    TAKE ONE TABLET BY MOUTH ONCE DAILY WITH BREAKFAST TO CONTROL BLOOD SUGAR   METFORMIN (GLUCOPHAGE) 500 MG TABLET    One daily to control glucose   METOPROLOL TARTRATE (LOPRESSOR) 25 MG TABLET    TAKE ONE TABLET BY MOUTH TWICE DAILY TO CONTROL BLOOD PRESSURE   MULTIPLE VITAMINS-MINERALS (MULTIVITAMIN PO)    Take 1 tablet by mouth daily.   ROSUVASTATIN (CRESTOR) 10 MG TABLET    Take 10 mg by mouth at bedtime.   SPIRONOLACTONE (ALDACTONE) 25 MG TABLET    Take 25 mg by mouth daily.   WARFARIN (COUMADIN) 5 MG TABLET    One daily for anticoagulation  Modified  Medications   No medications on file  Discontinued Medications   CRESTOR 10 MG TABLET    TAKE ONE TABLET BY MOUTH ONCE DAILY TO CONTROL CHOLESTEROL   GLIMEPIRIDE (AMARYL) 2 MG TABLET    Take 2 mg by mouth daily with breakfast.   METOPROLOL TARTRATE (LOPRESSOR) 25 MG TABLET    Take 25 mg by mouth 2 (two) times daily.     Review of Systems  Constitutional: Positive for activity change and fatigue. Negative for fever, chills, appetite change and unexpected weight change.  HENT: Negative for congestion, ear pain, mouth sores, sinus pressure and sore throat.        Hx of allergic symptoms manifested by rhinorrhea, teary eyes, sneezing, coughing.  Respiratory: Negative for cough, chest tightness and shortness of breath.   Cardiovascular: Negative for chest pain, palpitations and leg swelling.       History of atrial fibrillation and aortic valvular stenosis. She is on chronic anticoagulation with warfarin for these problems.  Gastrointestinal: Negative for nausea, abdominal pain, diarrhea, constipation, blood in stool and abdominal distention.  Endocrine: Negative.   Genitourinary: Negative for dysuria, decreased urine volume and difficulty urinating.  Musculoskeletal: Positive for back pain and gait problem (Recent fall 03/07/2015). Negative for myalgias, joint swelling, neck pain and neck stiffness.  Allergic/Immunologic: Negative.   Neurological: Negative for dizziness, tremors, seizures, syncope, facial asymmetry, speech  difficulty, weakness, light-headedness and headaches.  Hematological: Negative.   Psychiatric/Behavioral: Negative for confusion, sleep disturbance, self-injury, decreased concentration and agitation. The patient is not nervous/anxious and is not hyperactive.     Filed Vitals:   07/19/15 1150  BP: 100/62  Pulse: 70  Temp: 97.6 F (36.4 C)  TempSrc: Oral  Weight: 126 lb (57.153 kg)  SpO2: 99%   Body mass index is 22.33 kg/(m^2).  Physical Exam  Constitutional: She  is oriented to person, place, and time. She appears well-developed. No distress.  HENT:  Head: Normocephalic and atraumatic.  Nose: Nose normal.  Xerostomia  Eyes: Conjunctivae and EOM are normal. Pupils are equal, round, and reactive to light. Left eye exhibits no discharge. No scleral icterus.  Xerophthalmia  Neck: Normal range of motion. Neck supple. No JVD present. No tracheal deviation present. No thyromegaly present.  Cardiovascular: Exam reveals no friction rub.   Murmur heard. 3/6 LSB SEM. Pacemaker in left upper chest. AF.  Pulmonary/Chest: No respiratory distress. She has no wheezes. She has no rales.  Abdominal: Bowel sounds are normal. She exhibits no distension and no mass. There is no tenderness.  Musculoskeletal: Normal range of motion. She exhibits no edema or tenderness.  Edematous legs bilaterally.  Lymphadenopathy:    She has no cervical adenopathy.  Neurological: She is alert and oriented to person, place, and time. She has normal reflexes. No cranial nerve deficit. Coordination normal.  Skin: Skin is dry. No rash noted. No erythema. No pallor.  Psychiatric: She has a normal mood and affect. Her behavior is normal. Thought content normal.     Labs reviewed: Nursing Home on 07/19/2015  Component Date Value Ref Range Status  . INR 07/19/2015 2.1* 0.9 - 1.1 Final  Abstract on 07/14/2015  Component Date Value Ref Range Status  . Glucose 07/11/2015 130   Final  . BUN 07/11/2015 35* 4 - 21 mg/dL Final  . Creatinine 07/11/2015 1.2* 0.5 - 1.1 mg/dL Final  . Potassium 07/11/2015 4.8  3.4 - 5.3 mmol/L Final  . Sodium 07/11/2015 138  137 - 147 mmol/L Final  . Triglycerides 07/11/2015 86  40 - 160 mg/dL Final  . Cholesterol 07/11/2015 97  0 - 200 mg/dL Final  . HDL 07/11/2015 38  35 - 70 mg/dL Final  . LDL Cholesterol 07/11/2015 42   Final  . Alkaline Phosphatase 07/11/2015 66  25 - 125 U/L Final  . ALT 07/11/2015 11  7 - 35 U/L Final  . AST 07/11/2015 18  13 - 35 U/L  Final  . Bilirubin, Total 07/11/2015 0.8   Final  . Hgb A1c MFr Bld 07/11/2015 8.4* 4.0 - 6.0 % Final  Nursing Home on 05/10/2015  Component Date Value Ref Range Status  . INR 05/10/2015 2.5* 0.9 - 1.1 Final  Clinical Support on 05/09/2015  Component Date Value Ref Range Status  . Date Time Interrogation Session 05/09/2015 [id number]   Preliminary  . Pulse Generator Manufacturer 05/09/2015 MERM   Preliminary  . Pulse Gen Model 05/09/2015 ADDRL1 Adapta   Preliminary  . Pulse Gen Serial Number 05/09/2015 [id number]   Preliminary  . Implantable Pulse Generator Type 05/09/2015 Implantable Pulse Generator   Preliminary  . Implantable Pulse Generator Implan* 05/09/2015 [id number]   Preliminary  . Lead Channel Setting Sensing Sensi* 05/09/2015 2   Preliminary  . Lead Channel Setting Pacing Amplit* 05/09/2015 2.5   Preliminary  . Lead Channel Setting Pacing Pulse * 05/09/2015 0.4   Preliminary  . Lead Channel  Setting Pacing Amplit* 05/09/2015 2.5   Preliminary  . Lead Channel Impedance Value 05/09/2015 415   Preliminary  . Lead Channel Impedance Value 05/09/2015 539   Preliminary  . Lead Channel Sensing Intrinsic Amp* 05/09/2015 5.6   Preliminary  . Lead Channel Pacing Threshold Ampl* 05/09/2015 0.875   Preliminary  . Lead Channel Pacing Threshold Puls* 05/09/2015 0.4   Preliminary  . Battery Status 05/09/2015 Unknown   Preliminary  . Battery Remaining Longevity 05/09/2015 153   Preliminary  . Battery Voltage 05/09/2015 2.8   Preliminary  . Battery Impedance 05/09/2015 100   Preliminary  . Loletha Grayer Statistic AP VP Percent 05/09/2015 5   Preliminary  . Loletha Grayer Statistic AS VP Percent 05/09/2015 18   Preliminary  . Loletha Grayer Statistic AP VS Percent 05/09/2015 0   Preliminary  . Loletha Grayer Statistic AS VS Percent 05/09/2015 77   Preliminary  . Eval Rhythm 05/09/2015 AFL/Vp   Preliminary     Assessment/Plan  1. Atrial fibrillation, unspecified Controlled rate. INR therapeutic.  2. DM type  2, uncontrolled, with renal complications Elevated hemoglobin A1c. Patient is feeling well. I hesitate to increase oral medications. She is not ready to go on insulin.  3. Edema Chronic. Unchanged since previous visits.  4. Hyperlipidemia Controlled  5. Essential hypertension Controlled  6. Kidney disease, chronic, stage III (GFR 30-59 ml/min) Stable  7. Long term current use of anticoagulant therapy Therapeutic INR today

## 2015-07-25 ENCOUNTER — Other Ambulatory Visit: Payer: Self-pay | Admitting: Internal Medicine

## 2015-07-28 DIAGNOSIS — H3531 Nonexudative age-related macular degeneration: Secondary | ICD-10-CM | POA: Diagnosis not present

## 2015-07-28 DIAGNOSIS — H3532 Exudative age-related macular degeneration: Secondary | ICD-10-CM | POA: Diagnosis not present

## 2015-07-28 DIAGNOSIS — Z961 Presence of intraocular lens: Secondary | ICD-10-CM | POA: Diagnosis not present

## 2015-08-01 ENCOUNTER — Encounter: Payer: Self-pay | Admitting: Internal Medicine

## 2015-08-05 DIAGNOSIS — E1159 Type 2 diabetes mellitus with other circulatory complications: Secondary | ICD-10-CM | POA: Diagnosis not present

## 2015-08-05 DIAGNOSIS — L84 Corns and callosities: Secondary | ICD-10-CM | POA: Diagnosis not present

## 2015-08-05 DIAGNOSIS — L602 Onychogryphosis: Secondary | ICD-10-CM | POA: Diagnosis not present

## 2015-08-11 ENCOUNTER — Encounter: Payer: Self-pay | Admitting: Internal Medicine

## 2015-08-11 ENCOUNTER — Ambulatory Visit (INDEPENDENT_AMBULATORY_CARE_PROVIDER_SITE_OTHER): Payer: Medicare Other | Admitting: *Deleted

## 2015-08-11 DIAGNOSIS — I495 Sick sinus syndrome: Secondary | ICD-10-CM

## 2015-08-11 NOTE — Progress Notes (Signed)
Remote pacemaker transmission.   

## 2015-08-24 LAB — CUP PACEART REMOTE DEVICE CHECK
Battery Voltage: 2.8 V
Brady Statistic AP VP Percent: 20.5 %
Brady Statistic AS VP Percent: 12.4 %
Brady Statistic AS VS Percent: 66.9 %
Date Time Interrogation Session: 20160921131744
Lead Channel Impedance Value: 517 Ohm
Lead Channel Sensing Intrinsic Amplitude: 5.6 mV
Lead Channel Setting Pacing Amplitude: 2.5 V
Lead Channel Setting Pacing Amplitude: 2.5 V
Lead Channel Setting Pacing Pulse Width: 0.4 ms
MDC IDC MSMT BATTERY IMPEDANCE: 100 Ohm
MDC IDC MSMT LEADCHNL RA IMPEDANCE VALUE: 438 Ohm
MDC IDC MSMT LEADCHNL RV PACING THRESHOLD AMPLITUDE: 0.875 V
MDC IDC MSMT LEADCHNL RV PACING THRESHOLD PULSEWIDTH: 0.4 ms
MDC IDC SET LEADCHNL RV SENSING SENSITIVITY: 2 mV
MDC IDC STAT BRADY AP VS PERCENT: 0.2 %

## 2015-08-26 ENCOUNTER — Other Ambulatory Visit: Payer: Self-pay | Admitting: Internal Medicine

## 2015-08-30 ENCOUNTER — Encounter: Payer: Self-pay | Admitting: Cardiology

## 2015-09-01 DIAGNOSIS — Z23 Encounter for immunization: Secondary | ICD-10-CM | POA: Diagnosis not present

## 2015-09-22 DIAGNOSIS — H353211 Exudative age-related macular degeneration, right eye, with active choroidal neovascularization: Secondary | ICD-10-CM | POA: Diagnosis not present

## 2015-10-31 ENCOUNTER — Other Ambulatory Visit: Payer: Self-pay | Admitting: Internal Medicine

## 2015-11-08 NOTE — Progress Notes (Signed)
Electrophysiology Office Note Date: 11/09/2015  ID:  Nancy Payne, DOB 29-Feb-1922, MRN [id number]  PCP: Estill Dooms, MD Primary Cardiologist: Truitt Merle, NP Electrophysiologist: Rayann Heman  CC: Pacemaker follow-up  Nancy Payne is a 81 y.o. female seen today for Dr Rayann Heman.  She presents today for routine electrophysiology followup.  Since last being seen in our clinic, the patient reports doing reasonably well.  She has had some sinus congestion as well as increased LE edema.  She denies chest pain, palpitations, dyspnea, PND, orthopnea, nausea, vomiting, dizziness, syncope, weight gain, or early satiety.  Device History: MDT dual chamber PPM implanted 2005 for SSS; gen change 07/2014   Past Medical History  Diagnosis Date  . Sick sinus syndrome ([place])     post DDD pacemaker  . Hypertension   . Edema   . Type II or unspecified type diabetes mellitus with renal manifestations, not stated as uncontrolled     type 2  . Hyperlipidemia   . Atrial fibrillation ([place])     persistent afib/ atrial flutter  . Macular degeneration 2012    left   . Skin cancer of nose 2012    with skin graft  . Kidney disease, chronic, stage III (GFR 30-59 ml/min)   . Eczema   . Xerostomia 2012  . Xerophthalmia 2012  . Brain atrophy 2009  . Congestive heart failure (CHF) [facility])    Past Surgical History  Procedure Laterality Date  . Pacemaker insertion  2005; 07-19-14    MDT ADDRL1 pacemaker generator change by Dr Rayann Heman [phone]  . Cardiac valve surgery  2005    Dr. Ricard Dillon  . Ankle surgery Right 1990    fracture as steel plate   . Knee surgery Right 1990    fracture has plate and rod in femur  . Colonoscopy  2008  . Pacemaker generator change  07/19/2014    Dr. Rayann Heman MDT  . Permanent pacemaker generator change N/A 07/19/2014    Procedure: PERMANENT PACEMAKER GENERATOR CHANGE;  Surgeon: Coralyn Mark, MD;  Location: [facility] CATH [facility];  Service: Cardiovascular;  Laterality: N/A;  . Cataract  extraction w/ intraocular lens implant Left 11/11/14    Dibble surgery      Current Outpatient Prescriptions  Medication Sig Dispense Refill  . furosemide (LASIX) 40 MG tablet TAKE 1 TABLET (40 MG TOTAL) BY MOUTH DAILY. 90 tablet 0  . glimepiride (AMARYL) 2 MG tablet TAKE ONE TABLET BY MOUTH ONCE DAILY WITH BREAKFAST TO CONTROL BLOOD SUGAR 90 tablet 3  . metFORMIN (GLUCOPHAGE) 500 MG tablet One daily to control glucose (Patient taking differently: One daily to control glucose with dinner) 90 tablet 3  . metoprolol tartrate (LOPRESSOR) 25 MG tablet TAKE ONE TABLET BY MOUTH TWICE DAILY TO CONTROL BLOOD PRESSURE 180 tablet 3  . Multiple Vitamins-Minerals (MULTIVITAMIN PO) Take 1 tablet by mouth daily.    . rosuvastatin (CRESTOR) 10 MG tablet Take 10 mg by mouth at bedtime.    Marland Kitchen spironolactone (ALDACTONE) 25 MG tablet Take 25 mg by mouth daily.    Marland Kitchen warfarin (COUMADIN) 5 MG tablet One daily for anticoagulation 90 tablet 3   No current facility-administered medications for this visit.    Allergies:   Penicillins and Penicillin g benzathine & proc   Social History: Social History   Social History  . Marital Status: Widowed    Spouse Name: Marcello Moores  . Number of Children: N/A  . Years of Education: N/A  Occupational History  . retired Personal assistant    Social History Main Topics  . Smoking status: Never Smoker   . Smokeless tobacco: Never Used  . Alcohol Use: No  . Drug Use: No  . Sexual Activity: No   Other Topics Concern  . Not on file   Social History Narrative   Moved to [facility] 08/30/2012   Widowed 2013   Walks with walker   Exercise walking          Family History: Family History  Problem Relation Age of Onset  . Stroke Mother   . Cancer Brother   . Anuerysm Sister      Review of Systems: All other systems reviewed and are otherwise negative except as noted above.   Physical Exam: VS:  BP 140/75 mmHg  Pulse 76  Ht 5\' 3"  (1.6 m)   Wt 129 lb (58.514 kg)  BMI 22.86 kg/m2  SpO2 96% , BMI Body mass index is 22.86 kg/(m^2).  GEN- The patient is elderly appearing, alert and oriented x 3 today.   HEENT: normocephalic, atraumatic; sclera clear, conjunctiva pink; hearing intact; oropharynx clear; neck supple Lungs- Clear to ausculation bilaterally, normal work of breathing.  No wheezes, rales, rhonchi Heart- Irregular rate and rhythm GI- soft, non-tender, non-distended, bowel sounds present Extremities- no clubbing, cyanosis, 2+ BLE pitting edema; DP/PT/radial pulses 2+ bilaterally MS- no significant deformity or atrophy Skin- warm and dry, no rash or lesion; PPM pocket well healed Psych- euthymic mood, full affect Neuro- strength and sensation are intact  PPM Interrogation- reviewed in detail today,  See PACEART report  EKG:  EKG is not ordered today.  Recent Labs: 07/11/2015: ALT 11; BUN 35*; Creatinine 1.2*; Potassium 4.8; Sodium 138   Wt Readings from Last 3 Encounters:  11/09/15 129 lb (58.514 kg)  07/19/15 126 lb (57.153 kg)  05/10/15 127 lb (57.607 kg)     Other studies Reviewed: Additional studies/ records that were reviewed today include: Dr Jackalyn Lombard office notes  Assessment and Plan:  1.  Sick sinus syndrome Normal PPM function See Pace Art report No changes today  2.  Persistent atrial fibrillation/atrial flutter Asymptomatic V rates controlled Continue Warfarin for CHADS2VASC of 5  3.  HTN Stable No change required today  4.  Chronic systolic heart failure Slightly volume overloaded on exam Increase Lasix to 60mg  x3 days BMET today May need to continue with increased Lasix dose depending on BMET.  Follow up with Dr Nyoka Cowden as scheduled later this month.   Current medicines are reviewed at length with the patient today.   The patient does not have concerns regarding her medicines.  The following changes were made today:  none  Labs/ tests ordered today include: BMET  Disposition:    Follow up with [facility] transmissions, [place] as scheduled, Dr Rayann Heman 1 year     Signed, Chanetta Marshall, NP 11/09/2015 12:17 PM  [place] [address] [phone] (office) [phone] (fax)

## 2015-11-09 ENCOUNTER — Encounter: Payer: Self-pay | Admitting: Nurse Practitioner

## 2015-11-09 ENCOUNTER — Encounter: Payer: Medicare Other | Admitting: Nurse Practitioner

## 2015-11-09 ENCOUNTER — Ambulatory Visit (INDEPENDENT_AMBULATORY_CARE_PROVIDER_SITE_OTHER): Payer: Medicare Other | Admitting: Nurse Practitioner

## 2015-11-09 VITALS — BP 140/75 | HR 76 | Ht 63.0 in | Wt 129.0 lb

## 2015-11-09 DIAGNOSIS — I5022 Chronic systolic (congestive) heart failure: Secondary | ICD-10-CM | POA: Diagnosis not present

## 2015-11-09 DIAGNOSIS — I495 Sick sinus syndrome: Secondary | ICD-10-CM | POA: Diagnosis not present

## 2015-11-09 DIAGNOSIS — I481 Persistent atrial fibrillation: Secondary | ICD-10-CM | POA: Diagnosis not present

## 2015-11-09 DIAGNOSIS — I10 Essential (primary) hypertension: Secondary | ICD-10-CM

## 2015-11-09 DIAGNOSIS — I4819 Other persistent atrial fibrillation: Secondary | ICD-10-CM

## 2015-11-09 LAB — BASIC METABOLIC PANEL
BUN: 32 mg/dL — ABNORMAL HIGH (ref 7–25)
CALCIUM: 9.3 mg/dL (ref 8.6–10.4)
CO2: 26 mmol/L (ref 20–31)
CREATININE: 1.26 mg/dL — AB (ref 0.60–0.88)
Chloride: 99 mmol/L (ref 98–110)
GLUCOSE: 230 mg/dL — AB (ref 65–99)
Potassium: 4.4 mmol/L (ref 3.5–5.3)
SODIUM: 137 mmol/L (ref 135–146)

## 2015-11-09 NOTE — Patient Instructions (Addendum)
Medication Instructions:   TAKE LASIX 60 MG FOR 3 DAYS THEN RESUME BACK TO ORIGINAL DOSAGE    If you need a refill on your cardiac medications before your next appointment, please call your pharmacy.  Labwork: BMET    Testing/Procedures:  NONE ORDER TODAY     Follow-Up:  Your physician wants you to follow-up in: [place] will receive a reminder letter in the mail two months in advance. If you don't receive a letter, please call our office to schedule the follow-up appointment.  Remote monitoring is used to monitor your Pacemaker of ICD from home. This monitoring reduces the number of office visits required to check your device to one time per year. It allows [place] to keep an eye on the functioning of your device to ensure it is working properly. You are scheduled for a device check from home on You may send your transmission at any time that day. If you have a wireless device, the transmission will be sent automatically. After your physician reviews your transmission, you will receive a postcard with your next transmission date.     Any Other Special Instructions Will Be Listed Below (If Applicable).

## 2015-11-11 ENCOUNTER — Telehealth: Payer: Self-pay | Admitting: Internal Medicine

## 2015-11-11 DIAGNOSIS — I5022 Chronic systolic (congestive) heart failure: Secondary | ICD-10-CM

## 2015-11-11 NOTE — Telephone Encounter (Signed)
°  New Prob    Pt is calling to update on pts status. Please call.

## 2015-11-14 ENCOUNTER — Encounter: Payer: Self-pay | Admitting: *Deleted

## 2015-11-14 DIAGNOSIS — I10 Essential (primary) hypertension: Secondary | ICD-10-CM | POA: Diagnosis not present

## 2015-11-14 DIAGNOSIS — E1165 Type 2 diabetes mellitus with hyperglycemia: Secondary | ICD-10-CM | POA: Diagnosis not present

## 2015-11-14 LAB — HEMOGLOBIN A1C: Hgb A1c MFr Bld: 8.3 % — AB (ref 4.0–6.0)

## 2015-11-14 LAB — BASIC METABOLIC PANEL
BUN: 33 mg/dL — AB (ref 4–21)
Creatinine: 1.3 mg/dL — AB (ref 0.5–1.1)
GLUCOSE: 121 mg/dL
POTASSIUM: 4.7 mmol/L (ref 3.4–5.3)
SODIUM: 141 mmol/L (ref 137–147)

## 2015-11-14 LAB — HEPATIC FUNCTION PANEL
ALK PHOS: 64 U/L (ref 25–125)
ALT: 13 U/L (ref 7–35)
AST: 21 U/L (ref 13–35)
Bilirubin, Total: 1 mg/dL

## 2015-11-15 MED ORDER — FUROSEMIDE 40 MG PO TABS: 60.0000 mg | ORAL_TABLET | Freq: Every day | ORAL | Status: DC

## 2015-11-15 NOTE — Telephone Encounter (Signed)
Spoke with pt and reviewed lab results and recommendations from Chanetta Marshall, NP with her.  Pt reports swelling improved on Lasix 60 mg daily.  I instructed pt to continue this dose.  She will come in for lab work on 12/22.  Will send new prescription to [facility].

## 2015-11-15 NOTE — Telephone Encounter (Signed)
F/u    Pt stating she is returning [facility] call.

## 2015-11-16 ENCOUNTER — Encounter: Payer: Medicare Other | Admitting: Nurse Practitioner

## 2015-11-17 DIAGNOSIS — H353211 Exudative age-related macular degeneration, right eye, with active choroidal neovascularization: Secondary | ICD-10-CM | POA: Diagnosis not present

## 2015-11-18 DIAGNOSIS — E1159 Type 2 diabetes mellitus with other circulatory complications: Secondary | ICD-10-CM | POA: Diagnosis not present

## 2015-11-18 DIAGNOSIS — L84 Corns and callosities: Secondary | ICD-10-CM | POA: Diagnosis not present

## 2015-11-18 DIAGNOSIS — L602 Onychogryphosis: Secondary | ICD-10-CM | POA: Diagnosis not present

## 2015-11-22 ENCOUNTER — Non-Acute Institutional Stay: Payer: Medicare Other | Admitting: Internal Medicine

## 2015-11-22 ENCOUNTER — Encounter: Payer: Self-pay | Admitting: Internal Medicine

## 2015-11-22 VITALS — BP 116/62 | HR 68 | Temp 97.3°F | Ht 63.0 in | Wt 129.0 lb

## 2015-11-22 DIAGNOSIS — E785 Hyperlipidemia, unspecified: Secondary | ICD-10-CM

## 2015-11-22 DIAGNOSIS — E1122 Type 2 diabetes mellitus with diabetic chronic kidney disease: Secondary | ICD-10-CM | POA: Diagnosis not present

## 2015-11-22 DIAGNOSIS — E1165 Type 2 diabetes mellitus with hyperglycemia: Secondary | ICD-10-CM | POA: Diagnosis not present

## 2015-11-22 DIAGNOSIS — N183 Chronic kidney disease, stage 3 unspecified: Secondary | ICD-10-CM

## 2015-11-22 DIAGNOSIS — I10 Essential (primary) hypertension: Secondary | ICD-10-CM

## 2015-11-22 DIAGNOSIS — I5022 Chronic systolic (congestive) heart failure: Secondary | ICD-10-CM

## 2015-11-22 DIAGNOSIS — N182 Chronic kidney disease, stage 2 (mild): Secondary | ICD-10-CM

## 2015-11-22 DIAGNOSIS — Z7901 Long term (current) use of anticoagulants: Secondary | ICD-10-CM | POA: Diagnosis not present

## 2015-11-22 DIAGNOSIS — R609 Edema, unspecified: Secondary | ICD-10-CM

## 2015-11-22 DIAGNOSIS — I480 Paroxysmal atrial fibrillation: Secondary | ICD-10-CM | POA: Diagnosis not present

## 2015-11-22 DIAGNOSIS — IMO0002 Reserved for concepts with insufficient information to code with codable children: Secondary | ICD-10-CM

## 2015-11-22 LAB — POCT INR: INR: 3 — AB (ref 0.9–1.1)

## 2015-11-22 NOTE — Progress Notes (Signed)
Patient ID: Nancy Payne, female   DOB: May 05, 1922, 81 y.o.   MRN: [id number]    [facility]     Place of Service: Clinic (12)     Allergies  Allergen Reactions  . Penicillins Other (See Comments)    Temperature went up to 104 F  . Penicillin G Benzathine & Proc Other (See Comments)    Unknown    Chief Complaint  Patient presents with  . Medical Management of Chronic Issues    A-Fib, blood sugar, blood pressure, CKC. INR 3.0    HPI:  Saw  Chanetta Marshall, NP with Dr. Rayann Heman. Increased furosemide to 60 mg 11/09/15 due to increasing edema.  Paroxysmal atrial fibrillation (HCC) - rate controlled and anticoagiulated  Chronic systolic congestive heart failure ([place]) - compensated  Uncontrolled type 2 diabetes mellitus with stage 2 chronic kidney disease, without long-term current use of insulin (HCC) - high A1c on the last check. Cheating on diet due to holidays.  Edema, unspecified type - stable. Wearing compression staockings.  Hyperlipidemia - needs recheck  Essential hypertension - controlled  Long term current use of anticoagulant therapy - therapeutic INR  Kidney disease, chronic, stage III (GFR 30-59 ml/min) - unchanged    Medications: Patient's Medications  New Prescriptions   No medications on file  Previous Medications   FUROSEMIDE (LASIX) 40 MG TABLET    Take 1.5 tablets (60 mg total) by mouth daily.   GLIMEPIRIDE (AMARYL) 2 MG TABLET    TAKE ONE TABLET BY MOUTH ONCE DAILY WITH BREAKFAST TO CONTROL BLOOD SUGAR   METFORMIN (GLUCOPHAGE) 500 MG TABLET    One daily to control glucose   METOPROLOL TARTRATE (LOPRESSOR) 25 MG TABLET    TAKE ONE TABLET BY MOUTH TWICE DAILY TO CONTROL BLOOD PRESSURE   MULTIPLE VITAMINS-MINERALS (MULTIVITAMIN PO)    Take 1 tablet by mouth daily.   ROSUVASTATIN (CRESTOR) 10 MG TABLET    Take 10 mg by mouth at bedtime.   SPIRONOLACTONE (ALDACTONE) 25 MG TABLET    Take 25 mg by mouth daily.   WARFARIN (COUMADIN) 5 MG  TABLET    One daily for anticoagulation  Modified Medications   No medications on file  Discontinued Medications   No medications on file     Review of Systems  Constitutional: Positive for activity change and fatigue. Negative for fever, chills, appetite change and unexpected weight change.  HENT: Negative for congestion, ear pain, mouth sores, sinus pressure and sore throat.        Hx of allergic symptoms manifested by rhinorrhea, teary eyes, sneezing, coughing.  Respiratory: Negative for cough, chest tightness and shortness of breath.   Cardiovascular: Negative for chest pain, palpitations and leg swelling.       History of atrial fibrillation and aortic valvular stenosis. She is on chronic anticoagulation with warfarin for these problems.  Gastrointestinal: Negative for nausea, abdominal pain, diarrhea, constipation, blood in stool and abdominal distention.  Endocrine: Negative.   Genitourinary: Negative for dysuria, decreased urine volume and difficulty urinating.  Musculoskeletal: Positive for back pain and gait problem (Recent fall 03/07/2015). Negative for myalgias, joint swelling, neck pain and neck stiffness.  Allergic/Immunologic: Negative.   Neurological: Negative for dizziness, tremors, seizures, syncope, facial asymmetry, speech difficulty, weakness, light-headedness and headaches.  Hematological: Negative.   Psychiatric/Behavioral: Negative for confusion, sleep disturbance, self-injury, decreased concentration and agitation. The patient is not nervous/anxious and is not hyperactive.     Filed Vitals:   11/22/15 0930  BP:  116/62  Pulse: 68  Temp: 97.3 F (36.3 C)  TempSrc: Oral  Height: '5\' 3"'  (1.6 m)  Weight: 129 lb (58.514 kg)  SpO2: 96%   Body mass index is 22.86 kg/(m^2).  Physical Exam  Constitutional: She is oriented to person, place, and time. She appears well-developed. No distress.  HENT:  Head: Normocephalic and atraumatic.  Nose: Nose normal.    Xerostomia  Eyes: Conjunctivae and EOM are normal. Pupils are equal, round, and reactive to light. Left eye exhibits no discharge. No scleral icterus.  Xerophthalmia  Neck: Normal range of motion. Neck supple. No JVD present. No tracheal deviation present. No thyromegaly present.  Cardiovascular: Exam reveals no friction rub.   Murmur heard. 3/6 LSB SEM. Pacemaker in left upper chest. AF.  Pulmonary/Chest: No respiratory distress. She has no wheezes. She has no rales.  Abdominal: Bowel sounds are normal. She exhibits no distension and no mass. There is no tenderness.  Musculoskeletal: Normal range of motion. She exhibits no edema or tenderness.  Edematous legs bilaterally.  Lymphadenopathy:    She has no cervical adenopathy.  Neurological: She is alert and oriented to person, place, and time. She has normal reflexes. No cranial nerve deficit. Coordination normal.  Skin: Skin is dry. No rash noted. No erythema. No pallor.  Psychiatric: She has a normal mood and affect. Her behavior is normal. Thought content normal.     Labs reviewed: Lab Summary Latest Ref Rng 11/14/2015 11/09/2015 07/11/2015 02/04/2015  Hemoglobin 13.0-17.0 g/dL (None) (None) (None) (None)  Hematocrit 39.0-52.0 % (None) (None) (None) (None)  White count - (None) (None) (None) (None)  Platelet count - (None) (None) (None) (None)  Sodium 137 - 147 mmol/L 141 137 138 141  Potassium 3.4 - 5.3 mmol/L 4.7 4.4 4.8 4.5  Calcium 8.6 - 10.4 mg/dL (None) 9.3 (None) (None)  Phosphorus - (None) (None) (None) (None)  Creatinine 0.5 - 1.1 mg/dL 1.3(A) 1.26(H) 1.2(A) 1.2(A)  AST 13 - 35 U/L 21 (None) 18 22  Alk Phos 25 - 125 U/L 64 (None) 66 82  Bilirubin - (None) (None) (None) (None)  Glucose - 121 230(H) 130 145  Cholesterol 0 - 200 mg/dL (None) (None) 97 104  HDL cholesterol 35 - 70 mg/dL (None) (None) 38 33(A)  Triglycerides 40 - 160 mg/dL (None) (None) 86 90  LDL Direct - (None) (None) (None) (None)  LDL Calc - (None) (None)  42 53  Total protein - (None) (None) (None) (None)  Albumin - (None) (None) (None) (None)   Lab Results  Component Value Date   TSH 3.23 11/02/2013   Lab Results  Component Value Date   BUN 33* 11/14/2015   Lab Results  Component Value Date   HGBA1C 8.3* 11/14/2015       Assessment/Plan  1. Paroxysmal atrial fibrillation (HCC) Currently in NSR  2. Chronic systolic congestive heart failure (HCC) compensated  3. Uncontrolled type 2 diabetes mellitus with stage 2 chronic kidney disease, without long-term current use of insulin ([place]) Follow diet  4. Edema, unspecified type improved  5. Hyperlipidemia -lipids, future  6. Essential hypertension -cmp, future  7. Long term current use of anticoagulant therapy INR 3.0 today  8. Kidney disease, chronic, stage III (GFR 30-59 ml/min) stable

## 2015-11-22 NOTE — Addendum Note (Signed)
Addended by: Estill Dooms on: 11/22/2015 01:34 PM   Modules accepted: Level of Service

## 2015-11-24 ENCOUNTER — Other Ambulatory Visit (INDEPENDENT_AMBULATORY_CARE_PROVIDER_SITE_OTHER): Payer: Medicare Other | Admitting: *Deleted

## 2015-11-24 DIAGNOSIS — I509 Heart failure, unspecified: Secondary | ICD-10-CM | POA: Diagnosis not present

## 2015-11-24 DIAGNOSIS — I5022 Chronic systolic (congestive) heart failure: Secondary | ICD-10-CM | POA: Diagnosis not present

## 2015-11-24 LAB — BASIC METABOLIC PANEL
BUN: 36 mg/dL — ABNORMAL HIGH (ref 7–25)
CO2: 32 mmol/L — AB (ref 20–31)
Calcium: 9 mg/dL (ref 8.6–10.4)
Chloride: 99 mmol/L (ref 98–110)
Creat: 1.31 mg/dL — ABNORMAL HIGH (ref 0.60–0.88)
GLUCOSE: 183 mg/dL — AB (ref 65–99)
POTASSIUM: 4.3 mmol/L (ref 3.5–5.3)
SODIUM: 139 mmol/L (ref 135–146)

## 2015-11-29 ENCOUNTER — Telehealth: Payer: Self-pay | Admitting: *Deleted

## 2015-11-29 NOTE — Telephone Encounter (Signed)
-----   Message from Nancy Berthold, NP sent at 11/25/2015 12:58 PM EST ----- Please notify patient of stable labs.

## 2015-12-06 ENCOUNTER — Encounter: Payer: Self-pay | Admitting: Internal Medicine

## 2016-01-12 DIAGNOSIS — H353222 Exudative age-related macular degeneration, left eye, with inactive choroidal neovascularization: Secondary | ICD-10-CM | POA: Diagnosis not present

## 2016-01-12 DIAGNOSIS — H353124 Nonexudative age-related macular degeneration, left eye, advanced atrophic with subfoveal involvement: Secondary | ICD-10-CM | POA: Diagnosis not present

## 2016-01-12 DIAGNOSIS — H3589 Other specified retinal disorders: Secondary | ICD-10-CM | POA: Diagnosis not present

## 2016-01-12 DIAGNOSIS — H353211 Exudative age-related macular degeneration, right eye, with active choroidal neovascularization: Secondary | ICD-10-CM | POA: Diagnosis not present

## 2016-01-12 DIAGNOSIS — Z961 Presence of intraocular lens: Secondary | ICD-10-CM | POA: Diagnosis not present

## 2016-02-08 ENCOUNTER — Ambulatory Visit (INDEPENDENT_AMBULATORY_CARE_PROVIDER_SITE_OTHER): Payer: Medicare Other | Admitting: *Deleted

## 2016-02-08 DIAGNOSIS — I495 Sick sinus syndrome: Secondary | ICD-10-CM | POA: Diagnosis not present

## 2016-02-10 ENCOUNTER — Encounter: Payer: Self-pay | Admitting: *Deleted

## 2016-02-10 NOTE — Progress Notes (Signed)
Remote pacemaker transmission.   

## 2016-02-11 LAB — CUP PACEART REMOTE DEVICE CHECK
Brady Statistic AP VP Percent: 7 %
Brady Statistic AS VP Percent: 40 %
Brady Statistic AS VS Percent: 53 %
Date Time Interrogation Session: 20170306162058
Implantable Lead Implant Date: 20050920
Implantable Lead Location: 753860
Implantable Lead Model: 5076
Lead Channel Setting Pacing Amplitude: 2.5 V
Lead Channel Setting Pacing Amplitude: 2.5 V
Lead Channel Setting Sensing Sensitivity: 2 mV
MDC IDC LEAD IMPLANT DT: 20050920
MDC IDC LEAD LOCATION: 753859
MDC IDC MSMT BATTERY IMPEDANCE: 109 Ohm
MDC IDC MSMT BATTERY REMAINING LONGEVITY: 162 mo
MDC IDC MSMT BATTERY VOLTAGE: 2.8 V
MDC IDC MSMT LEADCHNL RA IMPEDANCE VALUE: 432 Ohm
MDC IDC MSMT LEADCHNL RV IMPEDANCE VALUE: 538 Ohm
MDC IDC SET LEADCHNL RV PACING PULSEWIDTH: 0.4 ms
MDC IDC STAT BRADY AP VS PERCENT: 0 %

## 2016-02-11 NOTE — Progress Notes (Signed)
Normal remote reviewed. +Warfarin, V rates controlled  Next [facility] 05/09/16

## 2016-02-15 ENCOUNTER — Encounter: Payer: Self-pay | Admitting: Cardiology

## 2016-03-07 ENCOUNTER — Encounter: Payer: Self-pay | Admitting: Nurse Practitioner

## 2016-03-08 DIAGNOSIS — H353211 Exudative age-related macular degeneration, right eye, with active choroidal neovascularization: Secondary | ICD-10-CM | POA: Diagnosis not present

## 2016-03-12 ENCOUNTER — Encounter: Payer: Self-pay | Admitting: *Deleted

## 2016-03-12 DIAGNOSIS — Z7901 Long term (current) use of anticoagulants: Secondary | ICD-10-CM | POA: Diagnosis not present

## 2016-03-12 DIAGNOSIS — E119 Type 2 diabetes mellitus without complications: Secondary | ICD-10-CM | POA: Diagnosis not present

## 2016-03-12 DIAGNOSIS — E785 Hyperlipidemia, unspecified: Secondary | ICD-10-CM | POA: Diagnosis not present

## 2016-03-12 DIAGNOSIS — I480 Paroxysmal atrial fibrillation: Secondary | ICD-10-CM | POA: Diagnosis not present

## 2016-03-12 DIAGNOSIS — N182 Chronic kidney disease, stage 2 (mild): Secondary | ICD-10-CM | POA: Diagnosis not present

## 2016-03-12 LAB — HEPATIC FUNCTION PANEL
ALT: 14 U/L (ref 7–35)
AST: 17 U/L (ref 13–35)
Alkaline Phosphatase: 69 U/L (ref 25–125)
BILIRUBIN, TOTAL: 1 mg/dL

## 2016-03-12 LAB — LIPID PANEL
Cholesterol: 111 mg/dL (ref 0–200)
HDL: 41 mg/dL (ref 35–70)
LDL CALC: 44 mg/dL
TRIGLYCERIDES: 131 mg/dL (ref 40–160)

## 2016-03-12 LAB — BASIC METABOLIC PANEL
BUN: 32 mg/dL — AB (ref 4–21)
Creatinine: 1.3 mg/dL — AB (ref 0.5–1.1)
Glucose: 152 mg/dL
Potassium: 4.5 mmol/L (ref 3.4–5.3)
SODIUM: 138 mmol/L (ref 137–147)

## 2016-03-12 LAB — PROTIME-INR: PROTIME: 27.8 s — AB (ref 10.0–13.8)

## 2016-03-12 LAB — POCT INR: INR: 2.6 — AB (ref 0.9–1.1)

## 2016-03-12 LAB — HEMOGLOBIN A1C: HEMOGLOBIN A1C: 8.2

## 2016-03-14 ENCOUNTER — Telehealth: Payer: Self-pay

## 2016-03-14 NOTE — Telephone Encounter (Signed)
Called patient about INR 2.55 from 03/12/16, continue same dose per Dr. Nyoka Cowden. Has appt with Dr. Nyoka Cowden 03/20/16.

## 2016-03-16 ENCOUNTER — Other Ambulatory Visit: Payer: Self-pay | Admitting: Internal Medicine

## 2016-03-20 ENCOUNTER — Encounter: Payer: Self-pay | Admitting: Internal Medicine

## 2016-03-20 ENCOUNTER — Non-Acute Institutional Stay: Payer: Medicare Other | Admitting: Internal Medicine

## 2016-03-20 VITALS — BP 124/72 | HR 65 | Temp 97.3°F | Ht 63.0 in | Wt 129.0 lb

## 2016-03-20 DIAGNOSIS — I4819 Other persistent atrial fibrillation: Secondary | ICD-10-CM

## 2016-03-20 DIAGNOSIS — I10 Essential (primary) hypertension: Secondary | ICD-10-CM

## 2016-03-20 DIAGNOSIS — R609 Edema, unspecified: Secondary | ICD-10-CM

## 2016-03-20 DIAGNOSIS — IMO0002 Reserved for concepts with insufficient information to code with codable children: Secondary | ICD-10-CM

## 2016-03-20 DIAGNOSIS — I481 Persistent atrial fibrillation: Secondary | ICD-10-CM | POA: Diagnosis not present

## 2016-03-20 DIAGNOSIS — N182 Chronic kidney disease, stage 2 (mild): Secondary | ICD-10-CM

## 2016-03-20 DIAGNOSIS — E1165 Type 2 diabetes mellitus with hyperglycemia: Secondary | ICD-10-CM | POA: Diagnosis not present

## 2016-03-20 DIAGNOSIS — N183 Chronic kidney disease, stage 3 unspecified: Secondary | ICD-10-CM

## 2016-03-20 DIAGNOSIS — E785 Hyperlipidemia, unspecified: Secondary | ICD-10-CM | POA: Diagnosis not present

## 2016-03-20 DIAGNOSIS — E1122 Type 2 diabetes mellitus with diabetic chronic kidney disease: Secondary | ICD-10-CM

## 2016-03-20 DIAGNOSIS — I5022 Chronic systolic (congestive) heart failure: Secondary | ICD-10-CM | POA: Diagnosis not present

## 2016-03-20 NOTE — Progress Notes (Signed)
Patient ID: Nancy Payne, female   DOB: 03/05/1922, 82 y.o.   MRN: [id number]    [facility]     Place of Service: Clinic (12)     Allergies  Allergen Reactions  . Penicillins Other (See Comments)    Temperature went up to 104 F  . Penicillin G Benzathine & Proc Other (See Comments)    Unknown    Chief Complaint  Patient presents with  . Medical Management of Chronic Issues    4 month check blood sugar, blood pressure, A-Fib, CHF, cholesterol, review labs.    HPI:  Uncontrolled type 2 diabetes mellitus with stage 2 chronic kidney disease, without long-term current use of insulin (HCC) - Hemoglobin A1c slightly better than when last checked. Now 8.2.  Essential hypertension - controlled  Kidney disease, chronic, stage III (GFR 30-59 ml/min) - unchanged  Chronic systolic congestive heart failure (HCC) - compensated  Persistent atrial fibrillation ([place]) right controlled. Anticoagulated.-   Hyperlipidemia - Controlled on rosuvastatin   Edema, unspecified type - 2+ bipedal. Unchanged.    Medications: Patient's Medications  New Prescriptions   No medications on file  Previous Medications   FUROSEMIDE (LASIX) 40 MG TABLET    Take 1.5 tablets (60 mg total) by mouth daily.   GLIMEPIRIDE (AMARYL) 2 MG TABLET    TAKE ONE TABLET BY MOUTH ONCE DAILY WITH BREAKFAST TO CONTROL BLOOD SUGAR   METFORMIN (GLUCOPHAGE) 500 MG TABLET    TAKE 1 TABLET EVERY DAY TO CONTROL GLUCOSE   METOPROLOL TARTRATE (LOPRESSOR) 25 MG TABLET    TAKE ONE TABLET BY MOUTH TWICE DAILY TO CONTROL BLOOD PRESSURE   MULTIPLE VITAMINS-MINERALS (MULTIVITAMIN PO)    Take 1 tablet by mouth daily.   ROSUVASTATIN (CRESTOR) 10 MG TABLET    Take 10 mg by mouth at bedtime.   SPIRONOLACTONE (ALDACTONE) 25 MG TABLET    Take 25 mg by mouth daily.   WARFARIN (COUMADIN) 5 MG TABLET    TAKE 1 TABLET BY MOUTH EVERY DAY FOR ANTICOAGULATION  Modified Medications   No medications on file  Discontinued  Medications   No medications on file     Review of Systems  Constitutional: Positive for activity change and fatigue. Negative for fever, chills, appetite change and unexpected weight change.  HENT: Negative for congestion, ear pain, mouth sores, sinus pressure and sore throat.        Hx of allergic symptoms manifested by rhinorrhea, teary eyes, sneezing, coughing.  Respiratory: Negative for cough, chest tightness and shortness of breath.   Cardiovascular: Negative for chest pain, palpitations and leg swelling.       History of atrial fibrillation and aortic valvular stenosis. She is on chronic anticoagulation with warfarin for these problems.  Gastrointestinal: Negative for nausea, abdominal pain, diarrhea, constipation, blood in stool and abdominal distention.  Endocrine: Negative.   Genitourinary: Negative for dysuria, decreased urine volume and difficulty urinating.  Musculoskeletal: Positive for back pain and gait problem (Last fall 03/07/2015). Negative for myalgias, joint swelling, neck pain and neck stiffness.  Allergic/Immunologic: Negative.   Neurological: Negative for dizziness, tremors, seizures, syncope, facial asymmetry, speech difficulty, weakness, light-headedness and headaches.  Hematological: Negative.   Psychiatric/Behavioral: Negative for confusion, sleep disturbance, self-injury, decreased concentration and agitation. The patient is not nervous/anxious and is not hyperactive.     Filed Vitals:   03/20/16 0954  BP: 124/72  Pulse: 65  Temp: 97.3 F (36.3 C)  TempSrc: Oral  Height: _0  (1.6 m)  Weight: 129 lb (58.514 kg)  SpO2: 99%   Wt Readings from Last 3 Encounters:  03/20/16 129 lb (58.514 kg)  11/22/15 129 lb (58.514 kg)  11/09/15 129 lb (58.514 kg)    Body mass index is 22.86 kg/(m^2).  Physical Exam  Constitutional: She is oriented to person, place, and time. She appears well-developed. No distress.  HENT:  Head: Normocephalic and atraumatic.    Nose: Nose normal.  Xerostomia  Eyes: Conjunctivae and EOM are normal. Pupils are equal, round, and reactive to light. Left eye exhibits no discharge. No scleral icterus.  Xerophthalmia  Neck: Normal range of motion. Neck supple. No JVD present. No tracheal deviation present. No thyromegaly present.  Cardiovascular: Exam reveals no friction rub.   Murmur heard. 3/6 LSB SEM. Pacemaker in left upper chest. AF.  Pulmonary/Chest: No respiratory distress. She has no wheezes. She has no rales.  Abdominal: Bowel sounds are normal. She exhibits no distension and no mass. There is no tenderness.  Musculoskeletal: Normal range of motion. She exhibits no edema or tenderness.  Edematous legs bilaterally.  Lymphadenopathy:    She has no cervical adenopathy.  Neurological: She is alert and oriented to person, place, and time. She has normal reflexes. No cranial nerve deficit. Coordination normal.  Skin: Skin is dry. No rash noted. No erythema. No pallor.  Psychiatric: She has a normal mood and affect. Her behavior is normal. Thought content normal.     Labs reviewed: Lab Summary Latest Ref Rng 03/12/2016 11/24/2015 11/14/2015 11/09/2015 07/11/2015  Hemoglobin 13.0-17.0 g/dL (None) (None) (None) (None) (None)  Hematocrit 39.0-52.0 % (None) (None) (None) (None) (None)  White count - (None) (None) (None) (None) (None)  Platelet count - (None) (None) (None) (None) (None)  Sodium 137 - 147 mmol/L 138 139 141 137 138  Potassium 3.4 - 5.3 mmol/L 4.5 4.3 4.7 4.4 4.8  Calcium 8.6 - 10.4 mg/dL (None) 9.0 (None) 9.3 (None)  Phosphorus - (None) (None) (None) (None) (None)  Creatinine 0.5 - 1.1 mg/dL 1.3(A) 1.31(H) 1.3(A) 1.26(H) 1.2(A)  AST 13 - 35 U/L 17 (None) 21 (None) 18  Alk Phos 25 - 125 U/L 69 (None) 64 (None) 66  Bilirubin - (None) (None) (None) (None) (None)  Glucose - 152 183(H) 121 230(H) 130  Cholesterol 0 - 200 mg/dL 111 (None) (None) (None) 97  HDL cholesterol 35 - 70 mg/dL 41 (None) (None)  (None) 38  Triglycerides 40 - 160 mg/dL 131 (None) (None) (None) 86  LDL Direct - (None) (None) (None) (None) (None)  LDL Calc - 44 (None) (None) (None) 42  Total protein - (None) (None) (None) (None) (None)  Albumin - (None) (None) (None) (None) (None)   Lab Results  Component Value Date   TSH 3.23 11/02/2013   Lab Results  Component Value Date   BUN 32* 03/12/2016   BUN 36* 11/24/2015   BUN 33* 11/14/2015   Lab Results  Component Value Date   CREATININE 1.3* 03/12/2016   CREATININE 1.31* 11/24/2015   CREATININE 1.3* 11/14/2015   Lab Results  Component Value Date   HGBA1C 8.2 03/12/2016   HGBA1C 8.3* 11/14/2015   HGBA1C 8.4* 07/11/2015       Assessment/Plan 1. Uncontrolled type 2 diabetes mellitus with stage 2 chronic kidney disease, without long-term current use of insulin (HCC) Continue current medication -A1c, urine microalbumin, BMP prior to next visit  2. Essential hypertension -BMP, future  3. Kidney disease, chronic, stage III (GFR 30-59 ml/min) -BMP, future  4. Chronic systolic congestive  heart failure ([place]) Compensated  5. Persistent atrial fibrillation (HCC) Rate controlled and anticoagulated with therapeutic INR  6. Hyperlipidemia -Controlled on rosuvastatin  7. Edema, unspecified type Chronic and unchanged

## 2016-03-23 DIAGNOSIS — L602 Onychogryphosis: Secondary | ICD-10-CM | POA: Diagnosis not present

## 2016-03-23 DIAGNOSIS — L84 Corns and callosities: Secondary | ICD-10-CM | POA: Diagnosis not present

## 2016-03-23 DIAGNOSIS — E1159 Type 2 diabetes mellitus with other circulatory complications: Secondary | ICD-10-CM | POA: Diagnosis not present

## 2016-03-31 DIAGNOSIS — M79605 Pain in left leg: Secondary | ICD-10-CM | POA: Insufficient documentation

## 2016-04-03 ENCOUNTER — Encounter: Payer: Self-pay | Admitting: Internal Medicine

## 2016-04-03 ENCOUNTER — Non-Acute Institutional Stay: Payer: Medicare Other | Admitting: Internal Medicine

## 2016-04-03 VITALS — BP 114/60 | HR 64 | Temp 97.4°F | Ht 63.0 in | Wt 128.0 lb

## 2016-04-03 DIAGNOSIS — I481 Persistent atrial fibrillation: Secondary | ICD-10-CM | POA: Diagnosis not present

## 2016-04-03 DIAGNOSIS — Z7901 Long term (current) use of anticoagulants: Secondary | ICD-10-CM | POA: Diagnosis not present

## 2016-04-03 DIAGNOSIS — I4819 Other persistent atrial fibrillation: Secondary | ICD-10-CM

## 2016-04-03 DIAGNOSIS — I10 Essential (primary) hypertension: Secondary | ICD-10-CM

## 2016-04-03 DIAGNOSIS — M79605 Pain in left leg: Secondary | ICD-10-CM | POA: Diagnosis not present

## 2016-04-03 NOTE — Progress Notes (Signed)
Patient ID: Nancy Payne, female   DOB: 1922/05/19, 82 y.o.   MRN: [id number]    [facility]     Place of Service: Clinic (12)     Allergies  Allergen Reactions  . Penicillins Other (See Comments)    Temperature went up to 104 F  . Penicillin G Benzathine & Proc Other (See Comments)    Unknown    Chief Complaint  Patient presents with  . Acute Visit    started with lower leg muscle tightening up. That is some better, but now hurts up in upper thigh area. Tender like a bruise. No injury. Started 03/31/16. Takes 2 Tylenol every 8 hours. Comes and goes.    HPI:  Acute problem with pain in the left leg. Started in the calf and worked its way up into the thigh. Denies any grinding of the knee or discomfort there. No history of previous DVT. Patient is on warfarin and had a normal INR of 2.63 weeks ago. She denies dyspnea or chest discomfort. Denies palpitations. Has known atrial fibrillation for which she takes the warfarin.  States that the pain comes and goes. When sitting, she is quite comfortable. When attempting to walk this morning she is somewhat hobbled by the pains.  Medications: Patient's Medications  New Prescriptions   No medications on file  Previous Medications   FUROSEMIDE (LASIX) 40 MG TABLET    Take 1.5 tablets (60 mg total) by mouth daily.   GLIMEPIRIDE (AMARYL) 2 MG TABLET    TAKE ONE TABLET BY MOUTH ONCE DAILY WITH BREAKFAST TO CONTROL BLOOD SUGAR   METFORMIN (GLUCOPHAGE) 500 MG TABLET    TAKE 1 TABLET EVERY DAY TO CONTROL GLUCOSE   METOPROLOL TARTRATE (LOPRESSOR) 25 MG TABLET    TAKE ONE TABLET BY MOUTH TWICE DAILY TO CONTROL BLOOD PRESSURE   MULTIPLE VITAMINS-MINERALS (MULTIVITAMIN PO)    Take 1 tablet by mouth daily.   ROSUVASTATIN (CRESTOR) 10 MG TABLET    Take 10 mg by mouth at bedtime.   SPIRONOLACTONE (ALDACTONE) 25 MG TABLET    Take 25 mg by mouth daily.   WARFARIN (COUMADIN) 5 MG TABLET    TAKE 1 TABLET BY MOUTH EVERY DAY FOR  ANTICOAGULATION  Modified Medications   No medications on file  Discontinued Medications   No medications on file     Review of Systems  Constitutional: Positive for activity change and fatigue. Negative for fever, chills, appetite change and unexpected weight change.  HENT: Negative for congestion, ear pain, mouth sores, sinus pressure and sore throat.        Hx of allergic symptoms manifested by rhinorrhea, teary eyes, sneezing, coughing.  Respiratory: Negative for cough, chest tightness and shortness of breath.   Cardiovascular: Negative for chest pain, palpitations and leg swelling.       History of atrial fibrillation and aortic valvular stenosis. She is on chronic anticoagulation with warfarin for these problems.  Gastrointestinal: Negative for nausea, abdominal pain, diarrhea, constipation, blood in stool and abdominal distention.  Endocrine: Negative.   Genitourinary: Negative for dysuria, decreased urine volume and difficulty urinating.  Musculoskeletal: Positive for back pain and gait problem (Last fall 03/07/2015). Negative for myalgias, joint swelling, neck pain and neck stiffness.       Acute onset of pain in the left leg since 03/31/16.  Allergic/Immunologic: Negative.   Neurological: Negative for dizziness, tremors, seizures, syncope, facial asymmetry, speech difficulty, weakness, light-headedness and headaches.  Hematological: Negative.   Psychiatric/Behavioral: Negative for  confusion, sleep disturbance, self-injury, decreased concentration and agitation. The patient is not nervous/anxious and is not hyperactive.     Filed Vitals:   04/03/16 0957  BP: 114/60  Pulse: 64  Temp: 97.4 F (36.3 C)  TempSrc: Oral  Height: '5\' 3"'  (1.6 m)  Weight: 128 lb (58.06 kg)  SpO2: 95%   Wt Readings from Last 3 Encounters:  04/03/16 128 lb (58.06 kg)  03/20/16 129 lb (58.514 kg)  11/22/15 129 lb (58.514 kg)    Body mass index is 22.68 kg/(m^2).  Physical Exam    Constitutional: She is oriented to person, place, and time. She appears well-developed. No distress.  HENT:  Head: Normocephalic and atraumatic.  Nose: Nose normal.  Xerostomia  Eyes: Conjunctivae and EOM are normal. Pupils are equal, round, and reactive to light. Left eye exhibits no discharge. No scleral icterus.  Xerophthalmia  Neck: Normal range of motion. Neck supple. No JVD present. No tracheal deviation present. No thyromegaly present.  Cardiovascular: Exam reveals no friction rub.   Murmur heard. 3/6 LSB SEM. Pacemaker in left upper chest. AF.  Pulmonary/Chest: No respiratory distress. She has no wheezes. She has no rales.  Abdominal: Bowel sounds are normal. She exhibits no distension and no mass. There is no tenderness.  Musculoskeletal: Normal range of motion. She exhibits no edema or tenderness.  Edematous legs bilaterally. Tender in the left calf and medial left thigh. There is a quite mobile at the knee and hip joints.  Lymphadenopathy:    She has no cervical adenopathy.  Neurological: She is alert and oriented to person, place, and time. She has normal reflexes. No cranial nerve deficit. Coordination normal.  Skin: Skin is dry. No rash noted. No erythema. No pallor.  Psychiatric: She has a normal mood and affect. Her behavior is normal. Thought content normal.     Labs reviewed: Lab Summary Latest Ref Rng 03/12/2016 11/24/2015 11/14/2015 11/09/2015 07/11/2015  Hemoglobin 13.0-17.0 g/dL (None) (None) (None) (None) (None)  Hematocrit 39.0-52.0 % (None) (None) (None) (None) (None)  White count - (None) (None) (None) (None) (None)  Platelet count - (None) (None) (None) (None) (None)  Sodium 137 - 147 mmol/L 138 139 141 137 138  Potassium 3.4 - 5.3 mmol/L 4.5 4.3 4.7 4.4 4.8  Calcium 8.6 - 10.4 mg/dL (None) 9.0 (None) 9.3 (None)  Phosphorus - (None) (None) (None) (None) (None)  Creatinine 0.5 - 1.1 mg/dL 1.3(A) 1.31(H) 1.3(A) 1.26(H) 1.2(A)  AST 13 - 35 U/L 17 (None) 21  (None) 18  Alk Phos 25 - 125 U/L 69 (None) 64 (None) 66  Bilirubin - (None) (None) (None) (None) (None)  Glucose - 152 183(H) 121 230(H) 130  Cholesterol 0 - 200 mg/dL 111 (None) (None) (None) 97  HDL cholesterol 35 - 70 mg/dL 41 (None) (None) (None) 38  Triglycerides 40 - 160 mg/dL 131 (None) (None) (None) 86  LDL Direct - (None) (None) (None) (None) (None)  LDL Calc - 44 (None) (None) (None) 42  Total protein - (None) (None) (None) (None) (None)  Albumin - (None) (None) (None) (None) (None)   Lab Results  Component Value Date   TSH 3.23 11/02/2013   Lab Results  Component Value Date   BUN 32* 03/12/2016   BUN 36* 11/24/2015   BUN 33* 11/14/2015   Lab Results  Component Value Date   CREATININE 1.3* 03/12/2016   CREATININE 1.31* 11/24/2015   CREATININE 1.3* 11/14/2015   Lab Results  Component Value Date   HGBA1C 8.2 03/12/2016  HGBA1C 8.3* 11/14/2015   HGBA1C 8.4* 07/11/2015       Assessment/Plan  1. Pain In Left Leg -Referred for Doppler study of the left leg veins to rule out DVT  2. Persistent atrial fibrillation (HCC) Controlled rate and anticoagulated  3. Essential hypertension Controlled  4. Long term current use of anticoagulant therapy Last INR therapeutic. Remains on warfarin.

## 2016-04-05 ENCOUNTER — Other Ambulatory Visit: Payer: Self-pay | Admitting: Internal Medicine

## 2016-04-05 ENCOUNTER — Ambulatory Visit (HOSPITAL_COMMUNITY)
Admission: RE | Admit: 2016-04-05 | Discharge: 2016-04-05 | Disposition: A | Discharge status: Home / Self Care | Payer: Medicare Other | Source: Ambulatory Visit | Attending: Cardiovascular Disease | Admitting: Cardiovascular Disease

## 2016-04-05 DIAGNOSIS — I130 Hypertensive heart and chronic kidney disease with heart failure and stage 1 through stage 4 chronic kidney disease, or unspecified chronic kidney disease: Secondary | ICD-10-CM | POA: Diagnosis not present

## 2016-04-05 DIAGNOSIS — M79605 Pain in left leg: Secondary | ICD-10-CM

## 2016-04-05 DIAGNOSIS — E785 Hyperlipidemia, unspecified: Secondary | ICD-10-CM | POA: Diagnosis not present

## 2016-04-05 DIAGNOSIS — N183 Chronic kidney disease, stage 3 (moderate): Secondary | ICD-10-CM | POA: Diagnosis not present

## 2016-04-05 DIAGNOSIS — E1122 Type 2 diabetes mellitus with diabetic chronic kidney disease: Secondary | ICD-10-CM | POA: Insufficient documentation

## 2016-04-05 DIAGNOSIS — I509 Heart failure, unspecified: Secondary | ICD-10-CM | POA: Insufficient documentation

## 2016-04-09 ENCOUNTER — Other Ambulatory Visit: Payer: Self-pay | Admitting: Internal Medicine

## 2016-04-26 DIAGNOSIS — Z961 Presence of intraocular lens: Secondary | ICD-10-CM | POA: Diagnosis not present

## 2016-04-26 DIAGNOSIS — H353211 Exudative age-related macular degeneration, right eye, with active choroidal neovascularization: Secondary | ICD-10-CM | POA: Diagnosis not present

## 2016-04-26 DIAGNOSIS — H3589 Other specified retinal disorders: Secondary | ICD-10-CM | POA: Diagnosis not present

## 2016-05-09 ENCOUNTER — Telehealth: Payer: Self-pay | Admitting: Cardiology

## 2016-05-09 ENCOUNTER — Ambulatory Visit (INDEPENDENT_AMBULATORY_CARE_PROVIDER_SITE_OTHER): Payer: Medicare Other | Admitting: *Deleted

## 2016-05-09 DIAGNOSIS — I495 Sick sinus syndrome: Secondary | ICD-10-CM | POA: Diagnosis not present

## 2016-05-09 NOTE — Telephone Encounter (Signed)
Spoke with pt and reminded pt of remote transmission that is due today. Pt verbalized understanding.   

## 2016-05-09 NOTE — Progress Notes (Signed)
Remote pacemaker transmission.   

## 2016-05-17 LAB — CUP PACEART REMOTE DEVICE CHECK
Battery Impedance: 132 Ohm
Battery Remaining Longevity: 154 mo
Battery Voltage: 2.8 V
Date Time Interrogation Session: 20170607144506
Implantable Lead Location: 753859
Implantable Lead Model: 5076
Lead Channel Setting Pacing Amplitude: 2.5 V
Lead Channel Setting Pacing Pulse Width: 0.4 ms
Lead Channel Setting Sensing Sensitivity: 2 mV
MDC IDC LEAD IMPLANT DT: 20050920
MDC IDC LEAD IMPLANT DT: 20050920
MDC IDC LEAD LOCATION: 753860
MDC IDC MSMT LEADCHNL RA IMPEDANCE VALUE: 426 Ohm
MDC IDC MSMT LEADCHNL RV IMPEDANCE VALUE: 527 Ohm
MDC IDC MSMT LEADCHNL RV PACING THRESHOLD AMPLITUDE: 0.75 V
MDC IDC MSMT LEADCHNL RV PACING THRESHOLD PULSEWIDTH: 0.4 ms
MDC IDC MSMT LEADCHNL RV SENSING INTR AMPL: 5.6 mV
MDC IDC SET LEADCHNL RV PACING AMPLITUDE: 2.5 V
MDC IDC STAT BRADY AP VP PERCENT: 6 %
MDC IDC STAT BRADY AP VS PERCENT: 0 %
MDC IDC STAT BRADY AS VP PERCENT: 37 %
MDC IDC STAT BRADY AS VS PERCENT: 57 %

## 2016-05-18 ENCOUNTER — Ambulatory Visit: Payer: PRIVATE HEALTH INSURANCE | Admitting: Nurse Practitioner

## 2016-05-20 ENCOUNTER — Other Ambulatory Visit: Payer: Self-pay | Admitting: Internal Medicine

## 2016-05-23 ENCOUNTER — Ambulatory Visit: Payer: PRIVATE HEALTH INSURANCE | Admitting: Nurse Practitioner

## 2016-05-23 ENCOUNTER — Other Ambulatory Visit: Payer: Self-pay | Admitting: Internal Medicine

## 2016-05-23 ENCOUNTER — Encounter: Payer: Self-pay | Admitting: Cardiology

## 2016-06-15 ENCOUNTER — Other Ambulatory Visit: Payer: Self-pay | Admitting: Internal Medicine

## 2016-06-21 DIAGNOSIS — H353211 Exudative age-related macular degeneration, right eye, with active choroidal neovascularization: Secondary | ICD-10-CM | POA: Diagnosis not present

## 2016-06-21 DIAGNOSIS — H3589 Other specified retinal disorders: Secondary | ICD-10-CM | POA: Diagnosis not present

## 2016-06-21 DIAGNOSIS — H353123 Nonexudative age-related macular degeneration, left eye, advanced atrophic without subfoveal involvement: Secondary | ICD-10-CM | POA: Diagnosis not present

## 2016-06-21 DIAGNOSIS — Z961 Presence of intraocular lens: Secondary | ICD-10-CM | POA: Diagnosis not present

## 2016-06-26 ENCOUNTER — Ambulatory Visit (INDEPENDENT_AMBULATORY_CARE_PROVIDER_SITE_OTHER): Payer: Medicare Other | Admitting: Nurse Practitioner

## 2016-06-26 ENCOUNTER — Encounter: Payer: Self-pay | Admitting: Nurse Practitioner

## 2016-06-26 VITALS — BP 118/70 | HR 74 | Ht 63.0 in | Wt 124.0 lb

## 2016-06-26 DIAGNOSIS — I5022 Chronic systolic (congestive) heart failure: Secondary | ICD-10-CM | POA: Diagnosis not present

## 2016-06-26 DIAGNOSIS — Z7901 Long term (current) use of anticoagulants: Secondary | ICD-10-CM

## 2016-06-26 DIAGNOSIS — I10 Essential (primary) hypertension: Secondary | ICD-10-CM

## 2016-06-26 DIAGNOSIS — I482 Chronic atrial fibrillation, unspecified: Secondary | ICD-10-CM

## 2016-06-26 LAB — BASIC METABOLIC PANEL
BUN: 40 mg/dL — ABNORMAL HIGH (ref 7–25)
CO2: 27 mmol/L (ref 20–31)
Calcium: 9 mg/dL (ref 8.6–10.4)
Chloride: 100 mmol/L (ref 98–110)
Creat: 1.29 mg/dL — ABNORMAL HIGH (ref 0.60–0.88)
Glucose, Bld: 118 mg/dL — ABNORMAL HIGH (ref 65–99)
Potassium: 4.5 mmol/L (ref 3.5–5.3)
Sodium: 138 mmol/L (ref 135–146)

## 2016-06-26 LAB — CBC
HCT: 38.3 % (ref 35.0–45.0)
Hemoglobin: 12.9 g/dL (ref 11.7–15.5)
MCH: 30.9 pg (ref 27.0–33.0)
MCHC: 33.7 g/dL (ref 32.0–36.0)
MCV: 91.6 fL (ref 80.0–100.0)
MPV: 9.4 fL (ref 7.5–12.5)
Platelets: 192 10*3/uL (ref 140–400)
RBC: 4.18 MIL/uL (ref 3.80–5.10)
RDW: 13.9 % (ref 11.0–15.0)
WBC: 6.4 10*3/uL (ref 3.8–10.8)

## 2016-06-26 NOTE — Progress Notes (Signed)
CARDIOLOGY OFFICE NOTE  Date:  06/26/2016    [name] Date of Birth: 1922-04-28 Medical Record [id number]  PCP:  Jeanmarie Hubert, MD  Cardiologist:  [place] and [place]  Chief Complaint  Patient presents with  . Atrial Fibrillation  . Hypertension  . Cardiac Valve Problem  . Cardiomyopathy    Follow up visit - seen for Dr. Gildardo Pounds    History of Present Illness: Nancy Payne is a 82 y.o. female who presents today for a follow up visit. Seen for Dr. [place]ique. Sees Dr. Rayann Heman for EP.   She has multiple issues which include underlying pacemaker for SSS, paroxysmal atrial fib which has progressed to chronic AF/flutter, AS with past AVR, HTN, edema, type 2 DM, HLD, spinal stenosis and chronic LV dysfunction. Echo from April of 2014 showed an EF of 40%. She underwent AVR with resection and grafting of the proximal descending thoracic aorta and repair of a left ventricular perforation in September of 2005. She has had past right leg fracture.   I last saw her back last May. Saw EP in December.   Comes in today. Here with her daughter in law - Nancy Payne. Continues to do very well. She has just gotten over a sprained ankle. No falls. Does not look like her coumadin has been checked in a while. Seeing PCP next month. No chest pain. Breathing is ok. Weight is down a few pounds - she notes she is "eating better". She and her family are very happy with how she is doing.  Past Medical History:  Diagnosis Date  . Atrial fibrillation ([place])    persistent afib/ atrial flutter  . Brain atrophy 2009  . Congestive heart failure (CHF) ([place])   . Eczema   . Edema   . Hyperlipidemia   . Hypertension   . Kidney disease, chronic, stage III (GFR 30-59 ml/min)   . Macular degeneration 2012   left   . Sick sinus syndrome ([place])    post DDD pacemaker  . Skin cancer of nose 2012   with skin graft  . Type II or unspecified type diabetes mellitus with renal manifestations, not stated as  uncontrolled    type 2  . Xerophthalmia 2012  . Xerostomia 2012    Past Surgical History:  Procedure Laterality Date  . ANKLE SURGERY Right 1990   fracture as steel plate   . CARDIAC VALVE SURGERY  2005   Dr. Ricard Dillon  . CATARACT EXTRACTION W/ INTRAOCULAR LENS IMPLANT Left 11/11/14   [place]  2008  . EYE SURGERY    . KNEE SURGERY Right 1990   fracture has plate and rod in femur  . PACEMAKER GENERATOR CHANGE  07/19/2014   Dr. Rayann Heman MDT  . PACEMAKER INSERTION  2005; 07-19-14   MDT ADDRL1 pacemaker generator change by Dr Rayann Heman [id number]  . PERMANENT PACEMAKER GENERATOR CHANGE N/A 07/19/2014   Procedure: PERMANENT PACEMAKER GENERATOR CHANGE;  Surgeon: Coralyn Mark, MD;  Location: [facility] C[facility];  Service: Cardiovascular;  Laterality: N/A;     Medications: Current Outpatient Prescriptions  Medication Sig Dispense Refill  . CRESTOR 10 MG tablet TAKE ONE TABLET BY MOUTH ONCE DAILY TO CONTROL CHOLESTEROL 90 tablet 1  . furosemide (LASIX) 40 MG tablet Take 1.5 tablets (60 mg total) by mouth daily. 135 tablet 2  . glimepiride (AMARYL) 2 MG tablet TAKE ONE TABLET BY MOUTH ONCE DAILY WITH BREAKFAST TO CONTROL BLOOD SUGAR 90 tablet 3  .  metFORMIN (GLUCOPHAGE) 500 MG tablet TAKE 1 TABLET EVERY DAY TO CONTROL GLUCOSE 90 tablet 3  . metoprolol tartrate (LOPRESSOR) 25 MG tablet TAKE 1 TABLET BY MOUTH TWICE A DAY TO CONTROL BLOOD PRESSURE 180 tablet 3  . Multiple Vitamins-Minerals (MULTIVITAMIN PO) Take 1 tablet by mouth daily.    Marland Kitchen spironolactone (ALDACTONE) 25 MG tablet TAKE 1 TABLET BY MOUTH EVERY DAY 90 tablet 1  . warfarin (COUMADIN) 5 MG tablet TAKE 1 TABLET BY MOUTH EVERY DAY FOR ANTICOAGULATION 90 tablet 3   No current facility-administered medications for this visit.     Allergies: Allergies  Allergen Reactions  . Penicillins Other (See Comments)    Temperature went up to 104 F  . Penicillin G Benzathine & Proc Other (See Comments)    Unknown    Social  History: The patient  reports that she has never smoked. She has never used smokeless tobacco. She reports that she does not drink alcohol or use drugs.   Family History: The patient's family history includes Anuerysm in her sister; Cancer in her brother; Stroke in her mother.   Review of Systems: Please see the history of present illness.   Otherwise, the review of systems is positive for none.   All other systems are reviewed and negative.   Physical Exam: VS:  BP 118/70 (BP Location: Left Arm, Patient Position: Sitting, Cuff Size: Normal)   Pulse 74   Ht 5\' 3"  (1.6 m)   Wt 124 lb (56.2 kg)   SpO2 96% Comment: at rest  BMI 21.97 kg/m  .  BMI Body mass index is 21.97 kg/m.  Wt Readings from Last 3 Encounters:  06/26/16 124 lb (56.2 kg)  04/03/16 128 lb (58.1 kg)  03/20/16 129 lb (58.5 kg)    General: Pleasant. Elderly female who is alert and in no acute distress.   HEENT: Normal.  Neck: Supple, no JVD, carotid bruits, or masses noted.  Cardiac: Fairly regular rhythm.  Rate ok. No edema.  Respiratory:  Lungs are clear to auscultation bilaterally with normal work of breathing.  GI: Soft and nontender.  MS: No deformity or atrophy. Gait and ROM intact.  Skin: Warm and dry. Color is normal.  Neuro:  Strength and sensation are intact and no gross focal deficits noted.  Psych: Alert, appropriate and with normal affect.   LABORATORY DATA:  EKG:  EKG is not ordered today.  Lab Results  Component Value Date   WBC 7.3 07/14/2014   HGB 13.9 07/14/2014   HCT 40.5 07/14/2014   PLT 183.0 07/14/2014   GLUCOSE 183 (H) 11/24/2015   CHOL 111 03/12/2016   TRIG 131 03/12/2016   HDL 41 03/12/2016   LDLCALC 44 03/12/2016   ALT 14 03/12/2016   AST 17 03/12/2016   NA 138 03/12/2016   K 4.5 03/12/2016   CL 99 11/24/2015   CREATININE 1.3 (A) 03/12/2016   BUN 32 (A) 03/12/2016   CO2 32 (H) 11/24/2015   TSH 3.23 11/02/2013   INR 2.6 (A) 03/12/2016   HGBA1C 8.2 03/12/2016   Lab  Results  Component Value Date   INR 2.6 (A) 03/12/2016   INR 3.0 (A) 11/22/2015   INR 2.1 (A) 07/19/2015   PROTIME 27.8 (A) 03/12/2016   PROTIME 29.0 (A) 02/03/2015   PROTIME 29.1 (A) 01/06/2015     BNP (last 3 results) No results for input(s): BNP in the last 8760 hours.  ProBNP (last 3 results) No results for input(s): PROBNP in the  last 8760 hours.   Other Studies Reviewed Today:  Echo Study Conclusions from April 2014  - Left ventricle: The cavity size was normal. Wall thickness was increased in a pattern of mild LVH. Systolic function was mildly to moderately reduced. The estimated ejection fraction was in the range of 40% to 45%. There is dyskinesis of the septal myocardium. The study is not technically sufficient to allow evaluation of LV diastolic function. - Aortic valve: A bioprosthesis was present. Trivial regurgitation. - Mitral valve: Calcified annulus. Mildly thickened leaflets . Moderate regurgitation. - Left atrium: The atrium was severely dilated. - Right atrium: The atrium was severely dilated. - Tricuspid valve: Wide-open regurgitation. - Pulmonary arteries: Systolic pressure was mildly increased. PA peak pressure: 12mm Hg (S). - Pericardium, extracardiac: A trivial pericardial effusion was identified. There was a left pleural effusion.    Assessment / Plan: 1. PAF/flutter - with underlying PPM in place - followed by Dr. Rayann Heman.   2. HTN - BP is ok. No change with her current regimen.   3. Chronic coumadin - will get INR today. She is typically checked by PCP.   4. Prior AVR   5. Chronic LV dysfunction - fairly compensated - swelling is stable with her current regimen. Weight is stable. She is not short of breath.  6. Advanced age - doing very well.   Current medicines are reviewed with the patient today.  The patient does not have concerns regarding medicines other than what has been noted above.  The following changes have been  made:  See above.  Labs/ tests ordered today include:    Orders Placed This Encounter  Procedures  . Basic metabolic panel  . CBC  . Protime-INR     Disposition:   FU with Dr. Rayann Heman in December.    Patient is agreeable to this plan and will call if any problems develop in the interim.   Signed: Burtis Junes, RN, ANP-C 06/26/2016 12:36 PM  [facility] [address] [place], [place]  [address] Phone: [phone] Fax: [phone]

## 2016-06-26 NOTE — Patient Instructions (Addendum)
We will be checking the following labs today - BMET, CBC and INR  Medication Instructions:    Continue with your current medicines.     Testing/Procedures To Be Arranged:  N/A  Follow-Up:   See Dr. Rayann Heman in December    Other Special Instructions:   I will send a message to Sherrie Mustache, NP about your coumadin checks    If you need a refill on your cardiac medications before your next appointment, please call your pharmacy.   Call the [place] office at [phone] if you have any questions, problems or concerns.

## 2016-06-27 ENCOUNTER — Telehealth: Payer: Self-pay | Admitting: *Deleted

## 2016-06-27 DIAGNOSIS — Z7901 Long term (current) use of anticoagulants: Secondary | ICD-10-CM

## 2016-06-27 LAB — PROTIME-INR
INR: 5.3 — ABNORMAL HIGH
Prothrombin Time: 52.7 s — ABNORMAL HIGH (ref 9.0–11.5)

## 2016-06-27 MED ORDER — UNABLE TO FIND: 0 refills | Status: DC

## 2016-06-27 NOTE — Telephone Encounter (Signed)
Printed and given to [employer]

## 2016-06-27 NOTE — Telephone Encounter (Signed)
Went yesterday to Dr. Jackalyn Lombard office and they told her not to take her Coumadin for 3 days. Her INR was 5.3 and they told her to hold her Coumadin for 3 days and Dr. Rolly Salter office would restart with a dosage. Please Advise.

## 2016-06-27 NOTE — Telephone Encounter (Signed)
Per Dr. Eulas Post recheck INR on Friday.  Left message on machine with instructions, order faxed to [facility].

## 2016-06-27 NOTE — Telephone Encounter (Signed)
Who is covering Dr. Nyoka Cowden in the office while he is out?

## 2016-06-27 NOTE — Telephone Encounter (Signed)
[facility] called to advise [place] of an alert INR.  Was 5.3 and was verified by being repeated.  Will forward to Juanda Crumble, [place]

## 2016-06-28 ENCOUNTER — Telehealth: Payer: Self-pay | Admitting: *Deleted

## 2016-06-28 ENCOUNTER — Telehealth: Payer: Self-pay | Admitting: Nurse Practitioner

## 2016-06-28 ENCOUNTER — Telehealth: Payer: Self-pay | Admitting: Pharmacist

## 2016-06-28 NOTE — Telephone Encounter (Signed)
Nancy Payne with [facility] called back with an update for pt's Coumadin dose - she has been taking 5mg  daily.

## 2016-06-28 NOTE — Telephone Encounter (Signed)
Received phone call from [place] with [facility] [phone], ext [phone].   She reports that pt's anticoagulation usually managed by Dr. Nyoka Cowden with [facility]. She requests that we manage patient's anticoagulation instead because INR was being followed every 4-6 months.  Pt's INR was checked when she saw our NP 2 days ago - it was supratherapeutic at 5.3. Pt was advised to hold Coumadin x3 days then resume usual follow up with PCP.   It is fine for [place] to start managing pt's Coumadin. She is established with cardiology and takes Coumadin for cardiac indication. Milus Banister stated they check INRs on Mondays and Thursdays. Gave her a verbal order to recheck INR next Monday, 7/31 and call results to [place]. She will inform Dr. Nyoka Cowden that we will be managing pt's Coumadin. She will also call pt to clarify what dose of Coumadin pt is taking and will report this on Monday when she calls with INR result. Marland Kitchen

## 2016-06-28 NOTE — Telephone Encounter (Signed)
Therese with [facility] called and left message stating that they received orders back from our office yesterday and they need to draw INR on different day due to no labs on Friday. Also wanted to know the Current Coumadin dose.   In reviewing the patient's chart, Milus Banister also called the Cardiology office this morning and spoke with [facility] and requested that they start following patient's Coumadin instead because patient's INR is being followed every 4-6 months. Supple agreed to start managing patient's Coumadin and gave her verbal orders to recheck INR next Monday 7/31.

## 2016-06-28 NOTE — Telephone Encounter (Signed)
Spoke with Nancy Payne pt's nurse at [facility] regarding pt. Nancy Payne states she had already spoke with the Coumadin clinic here to arrange the F/u for her anti coag management here in this office for pt. Nancy Payne has no other other concerns.

## 2016-06-28 NOTE — Telephone Encounter (Signed)
Follow Up:    Please call,concerning Nancy Payne's office visit yesterday.

## 2016-06-28 NOTE — Telephone Encounter (Signed)
Follow Up:    Pt's office visit was Tuesday,not yesterday.

## 2016-06-29 ENCOUNTER — Telehealth: Payer: Self-pay

## 2016-06-29 DIAGNOSIS — L84 Corns and callosities: Secondary | ICD-10-CM | POA: Diagnosis not present

## 2016-06-29 DIAGNOSIS — L602 Onychogryphosis: Secondary | ICD-10-CM | POA: Diagnosis not present

## 2016-06-29 DIAGNOSIS — E1159 Type 2 diabetes mellitus with other circulatory complications: Secondary | ICD-10-CM | POA: Diagnosis not present

## 2016-06-29 NOTE — Telephone Encounter (Signed)
Spoke with patient and let her know that [facility] wanted Cardiology to follow her INR, patient stated that she is suppose to speak with [facility] this morning.  See phone note from 06/29/2016

## 2016-07-02 ENCOUNTER — Ambulatory Visit (INDEPENDENT_AMBULATORY_CARE_PROVIDER_SITE_OTHER): Payer: Medicare Other | Admitting: Pharmacist

## 2016-07-02 DIAGNOSIS — I481 Persistent atrial fibrillation: Secondary | ICD-10-CM

## 2016-07-02 DIAGNOSIS — I4819 Other persistent atrial fibrillation: Secondary | ICD-10-CM

## 2016-07-02 DIAGNOSIS — Z7901 Long term (current) use of anticoagulants: Secondary | ICD-10-CM | POA: Diagnosis not present

## 2016-07-02 DIAGNOSIS — I4891 Unspecified atrial fibrillation: Secondary | ICD-10-CM | POA: Diagnosis not present

## 2016-07-02 LAB — PROTIME-INR: INR: 1.4 — AB (ref 0.9–1.1)

## 2016-07-05 ENCOUNTER — Encounter: Payer: Self-pay | Admitting: Internal Medicine

## 2016-07-05 DIAGNOSIS — I481 Persistent atrial fibrillation: Secondary | ICD-10-CM | POA: Diagnosis not present

## 2016-07-05 DIAGNOSIS — E118 Type 2 diabetes mellitus with unspecified complications: Secondary | ICD-10-CM | POA: Diagnosis not present

## 2016-07-05 DIAGNOSIS — N182 Chronic kidney disease, stage 2 (mild): Secondary | ICD-10-CM | POA: Diagnosis not present

## 2016-07-05 DIAGNOSIS — I10 Essential (primary) hypertension: Secondary | ICD-10-CM | POA: Diagnosis not present

## 2016-07-05 LAB — BASIC METABOLIC PANEL
BUN: 33 mg/dL — AB (ref 4–21)
Creatinine: 1.3 mg/dL — AB (ref 0.5–1.1)
GLUCOSE: 155 mg/dL
Potassium: 4.7 mmol/L (ref 3.4–5.3)
SODIUM: 141 mmol/L (ref 137–147)

## 2016-07-05 LAB — HEMOGLOBIN A1C: HEMOGLOBIN A1C: 8.2

## 2016-07-05 LAB — MICROALBUMIN, URINE: Microalb, Ur: 2

## 2016-07-06 ENCOUNTER — Other Ambulatory Visit: Payer: Self-pay

## 2016-07-16 ENCOUNTER — Ambulatory Visit (INDEPENDENT_AMBULATORY_CARE_PROVIDER_SITE_OTHER): Payer: Medicare Other | Admitting: Pharmacist

## 2016-07-16 DIAGNOSIS — Z7901 Long term (current) use of anticoagulants: Secondary | ICD-10-CM | POA: Diagnosis not present

## 2016-07-16 DIAGNOSIS — I4891 Unspecified atrial fibrillation: Secondary | ICD-10-CM | POA: Diagnosis not present

## 2016-07-16 DIAGNOSIS — I481 Persistent atrial fibrillation: Secondary | ICD-10-CM

## 2016-07-16 DIAGNOSIS — I4819 Other persistent atrial fibrillation: Secondary | ICD-10-CM

## 2016-07-16 LAB — PROTIME-INR: INR: 3.6 — AB (ref 0.9–1.1)

## 2016-07-17 ENCOUNTER — Non-Acute Institutional Stay: Payer: Medicare Other | Admitting: Internal Medicine

## 2016-07-17 ENCOUNTER — Encounter: Payer: Self-pay | Admitting: Internal Medicine

## 2016-07-17 VITALS — BP 110/68 | HR 62 | Temp 97.6°F | Ht 63.0 in | Wt 124.0 lb

## 2016-07-17 DIAGNOSIS — IMO0002 Reserved for concepts with insufficient information to code with codable children: Secondary | ICD-10-CM

## 2016-07-17 DIAGNOSIS — R609 Edema, unspecified: Secondary | ICD-10-CM | POA: Diagnosis not present

## 2016-07-17 DIAGNOSIS — I10 Essential (primary) hypertension: Secondary | ICD-10-CM | POA: Diagnosis not present

## 2016-07-17 DIAGNOSIS — N183 Chronic kidney disease, stage 3 unspecified: Secondary | ICD-10-CM

## 2016-07-17 DIAGNOSIS — I481 Persistent atrial fibrillation: Secondary | ICD-10-CM

## 2016-07-17 DIAGNOSIS — E1165 Type 2 diabetes mellitus with hyperglycemia: Secondary | ICD-10-CM | POA: Diagnosis not present

## 2016-07-17 DIAGNOSIS — I5022 Chronic systolic (congestive) heart failure: Secondary | ICD-10-CM | POA: Diagnosis not present

## 2016-07-17 DIAGNOSIS — E1122 Type 2 diabetes mellitus with diabetic chronic kidney disease: Secondary | ICD-10-CM

## 2016-07-17 DIAGNOSIS — Z7901 Long term (current) use of anticoagulants: Secondary | ICD-10-CM | POA: Diagnosis not present

## 2016-07-17 DIAGNOSIS — E785 Hyperlipidemia, unspecified: Secondary | ICD-10-CM

## 2016-07-17 DIAGNOSIS — N182 Chronic kidney disease, stage 2 (mild): Secondary | ICD-10-CM | POA: Diagnosis not present

## 2016-07-17 DIAGNOSIS — I4819 Other persistent atrial fibrillation: Secondary | ICD-10-CM

## 2016-07-17 MED ORDER — METFORMIN HCL 500 MG PO TABS: ORAL_TABLET | ORAL | 3 refills | Status: DC

## 2016-07-17 NOTE — Progress Notes (Signed)
Facility      Place of Service: Clinic (12)     Allergies  Allergen Reactions  . Penicillins Other (See Comments)    Temperature went up to 104 F  . Penicillin G Benzathine & Proc Other (See Comments)    Unknown    Chief Complaint  Patient presents with  . Medical Management of Chronic Issues    4 month medication management blood sugar, CKD, CHF, blood pressure, A-Fib    HPI:  Uncontrolled type 2 diabetes mellitus with stage 2 chronic kidney disease, without long-term current use of insulin (HCC) - A1c high at 8.2. Using medications regularly.  Persistent atrial fibrillation (HCC) - chronic. Rate controlled and anticoagulated.  Chronic systolic congestive heart failure (HCC) - compensated  Hyperlipidemia - no recent lab  Essential hypertension - controlled  Edema, unspecified type - stable  Long term current use of anticoagulant therapy - stable  Kidney disease, chronic, stage III (GFR 30-59 ml/min) - unchanged    Medications: Patient's Medications  New Prescriptions   No medications on file  Previous Medications   CRESTOR 10 MG TABLET    TAKE ONE TABLET BY MOUTH ONCE DAILY TO CONTROL CHOLESTEROL   FUROSEMIDE (LASIX) 40 MG TABLET    Take 1.5 tablets (60 mg total) by mouth daily.   GLIMEPIRIDE (AMARYL) 2 MG TABLET    TAKE ONE TABLET BY MOUTH ONCE DAILY WITH BREAKFAST TO CONTROL BLOOD SUGAR   METFORMIN (GLUCOPHAGE) 500 MG TABLET    TAKE 1 TABLET EVERY DAY TO CONTROL GLUCOSE   METOPROLOL TARTRATE (LOPRESSOR) 25 MG TABLET    TAKE 1 TABLET BY MOUTH TWICE A DAY TO CONTROL BLOOD PRESSURE   MULTIPLE VITAMINS-MINERALS (MULTIVITAMIN PO)    Take 1 tablet by mouth daily.   SPIRONOLACTONE (ALDACTONE) 25 MG TABLET    TAKE 1 TABLET BY MOUTH EVERY DAY   UNABLE TO FIND    PT/INR Check on Friday 06/29/16, call or fax with results. Phone [phone], Fax [phone]   WARFARIN (COUMADIN) 5 MG TABLET    Take one tablet daily except M & F take 1/2 tablet  Modified Medications     No medications on file  Discontinued Medications   WARFARIN (COUMADIN) 5 MG TABLET    TAKE 1 TABLET BY MOUTH EVERY DAY FOR ANTICOAGULATION     Review of Systems  Constitutional: Positive for activity change and fatigue. Negative for appetite change, chills, fever and unexpected weight change.  HENT: Negative for congestion, ear pain, mouth sores, sinus pressure and sore throat.        Hx of allergic symptoms manifested by rhinorrhea, teary eyes, sneezing, coughing.  Respiratory: Negative for cough, chest tightness and shortness of breath.   Cardiovascular: Negative for chest pain, palpitations and leg swelling.       History of atrial fibrillation and aortic valvular stenosis. She is on chronic anticoagulation with warfarin for these problems.  Gastrointestinal: Negative for abdominal distention, abdominal pain, blood in stool, constipation, diarrhea and nausea.  Endocrine: Negative.   Genitourinary: Negative for decreased urine volume, difficulty urinating and dysuria.  Musculoskeletal: Positive for back pain and gait problem (Last fall 03/07/2015). Negative for joint swelling, myalgias, neck pain and neck stiffness.       Onset of pain in the left leg since 03/31/16. Seems to be better now.  Allergic/Immunologic: Negative.   Neurological: Negative for dizziness, tremors, seizures, syncope, facial asymmetry, speech difficulty, weakness, light-headedness and headaches.  Hematological: Negative.   Psychiatric/Behavioral: Negative for  agitation, confusion, decreased concentration, self-injury and sleep disturbance. The patient is not nervous/anxious and is not hyperactive.     Vitals:   07/17/16 0918  BP: 110/68  Pulse: 62  Temp: 97.6 F (36.4 C)  TempSrc: Oral  Weight: 124 lb (56.2 kg)  Height: _0  (1.6 m)   Wt Readings from Last 3 Encounters:  07/17/16 124 lb (56.2 kg)  06/26/16 124 lb (56.2 kg)  04/03/16 128 lb (58.1 kg)    Body mass index is 21.97 kg/m.  Physical Exam   Constitutional: She is oriented to person, place, and time. She appears well-developed. No distress.  HENT:  Head: Normocephalic and atraumatic.  Nose: Nose normal.  Xerostomia  Eyes: Conjunctivae and EOM are normal. Pupils are equal, round, and reactive to light. Left eye exhibits no discharge. No scleral icterus.  Xerophthalmia  Neck: Normal range of motion. Neck supple. No JVD present. No tracheal deviation present. No thyromegaly present.  Cardiovascular: Exam reveals no friction rub.   Murmur heard. 3/6 LSB SEM. Pacemaker in left upper chest. AF.  Pulmonary/Chest: No respiratory distress. She has no wheezes. She has no rales.  Abdominal: Bowel sounds are normal. She exhibits no distension and no mass. There is no tenderness.  Musculoskeletal: Normal range of motion. She exhibits no edema or tenderness.  Edematous legs bilaterally. Tender in the left calf and medial left thigh.  Lymphadenopathy:    She has no cervical adenopathy.  Neurological: She is alert and oriented to person, place, and time. She has normal reflexes. No cranial nerve deficit. Coordination normal.  Skin: Skin is dry. No rash noted. No erythema. No pallor.  Speckled red discoloration of both lower legs  Psychiatric: She has a normal mood and affect. Her behavior is normal. Thought content normal.     Labs reviewed: Lab Summary Latest Ref Rng & Units 07/05/2016 06/26/2016 03/12/2016 11/24/2015 11/14/2015  Hemoglobin 11.7 - 15.5 g/dL (None) 12.9 (None) (None) (None)  Hematocrit 35.0 - 45.0 % (None) 38.3 (None) (None) (None)  White count 3.8 - 10.8 K/uL (None) 6.4 (None) (None) (None)  Platelet count 140 - 400 K/uL (None) 192 (None) (None) (None)  Sodium 137 - 147 mmol/L 141 138 138 139 141  Potassium 3.4 - 5.3 mmol/L 4.7 4.5 4.5 4.3 4.7  Calcium 8.6 - 10.4 mg/dL (None) 9.0 (None) 9.0 (None)  Phosphorus - (None) (None) (None) (None) (None)  Creatinine 0.5 - 1.1 mg/dL 1.3(A) 1.29(H) 1.3(A) 1.31(H) 1.3(A)  AST 13 -  35 U/L (None) (None) 17 (None) 21  Alk Phos 25 - 125 U/L (None) (None) 69 (None) 64  Bilirubin - (None) (None) (None) (None) (None)  Glucose mg/dL 155 118(H) 152 183(H) 121  Cholesterol 0 - 200 mg/dL (None) (None) 111 (None) (None)  HDL cholesterol 35 - 70 mg/dL (None) (None) 41 (None) (None)  Triglycerides 40 - 160 mg/dL (None) (None) 131 (None) (None)  LDL Direct - (None) (None) (None) (None) (None)  LDL Calc mg/dL (None) (None) 44 (None) (None)  Total protein - (None) (None) (None) (None) (None)  Albumin - (None) (None) (None) (None) (None)  Some recent data might be hidden   Lab Results  Component Value Date   TSH 3.23 11/02/2013   Lab Results  Component Value Date   BUN 33 (A) 07/05/2016   BUN 40 (H) 06/26/2016   BUN 32 (A) 03/12/2016   Lab Results  Component Value Date   CREATININE 1.3 (A) 07/05/2016   CREATININE 1.29 (H) 06/26/2016   CREATININE  1.3 (A) 03/12/2016   Lab Results  Component Value Date   HGBA1C 8.2 07/05/2016   HGBA1C 8.2 03/12/2016   HGBA1C 8.3 (A) 11/14/2015       Assessment/Plan  1. Uncontrolled type 2 diabetes mellitus with stage 2 chronic kidney disease, without long-term current use of insulin (HCC) Increase metformin to 500 mg twice daily. Continue glimepiride 2 mg each morning. - BMP, A1c , future  2. Persistent atrial fibrillation (HCC) stable  3. Chronic systolic congestive heart failure (HCC) compensated  4. Hyperlipidemia  5. Essential hypertension -BMP, future  6. Edema, unspecified type improved  7. Long term current use of anticoagulant therapy stable  8. Kidney disease, chronic, stage III (GFR 30-59 ml/min) Stable -BMP, future

## 2016-07-23 ENCOUNTER — Ambulatory Visit (INDEPENDENT_AMBULATORY_CARE_PROVIDER_SITE_OTHER): Payer: Medicare Other

## 2016-07-23 DIAGNOSIS — I4819 Other persistent atrial fibrillation: Secondary | ICD-10-CM

## 2016-07-23 DIAGNOSIS — Z7901 Long term (current) use of anticoagulants: Secondary | ICD-10-CM | POA: Diagnosis not present

## 2016-07-23 DIAGNOSIS — I4891 Unspecified atrial fibrillation: Secondary | ICD-10-CM | POA: Diagnosis not present

## 2016-07-23 DIAGNOSIS — I481 Persistent atrial fibrillation: Secondary | ICD-10-CM

## 2016-07-23 LAB — PROTIME-INR: INR: 2.6 — AB (ref 0.9–1.1)

## 2016-07-24 ENCOUNTER — Telehealth: Payer: Self-pay | Admitting: *Deleted

## 2016-07-24 NOTE — Telephone Encounter (Signed)
Spoke with Milus Banister at [facility] & she stated that she received an order to recheck labs on 08/06/16 but their facility will be not be doing labs on that day due to being a holiday therefore gave orders to have pt INR checked on 08/09/16 on the next lab availability date. Pt was moved in our lab book to ensure lab will be done on 08/09/16.

## 2016-08-08 ENCOUNTER — Ambulatory Visit (INDEPENDENT_AMBULATORY_CARE_PROVIDER_SITE_OTHER): Payer: Medicare Other | Admitting: *Deleted

## 2016-08-08 ENCOUNTER — Telehealth: Payer: Self-pay | Admitting: Cardiology

## 2016-08-08 DIAGNOSIS — I495 Sick sinus syndrome: Secondary | ICD-10-CM

## 2016-08-08 NOTE — Telephone Encounter (Signed)
Spoke with pt and reminded pt of remote transmission that is due today. Pt verbalized understanding.   

## 2016-08-09 ENCOUNTER — Ambulatory Visit (INDEPENDENT_AMBULATORY_CARE_PROVIDER_SITE_OTHER): Payer: Medicare Other | Admitting: Cardiovascular Disease

## 2016-08-09 DIAGNOSIS — I4891 Unspecified atrial fibrillation: Secondary | ICD-10-CM | POA: Diagnosis not present

## 2016-08-09 DIAGNOSIS — Z7901 Long term (current) use of anticoagulants: Secondary | ICD-10-CM

## 2016-08-09 DIAGNOSIS — I481 Persistent atrial fibrillation: Secondary | ICD-10-CM

## 2016-08-09 DIAGNOSIS — I4819 Other persistent atrial fibrillation: Secondary | ICD-10-CM

## 2016-08-09 LAB — POCT INR: INR: 1.7

## 2016-08-09 NOTE — Progress Notes (Signed)
Remote pacemaker transmission.   

## 2016-08-10 ENCOUNTER — Encounter: Payer: Self-pay | Admitting: Cardiology

## 2016-08-18 LAB — CUP PACEART REMOTE DEVICE CHECK
Battery Remaining Longevity: 153 mo
Battery Voltage: 2.8 V
Brady Statistic AP VP Percent: 4 %
Date Time Interrogation Session: 20170906195403
Implantable Lead Location: 753859
Lead Channel Setting Pacing Pulse Width: 0.4 ms
Lead Channel Setting Sensing Sensitivity: 2 mV
MDC IDC LEAD IMPLANT DT: 20050920
MDC IDC LEAD IMPLANT DT: 20050920
MDC IDC LEAD LOCATION: 753860
MDC IDC MSMT BATTERY IMPEDANCE: 132 Ohm
MDC IDC MSMT LEADCHNL RA IMPEDANCE VALUE: 409 Ohm
MDC IDC MSMT LEADCHNL RV IMPEDANCE VALUE: 509 Ohm
MDC IDC MSMT LEADCHNL RV PACING THRESHOLD AMPLITUDE: 0.75 V
MDC IDC MSMT LEADCHNL RV PACING THRESHOLD PULSEWIDTH: 0.4 ms
MDC IDC SET LEADCHNL RA PACING AMPLITUDE: 2.5 V
MDC IDC SET LEADCHNL RV PACING AMPLITUDE: 2.5 V
MDC IDC STAT BRADY AP VS PERCENT: 0 %
MDC IDC STAT BRADY AS VP PERCENT: 23 %
MDC IDC STAT BRADY AS VS PERCENT: 73 %

## 2016-08-23 ENCOUNTER — Ambulatory Visit (INDEPENDENT_AMBULATORY_CARE_PROVIDER_SITE_OTHER): Payer: Medicare Other | Admitting: Cardiovascular Disease

## 2016-08-23 DIAGNOSIS — I4891 Unspecified atrial fibrillation: Secondary | ICD-10-CM | POA: Diagnosis not present

## 2016-08-23 DIAGNOSIS — I4819 Other persistent atrial fibrillation: Secondary | ICD-10-CM

## 2016-08-23 DIAGNOSIS — Z7901 Long term (current) use of anticoagulants: Secondary | ICD-10-CM

## 2016-08-23 DIAGNOSIS — I481 Persistent atrial fibrillation: Secondary | ICD-10-CM

## 2016-08-23 LAB — PROTIME-INR: INR: 1.9 — AB (ref 0.9–1.1)

## 2016-08-30 DIAGNOSIS — H353211 Exudative age-related macular degeneration, right eye, with active choroidal neovascularization: Secondary | ICD-10-CM | POA: Diagnosis not present

## 2016-09-06 ENCOUNTER — Ambulatory Visit (INDEPENDENT_AMBULATORY_CARE_PROVIDER_SITE_OTHER): Payer: Medicare Other | Admitting: Internal Medicine

## 2016-09-06 DIAGNOSIS — I4819 Other persistent atrial fibrillation: Secondary | ICD-10-CM

## 2016-09-06 DIAGNOSIS — I481 Persistent atrial fibrillation: Secondary | ICD-10-CM

## 2016-09-06 DIAGNOSIS — I4891 Unspecified atrial fibrillation: Secondary | ICD-10-CM | POA: Diagnosis not present

## 2016-09-06 DIAGNOSIS — Z7901 Long term (current) use of anticoagulants: Secondary | ICD-10-CM | POA: Diagnosis not present

## 2016-09-06 LAB — PROTIME-INR: INR: 5.2 — AB (ref 0.9–1.1)

## 2016-09-13 ENCOUNTER — Ambulatory Visit (INDEPENDENT_AMBULATORY_CARE_PROVIDER_SITE_OTHER): Payer: Medicare Other | Admitting: Internal Medicine

## 2016-09-13 DIAGNOSIS — I4819 Other persistent atrial fibrillation: Secondary | ICD-10-CM

## 2016-09-13 DIAGNOSIS — I481 Persistent atrial fibrillation: Secondary | ICD-10-CM

## 2016-09-13 DIAGNOSIS — Z7901 Long term (current) use of anticoagulants: Secondary | ICD-10-CM

## 2016-09-13 DIAGNOSIS — I4891 Unspecified atrial fibrillation: Secondary | ICD-10-CM | POA: Diagnosis not present

## 2016-09-13 LAB — PROTIME-INR: INR: 1.7 — AB (ref 0.9–1.1)

## 2016-09-20 ENCOUNTER — Ambulatory Visit (INDEPENDENT_AMBULATORY_CARE_PROVIDER_SITE_OTHER): Payer: Medicare Other

## 2016-09-20 DIAGNOSIS — I4891 Unspecified atrial fibrillation: Secondary | ICD-10-CM | POA: Diagnosis not present

## 2016-09-20 DIAGNOSIS — I4819 Other persistent atrial fibrillation: Secondary | ICD-10-CM

## 2016-09-20 DIAGNOSIS — Z7901 Long term (current) use of anticoagulants: Secondary | ICD-10-CM

## 2016-09-20 DIAGNOSIS — I481 Persistent atrial fibrillation: Secondary | ICD-10-CM

## 2016-09-20 LAB — PROTIME-INR: INR: 2.8 — AB (ref 0.9–1.1)

## 2016-09-21 DIAGNOSIS — Z23 Encounter for immunization: Secondary | ICD-10-CM | POA: Diagnosis not present

## 2016-09-26 ENCOUNTER — Other Ambulatory Visit: Payer: Self-pay

## 2016-09-26 DIAGNOSIS — E1165 Type 2 diabetes mellitus with hyperglycemia: Secondary | ICD-10-CM

## 2016-09-26 DIAGNOSIS — I10 Essential (primary) hypertension: Secondary | ICD-10-CM

## 2016-09-26 DIAGNOSIS — IMO0002 Reserved for concepts with insufficient information to code with codable children: Secondary | ICD-10-CM

## 2016-09-26 DIAGNOSIS — E1121 Type 2 diabetes mellitus with diabetic nephropathy: Secondary | ICD-10-CM

## 2016-09-30 ENCOUNTER — Other Ambulatory Visit: Payer: Self-pay | Admitting: Internal Medicine

## 2016-10-04 ENCOUNTER — Ambulatory Visit (INDEPENDENT_AMBULATORY_CARE_PROVIDER_SITE_OTHER): Payer: Medicare Other | Admitting: Cardiology

## 2016-10-04 DIAGNOSIS — Z7901 Long term (current) use of anticoagulants: Secondary | ICD-10-CM

## 2016-10-04 DIAGNOSIS — I4891 Unspecified atrial fibrillation: Secondary | ICD-10-CM | POA: Diagnosis not present

## 2016-10-04 DIAGNOSIS — I481 Persistent atrial fibrillation: Secondary | ICD-10-CM

## 2016-10-04 DIAGNOSIS — I4819 Other persistent atrial fibrillation: Secondary | ICD-10-CM

## 2016-10-04 LAB — PROTIME-INR: INR: 3 — AB (ref 0.9–1.1)

## 2016-10-05 ENCOUNTER — Other Ambulatory Visit: Payer: Self-pay

## 2016-10-05 DIAGNOSIS — E1122 Type 2 diabetes mellitus with diabetic chronic kidney disease: Secondary | ICD-10-CM

## 2016-10-05 DIAGNOSIS — IMO0002 Reserved for concepts with insufficient information to code with codable children: Secondary | ICD-10-CM

## 2016-10-05 DIAGNOSIS — E1165 Type 2 diabetes mellitus with hyperglycemia: Principal | ICD-10-CM

## 2016-10-05 DIAGNOSIS — N182 Chronic kidney disease, stage 2 (mild): Principal | ICD-10-CM

## 2016-10-05 MED ORDER — METFORMIN HCL 500 MG PO TABS: ORAL_TABLET | ORAL | 0 refills | Status: DC

## 2016-10-05 MED ORDER — FUROSEMIDE 40 MG PO TABS: 60.0000 mg | ORAL_TABLET | Freq: Every day | ORAL | 3 refills | Status: DC

## 2016-10-05 NOTE — Telephone Encounter (Signed)
[name] aware rx's sent to pharmacy

## 2016-10-09 ENCOUNTER — Other Ambulatory Visit: Payer: Self-pay | Admitting: *Deleted

## 2016-10-18 DIAGNOSIS — Z7901 Long term (current) use of anticoagulants: Secondary | ICD-10-CM | POA: Diagnosis not present

## 2016-10-18 DIAGNOSIS — I4891 Unspecified atrial fibrillation: Secondary | ICD-10-CM | POA: Diagnosis not present

## 2016-10-18 LAB — PROTIME-INR: INR: 2.2 — AB (ref 0.9–1.1)

## 2016-10-19 ENCOUNTER — Ambulatory Visit (INDEPENDENT_AMBULATORY_CARE_PROVIDER_SITE_OTHER): Payer: Medicare Other | Admitting: Cardiology

## 2016-10-19 DIAGNOSIS — I481 Persistent atrial fibrillation: Secondary | ICD-10-CM

## 2016-10-19 DIAGNOSIS — I4819 Other persistent atrial fibrillation: Secondary | ICD-10-CM

## 2016-10-19 DIAGNOSIS — Z7901 Long term (current) use of anticoagulants: Secondary | ICD-10-CM

## 2016-10-29 ENCOUNTER — Encounter: Payer: Self-pay | Admitting: Internal Medicine

## 2016-11-01 DIAGNOSIS — H353211 Exudative age-related macular degeneration, right eye, with active choroidal neovascularization: Secondary | ICD-10-CM | POA: Diagnosis not present

## 2016-11-05 ENCOUNTER — Other Ambulatory Visit: Payer: Self-pay

## 2016-11-05 ENCOUNTER — Ambulatory Visit (INDEPENDENT_AMBULATORY_CARE_PROVIDER_SITE_OTHER): Payer: Medicare Other | Admitting: Internal Medicine

## 2016-11-05 ENCOUNTER — Encounter: Payer: Self-pay | Admitting: Internal Medicine

## 2016-11-05 VITALS — BP 130/74 | HR 84 | Ht 62.5 in | Wt 119.2 lb

## 2016-11-05 DIAGNOSIS — I495 Sick sinus syndrome: Secondary | ICD-10-CM | POA: Diagnosis not present

## 2016-11-05 DIAGNOSIS — IMO0002 Reserved for concepts with insufficient information to code with codable children: Secondary | ICD-10-CM

## 2016-11-05 DIAGNOSIS — E1165 Type 2 diabetes mellitus with hyperglycemia: Secondary | ICD-10-CM

## 2016-11-05 DIAGNOSIS — I480 Paroxysmal atrial fibrillation: Secondary | ICD-10-CM

## 2016-11-05 DIAGNOSIS — E1129 Type 2 diabetes mellitus with other diabetic kidney complication: Secondary | ICD-10-CM | POA: Diagnosis not present

## 2016-11-05 DIAGNOSIS — Z950 Presence of cardiac pacemaker: Secondary | ICD-10-CM | POA: Diagnosis not present

## 2016-11-05 DIAGNOSIS — E1121 Type 2 diabetes mellitus with diabetic nephropathy: Secondary | ICD-10-CM

## 2016-11-05 DIAGNOSIS — I10 Essential (primary) hypertension: Secondary | ICD-10-CM

## 2016-11-05 LAB — CUP PACEART INCLINIC DEVICE CHECK
Battery Impedance: 132 Ohm
Brady Statistic AP VP Percent: 31 %
Brady Statistic AP VS Percent: 0 %
Brady Statistic AS VS Percent: 59 %
Implantable Lead Implant Date: 20050920
Implantable Lead Implant Date: 20050920
Implantable Lead Location: 753859
Implantable Lead Model: 5076
Lead Channel Impedance Value: 501 Ohm
Lead Channel Pacing Threshold Amplitude: 0.5 V
Lead Channel Pacing Threshold Amplitude: 1 V
Lead Channel Sensing Intrinsic Amplitude: 1 mV
Lead Channel Sensing Intrinsic Amplitude: 4 mV
Lead Channel Setting Pacing Amplitude: 2.5 V
Lead Channel Setting Pacing Pulse Width: 0.4 ms
Lead Channel Setting Sensing Sensitivity: 2 mV
MDC IDC LEAD LOCATION: 753860
MDC IDC MSMT BATTERY REMAINING LONGEVITY: 138 mo
MDC IDC MSMT BATTERY VOLTAGE: 2.79 V
MDC IDC MSMT LEADCHNL RA IMPEDANCE VALUE: 404 Ohm
MDC IDC MSMT LEADCHNL RA PACING THRESHOLD PULSEWIDTH: 0.4 ms
MDC IDC MSMT LEADCHNL RV PACING THRESHOLD PULSEWIDTH: 0.4 ms
MDC IDC PG IMPLANT DT: 20150817
MDC IDC SESS DTM: 20171204191936
MDC IDC SET LEADCHNL RV PACING AMPLITUDE: 2.5 V
MDC IDC STAT BRADY AS VP PERCENT: 11 %

## 2016-11-05 LAB — BASIC METABOLIC PANEL
BUN: 31 mg/dL — AB (ref 4–21)
Creatinine: 1.2 mg/dL — AB (ref 0.5–1.1)
Glucose: 138 mg/dL
POTASSIUM: 4.4 mmol/L (ref 3.4–5.3)
SODIUM: 139 mmol/L (ref 137–147)

## 2016-11-05 LAB — HEMOGLOBIN A1C: HEMOGLOBIN A1C: 7

## 2016-11-05 NOTE — Patient Instructions (Signed)
Medication Instructions:  Your physician recommends that you continue on your current medications as directed. Please refer to the Current Medication list given to you today.   Labwork: None ordered   Testing/Procedures: None ordered   Follow-Up: Your physician wants you to follow-up in: 12 months with Chanetta Marshall, NP You will receive a reminder letter in the mail two months in advance. If you don't receive a letter, please call our office to schedule the follow-up appointment.   Remote monitoring is used to monitor your Pacemaker from home. This monitoring reduces the number of office visits required to check your device to one time per year. It allows [place] to keep an eye on the functioning of your device to ensure it is working properly. You are scheduled for a device check from home on 02/04/17. You may send your transmission at any time that day. If you have a wireless device, the transmission will be sent automatically. After your physician reviews your transmission, you will receive a postcard with your next transmission date.    Any Other Special Instructions Will Be Listed Below (If Applicable).     If you need a refill on your cardiac medications before your next appointment, please call your pharmacy.

## 2016-11-05 NOTE — Progress Notes (Signed)
PCP:  Jeanmarie Hubert, MD Primary Cardiologist:  Dr Martinique  The patient presents today for routine electrophysiology followup.  Since last being seen in our clinic, the patient reports doing very well.  She remains asymptomatic with her atrial arrhythmias.  Today, she denies symptoms of palpitations, chest pain, shortness of breath, orthopnea, PND,  dizziness, presyncope, syncope, or neurologic sequela.  Her edema is stable  The patient feels that she is tolerating medications without difficulties and is otherwise without complaint today.   Past Medical History:  Diagnosis Date  . Atrial fibrillation ([place])    persistent afib/ atrial flutter  . Brain atrophy 2009  . Congestive heart failure (CHF) ([place])   . Eczema   . Edema   . Hyperlipidemia   . Hypertension   . Kidney disease, chronic, stage III (GFR 30-59 ml/min)   . Macular degeneration 2012   left   . Sick sinus syndrome ([place])    post DDD pacemaker  . Skin cancer of nose 2012   with skin graft  . Type II or unspecified type diabetes mellitus with renal manifestations, not stated as uncontrolled(250.40)    type 2  . Xerophthalmia 2012  . Xerostomia 2012   Past Surgical History:  Procedure Laterality Date  . ANKLE SURGERY Right 1990   fracture as steel plate   . CARDIAC VALVE SURGERY  2005   Dr. Ricard Dillon  . CATARACT EXTRACTION W/ INTRAOCULAR LENS IMPLANT Left 11/11/14   [place]  2008  . EYE SURGERY    . KNEE SURGERY Right 1990   fracture has plate and rod in femur  . PACEMAKER GENERATOR CHANGE  07/19/2014   Dr. Rayann Heman MDT  . PACEMAKER INSERTION  2005; 07-19-14   MDT ADDRL1 pacemaker generator change by Dr Rayann Heman [id number]  . PERMANENT PACEMAKER GENERATOR CHANGE N/A 07/19/2014   Procedure: PERMANENT PACEMAKER GENERATOR CHANGE;  Surgeon: Coralyn Mark, MD;  Location: [facility];  Service: Cardiovascular;  Laterality: N/A;    Current Outpatient Prescriptions  Medication Sig Dispense Refill  . CRESTOR  10 MG tablet TAKE ONE TABLET BY MOUTH ONCE DAILY TO CONTROL CHOLESTEROL 90 tablet 1  . furosemide (LASIX) 40 MG tablet Take 1.5 tablets (60 mg total) by mouth daily. 135 tablet 3  . glimepiride (AMARYL) 2 MG tablet TAKE ONE TABLET BY MOUTH ONCE DAILY WITH BREAKFAST TO CONTROL BLOOD SUGAR 90 tablet 3  . metFORMIN (GLUCOPHAGE) 500 MG tablet TAKE 1 TABLET TWICE DAILY TO CONTROL GLUCOSE 180 tablet 0  . metoprolol tartrate (LOPRESSOR) 25 MG tablet TAKE 1 TABLET BY MOUTH TWICE A DAY TO CONTROL BLOOD PRESSURE 180 tablet 3  . Multiple Vitamins-Minerals (MULTIVITAMIN PO) Take 1 tablet by mouth daily.    Marland Kitchen spironolactone (ALDACTONE) 25 MG tablet TAKE 1 TABLET BY MOUTH EVERY DAY 90 tablet 1  . UNABLE TO FIND PT/INR Check on Friday 06/29/16, call or fax with results. Phone [phone], Fax [phone] 1 each 0  . warfarin (COUMADIN) 5 MG tablet Take one tablet daily except M & F take 1/2 tablet     No current facility-administered medications for this visit.     Allergies  Allergen Reactions  . Penicillins Other (See Comments)    Temperature went up to 104 F  . Penicillin G Benzathine & Proc Other (See Comments)    Unknown    Social History   Social History  . Marital status: Widowed    Spouse name: Marcello Moores  . Number of children:  N/A  . Years of education: N/A   Occupational History  . retired Personal assistant Retired   Social History Main Topics  . Smoking status: Never Smoker  . Smokeless tobacco: Never Used  . Alcohol use No  . Drug use: No  . Sexual activity: No   Other Topics Concern  . Not on file   Social History Narrative   Moved to [facility] 08/30/2012   Widowed 2013   Walks with walker   Exercise walking   Never smoked   Alcohol none   POA, Living Will          Family History  Problem Relation Age of Onset  . Stroke Mother   . Cancer Brother   . Anuerysm Sister     ROS-  All systems are reviewed and are negative except as outlined in the HPI  above   Physical Exam: Vitals:   11/05/16 1247  BP: 130/74  Pulse: 84  Weight: 119 lb 3.2 oz (54.1 kg)  Height: 5' 2.5" (1.588 m)    GEN- The patient is well appearing, alert and oriented x 3 today.   Head- normocephalic, atraumatic Eyes-  Sclera clear, conjunctiva pink Ears- hearing intact Oropharynx- clear Neck- supple, Lungs- Clear to ausculation bilaterally, normal work of breathing Chest- pacemaker pocket is well healed Heart- regular rate and rhythm  GI- soft, NT, ND, + BS Extremities- no clubbing, cyanosis, + venous stasis changes Neuro- strength and sensation are intact  Pacemaker interrogation- reviewed in detail today,  See PACEART report  Assessment and Plan:  1. Afib/ atypical flutter In sinus rhythm today No changes On coumadin  2. Sick sinus syndrome Normal pacemaker function See Pace Art report No changes today  3. HTN Stable No change required today   carelink Return to see me or my NP in 1 year  Thompson Grayer MD, [facility] 11/05/2016 1:25 PM

## 2016-11-12 ENCOUNTER — Encounter: Payer: Medicare Other | Admitting: Internal Medicine

## 2016-11-13 ENCOUNTER — Encounter: Payer: Self-pay | Admitting: Internal Medicine

## 2016-11-13 ENCOUNTER — Non-Acute Institutional Stay: Payer: Medicare Other | Admitting: Internal Medicine

## 2016-11-13 DIAGNOSIS — N183 Chronic kidney disease, stage 3 unspecified: Secondary | ICD-10-CM

## 2016-11-13 DIAGNOSIS — Z7901 Long term (current) use of anticoagulants: Secondary | ICD-10-CM

## 2016-11-13 DIAGNOSIS — E1165 Type 2 diabetes mellitus with hyperglycemia: Secondary | ICD-10-CM

## 2016-11-13 DIAGNOSIS — R609 Edema, unspecified: Secondary | ICD-10-CM | POA: Diagnosis not present

## 2016-11-13 DIAGNOSIS — E785 Hyperlipidemia, unspecified: Secondary | ICD-10-CM | POA: Diagnosis not present

## 2016-11-13 DIAGNOSIS — N182 Chronic kidney disease, stage 2 (mild): Secondary | ICD-10-CM

## 2016-11-13 DIAGNOSIS — I10 Essential (primary) hypertension: Secondary | ICD-10-CM | POA: Diagnosis not present

## 2016-11-13 DIAGNOSIS — E1122 Type 2 diabetes mellitus with diabetic chronic kidney disease: Secondary | ICD-10-CM

## 2016-11-13 DIAGNOSIS — I5022 Chronic systolic (congestive) heart failure: Secondary | ICD-10-CM

## 2016-11-13 DIAGNOSIS — I481 Persistent atrial fibrillation: Secondary | ICD-10-CM | POA: Diagnosis not present

## 2016-11-13 DIAGNOSIS — IMO0002 Reserved for concepts with insufficient information to code with codable children: Secondary | ICD-10-CM

## 2016-11-13 DIAGNOSIS — I4819 Other persistent atrial fibrillation: Secondary | ICD-10-CM

## 2016-11-13 NOTE — Progress Notes (Signed)
Progress Note      Facility  [facility]    Place of Service: Clinic (12)     Allergies  Allergen Reactions  . Penicillins Other (See Comments)    Temperature went up to 104 F  . Penicillin G Benzathine & Proc Other (See Comments)    Unknown    Chief Complaint  Patient presents with  . Medical Management of Chronic Issues    4 month medication management CKD, CHF, A-fIb, blood sugar, blood pressure    HPI:  Uncontrolled type 2 diabetes mellitus with stage 2 chronic kidney disease, without long-term current use of insulin (HCC) - A1c 7.0. Imroved control  Hyperlipidemia, unspecified hyperlipidemia type - no recent lab  Hypertension, unspecified type -  controlled  Kidney disease, chronic, stage III (GFR 30-59 ml/min) - stable  Long term current use of anticoagulant therapy - therapeutic INR  Chronic systolic congestive heart failure (HCC) - compensated  Persistent atrial fibrillation (HCC) - rate controlled and anticoagulated  Edema, unspecified type - 1-2+ bipedal   Medications: Patient's Medications  New Prescriptions   No medications on file  Previous Medications   CRESTOR 10 MG TABLET    TAKE ONE TABLET BY MOUTH ONCE DAILY TO CONTROL CHOLESTEROL   FUROSEMIDE (LASIX) 40 MG TABLET    Take 1.5 tablets (60 mg total) by mouth daily.   GLIMEPIRIDE (AMARYL) 2 MG TABLET    TAKE ONE TABLET BY MOUTH ONCE DAILY WITH BREAKFAST TO CONTROL BLOOD SUGAR   METFORMIN (GLUCOPHAGE) 500 MG TABLET    TAKE 1 TABLET TWICE DAILY TO CONTROL GLUCOSE   METOPROLOL TARTRATE (LOPRESSOR) 25 MG TABLET    TAKE 1 TABLET BY MOUTH TWICE A DAY TO CONTROL BLOOD PRESSURE   MULTIPLE VITAMINS-MINERALS (MULTIVITAMIN PO)    Take 1 tablet by mouth daily.   SPIRONOLACTONE (ALDACTONE) 25 MG TABLET    TAKE 1 TABLET BY MOUTH EVERY DAY   UNABLE TO FIND    PT/INR Check on Friday 06/29/16, call or fax with results. Phone [phone], Fax [phone]   WARFARIN (COUMADIN) 5 MG TABLET    Take one tablet daily  except  Friday  take 1/2 tablet  Modified Medications   No medications on file  Discontinued Medications   No medications on file     Review of Systems  Constitutional: Positive for activity change and fatigue. Negative for appetite change, chills, fever and unexpected weight change.  HENT: Negative for congestion, ear pain, mouth sores, sinus pressure and sore throat.        Hx of allergic symptoms manifested by rhinorrhea, teary eyes, sneezing, coughing.  Respiratory: Negative for cough, chest tightness and shortness of breath.   Cardiovascular: Negative for chest pain, palpitations and leg swelling.       History of atrial fibrillation and aortic valvular stenosis. She is on chronic anticoagulation with warfarin for these problems.  Gastrointestinal: Negative for abdominal distention, abdominal pain, blood in stool, constipation, diarrhea and nausea.  Endocrine: Negative.   Genitourinary: Negative for decreased urine volume, difficulty urinating and dysuria.  Musculoskeletal: Positive for back pain and gait problem (Last fall 03/07/2015). Negative for joint swelling, myalgias, neck pain and neck stiffness.       Onset of pain in the left leg since 03/31/16. Seems to be better now.  Allergic/Immunologic: Negative.   Neurological: Negative for dizziness, tremors, seizures, syncope, facial asymmetry, speech difficulty, weakness, light-headedness and headaches.  Hematological: Negative.   Psychiatric/Behavioral: Negative for agitation, confusion, decreased concentration, self-injury and  sleep disturbance. The patient is not nervous/anxious and is not hyperactive.     There were no vitals filed for this visit. Wt Readings from Last 3 Encounters:  11/05/16 119 lb 3.2 oz (54.1 kg)  07/17/16 124 lb (56.2 kg)  06/26/16 124 lb (56.2 kg)    There is no height or weight on file to calculate BMI.  Physical Exam  Constitutional: She is oriented to person, place, and time. She appears  well-developed. No distress.  HENT:  Head: Normocephalic and atraumatic.  Nose: Nose normal.  Xerostomia  Eyes: Conjunctivae and EOM are normal. Pupils are equal, round, and reactive to light. Left eye exhibits no discharge. No scleral icterus.  Xerophthalmia  Neck: Normal range of motion. Neck supple. No JVD present. No tracheal deviation present. No thyromegaly present.  Cardiovascular: Exam reveals no friction rub.   Murmur heard. 3/6 LSB SEM. Pacemaker in left upper chest. AF.  Pulmonary/Chest: No respiratory distress. She has no wheezes. She has no rales.  Abdominal: Bowel sounds are normal. She exhibits no distension and no mass. There is no tenderness.  Musculoskeletal: Normal range of motion. She exhibits edema (1-2+ bipedal). She exhibits no tenderness.  Edematous legs bilaterally. Tender in the left calf and medial left thigh.  Lymphadenopathy:    She has no cervical adenopathy.  Neurological: She is alert and oriented to person, place, and time. She has normal reflexes. No cranial nerve deficit. Coordination normal.  Skin: Skin is dry. No rash noted. No erythema. No pallor.  Speckled red discoloration of both lower legs  Psychiatric: She has a normal mood and affect. Her behavior is normal. Thought content normal.     Labs reviewed: Lab Summary Latest Ref Rng & Units 11/05/2016 07/05/2016 06/26/2016 03/12/2016  Hemoglobin 11.7 - 15.5 g/dL (None) (None) 12.9 (None)  Hematocrit 35.0 - 45.0 % (None) (None) 38.3 (None)  White count 3.8 - 10.8 K/uL (None) (None) 6.4 (None)  Platelet count 140 - 400 K/uL (None) (None) 192 (None)  Sodium 137 - 147 mmol/L 139 141 138 138  Potassium 3.4 - 5.3 mmol/L 4.4 4.7 4.5 4.5  Calcium 8.6 - 10.4 mg/dL (None) (None) 9.0 (None)  Phosphorus - (None) (None) (None) (None)  Creatinine 0.5 - 1.1 mg/dL 1.2(A) 1.3(A) 1.29(H) 1.3(A)  AST 13 - 35 U/L (None) (None) (None) 17  Alk Phos 25 - 125 U/L (None) (None) (None) 69  Bilirubin - (None) (None) (None)  (None)  Glucose mg/dL 138 155 118(H) 152  Cholesterol 0 - 200 mg/dL (None) (None) (None) 111  HDL cholesterol 35 - 70 mg/dL (None) (None) (None) 41  Triglycerides 40 - 160 mg/dL (None) (None) (None) 131  LDL Direct - (None) (None) (None) (None)  LDL Calc mg/dL (None) (None) (None) 44  Total protein - (None) (None) (None) (None)  Albumin - (None) (None) (None) (None)  Some recent data might be hidden   Lab Results  Component Value Date   TSH 3.23 11/02/2013   Lab Results  Component Value Date   BUN 31 (A) 11/05/2016   BUN 33 (A) 07/05/2016   BUN 40 (H) 06/26/2016   Lab Results  Component Value Date   CREATININE 1.2 (A) 11/05/2016   CREATININE 1.3 (A) 07/05/2016   CREATININE 1.29 (H) 06/26/2016   Lab Results  Component Value Date   HGBA1C 7.0 11/05/2016   HGBA1C 8.2 07/05/2016   HGBA1C 8.2 03/12/2016       Assessment/Plan  1. Uncontrolled type 2 diabetes mellitus with stage 2  chronic kidney disease, without long-term current use of insulin (HCC) Adequately controlled - Hemoglobin A1c; Future - Comprehensive metabolic panel; Future  2. Hyperlipidemia, unspecified hyperlipidemia type - Lipid panel; Future  3. Hypertension, unspecified type - Comprehensive metabolic panel; Future  4. Kidney disease, chronic, stage III (GFR 30-59 ml/min) - Comprehensive metabolic panel; Future  5. Long term current use of anticoagulant therapy Continue warfariun  6. Chronic systolic congestive heart failure (HCC) compensated  7. Persistent atrial fibrillation (HCC) Controlled rate  8. Edema, unspecified type unchanged

## 2016-11-15 DIAGNOSIS — Z7901 Long term (current) use of anticoagulants: Secondary | ICD-10-CM | POA: Diagnosis not present

## 2016-11-15 DIAGNOSIS — I4891 Unspecified atrial fibrillation: Secondary | ICD-10-CM | POA: Diagnosis not present

## 2016-11-15 LAB — PROTIME-INR: INR: 1.9 — AB (ref 0.9–1.1)

## 2016-11-16 ENCOUNTER — Ambulatory Visit (INDEPENDENT_AMBULATORY_CARE_PROVIDER_SITE_OTHER): Payer: Medicare Other | Admitting: Cardiology

## 2016-11-16 DIAGNOSIS — I4819 Other persistent atrial fibrillation: Secondary | ICD-10-CM

## 2016-11-16 DIAGNOSIS — I481 Persistent atrial fibrillation: Secondary | ICD-10-CM

## 2016-11-16 DIAGNOSIS — Z7901 Long term (current) use of anticoagulants: Secondary | ICD-10-CM

## 2016-12-06 ENCOUNTER — Ambulatory Visit (INDEPENDENT_AMBULATORY_CARE_PROVIDER_SITE_OTHER): Payer: Medicare Other | Admitting: Cardiovascular Disease

## 2016-12-06 DIAGNOSIS — I4819 Other persistent atrial fibrillation: Secondary | ICD-10-CM

## 2016-12-06 DIAGNOSIS — I4891 Unspecified atrial fibrillation: Secondary | ICD-10-CM | POA: Diagnosis not present

## 2016-12-06 DIAGNOSIS — I481 Persistent atrial fibrillation: Secondary | ICD-10-CM

## 2016-12-06 DIAGNOSIS — Z7901 Long term (current) use of anticoagulants: Secondary | ICD-10-CM

## 2016-12-06 LAB — PROTIME-INR: INR: 1.9 — AB (ref 0.9–1.1)

## 2016-12-16 ENCOUNTER — Other Ambulatory Visit: Payer: Self-pay | Admitting: Internal Medicine

## 2016-12-24 ENCOUNTER — Ambulatory Visit (INDEPENDENT_AMBULATORY_CARE_PROVIDER_SITE_OTHER): Payer: Medicare Other | Admitting: Cardiovascular Disease

## 2016-12-24 DIAGNOSIS — Z7901 Long term (current) use of anticoagulants: Secondary | ICD-10-CM | POA: Diagnosis not present

## 2016-12-24 DIAGNOSIS — I4891 Unspecified atrial fibrillation: Secondary | ICD-10-CM | POA: Diagnosis not present

## 2016-12-24 DIAGNOSIS — I481 Persistent atrial fibrillation: Secondary | ICD-10-CM

## 2016-12-24 DIAGNOSIS — I4819 Other persistent atrial fibrillation: Secondary | ICD-10-CM

## 2016-12-24 LAB — PROTIME-INR: INR: 2.4 — AB (ref 0.9–1.1)

## 2016-12-31 ENCOUNTER — Other Ambulatory Visit: Payer: Self-pay | Admitting: Internal Medicine

## 2016-12-31 DIAGNOSIS — E1165 Type 2 diabetes mellitus with hyperglycemia: Principal | ICD-10-CM

## 2016-12-31 DIAGNOSIS — N182 Chronic kidney disease, stage 2 (mild): Principal | ICD-10-CM

## 2016-12-31 DIAGNOSIS — E1122 Type 2 diabetes mellitus with diabetic chronic kidney disease: Secondary | ICD-10-CM

## 2016-12-31 DIAGNOSIS — IMO0002 Reserved for concepts with insufficient information to code with codable children: Secondary | ICD-10-CM

## 2017-01-03 DIAGNOSIS — Z961 Presence of intraocular lens: Secondary | ICD-10-CM | POA: Diagnosis not present

## 2017-01-03 DIAGNOSIS — H353123 Nonexudative age-related macular degeneration, left eye, advanced atrophic without subfoveal involvement: Secondary | ICD-10-CM | POA: Diagnosis not present

## 2017-01-03 DIAGNOSIS — H353211 Exudative age-related macular degeneration, right eye, with active choroidal neovascularization: Secondary | ICD-10-CM | POA: Diagnosis not present

## 2017-01-11 DIAGNOSIS — L84 Corns and callosities: Secondary | ICD-10-CM | POA: Diagnosis not present

## 2017-01-11 DIAGNOSIS — E1159 Type 2 diabetes mellitus with other circulatory complications: Secondary | ICD-10-CM | POA: Diagnosis not present

## 2017-01-11 DIAGNOSIS — L602 Onychogryphosis: Secondary | ICD-10-CM | POA: Diagnosis not present

## 2017-01-14 ENCOUNTER — Ambulatory Visit (INDEPENDENT_AMBULATORY_CARE_PROVIDER_SITE_OTHER): Payer: Medicare Other | Admitting: Cardiology

## 2017-01-14 DIAGNOSIS — Z7901 Long term (current) use of anticoagulants: Secondary | ICD-10-CM | POA: Diagnosis not present

## 2017-01-14 DIAGNOSIS — I4891 Unspecified atrial fibrillation: Secondary | ICD-10-CM | POA: Diagnosis not present

## 2017-01-14 DIAGNOSIS — I4819 Other persistent atrial fibrillation: Secondary | ICD-10-CM

## 2017-01-14 DIAGNOSIS — I481 Persistent atrial fibrillation: Secondary | ICD-10-CM

## 2017-01-14 LAB — PROTIME-INR: INR: 2.5 — AB (ref 0.9–1.1)

## 2017-02-04 ENCOUNTER — Telehealth: Payer: Self-pay | Admitting: Cardiology

## 2017-02-04 ENCOUNTER — Ambulatory Visit (INDEPENDENT_AMBULATORY_CARE_PROVIDER_SITE_OTHER): Payer: Medicare Other | Admitting: *Deleted

## 2017-02-04 DIAGNOSIS — I495 Sick sinus syndrome: Secondary | ICD-10-CM

## 2017-02-04 NOTE — Telephone Encounter (Signed)
Spoke with pt and reminded pt of remote transmission that is due today. Pt verbalized understanding.   

## 2017-02-07 ENCOUNTER — Encounter: Payer: Self-pay | Admitting: Cardiology

## 2017-02-07 NOTE — Progress Notes (Signed)
Remote pacemaker transmission.   

## 2017-02-08 LAB — CUP PACEART REMOTE DEVICE CHECK
Brady Statistic AP VS Percent: 0 %
Brady Statistic AS VP Percent: 5 %
Brady Statistic AS VS Percent: 1 %
Date Time Interrogation Session: 20180306230106
Implantable Lead Implant Date: 20050920
Implantable Lead Implant Date: 20050920
Implantable Lead Location: 753859
Implantable Lead Model: 5076
Lead Channel Impedance Value: 374 Ohm
Lead Channel Pacing Threshold Amplitude: 1 V
Lead Channel Pacing Threshold Pulse Width: 0.4 ms
Lead Channel Setting Pacing Amplitude: 2.5 V
Lead Channel Setting Sensing Sensitivity: 2 mV
MDC IDC LEAD LOCATION: 753860
MDC IDC MSMT BATTERY IMPEDANCE: 156 Ohm
MDC IDC MSMT BATTERY REMAINING LONGEVITY: 106 mo
MDC IDC MSMT BATTERY VOLTAGE: 2.8 V
MDC IDC MSMT LEADCHNL RV IMPEDANCE VALUE: 514 Ohm
MDC IDC PG IMPLANT DT: 20150817
MDC IDC SET LEADCHNL RA PACING AMPLITUDE: 2.5 V
MDC IDC SET LEADCHNL RV PACING PULSEWIDTH: 0.4 ms
MDC IDC STAT BRADY AP VP PERCENT: 94 %

## 2017-02-11 DIAGNOSIS — I4891 Unspecified atrial fibrillation: Secondary | ICD-10-CM | POA: Diagnosis not present

## 2017-02-11 DIAGNOSIS — Z7901 Long term (current) use of anticoagulants: Secondary | ICD-10-CM | POA: Diagnosis not present

## 2017-02-11 LAB — PROTIME-INR: INR: 2 — AB (ref 0.9–1.1)

## 2017-02-12 ENCOUNTER — Ambulatory Visit (INDEPENDENT_AMBULATORY_CARE_PROVIDER_SITE_OTHER): Payer: Medicare Other

## 2017-02-12 DIAGNOSIS — Z7901 Long term (current) use of anticoagulants: Secondary | ICD-10-CM

## 2017-02-12 DIAGNOSIS — I4819 Other persistent atrial fibrillation: Secondary | ICD-10-CM

## 2017-02-12 DIAGNOSIS — I481 Persistent atrial fibrillation: Secondary | ICD-10-CM

## 2017-02-28 ENCOUNTER — Other Ambulatory Visit: Payer: Self-pay

## 2017-02-28 DIAGNOSIS — E1165 Type 2 diabetes mellitus with hyperglycemia: Principal | ICD-10-CM

## 2017-02-28 DIAGNOSIS — IMO0002 Reserved for concepts with insufficient information to code with codable children: Secondary | ICD-10-CM

## 2017-02-28 DIAGNOSIS — I10 Essential (primary) hypertension: Secondary | ICD-10-CM

## 2017-02-28 DIAGNOSIS — E785 Hyperlipidemia, unspecified: Secondary | ICD-10-CM

## 2017-02-28 DIAGNOSIS — N182 Chronic kidney disease, stage 2 (mild): Principal | ICD-10-CM

## 2017-02-28 DIAGNOSIS — N183 Chronic kidney disease, stage 3 unspecified: Secondary | ICD-10-CM

## 2017-02-28 DIAGNOSIS — E1122 Type 2 diabetes mellitus with diabetic chronic kidney disease: Secondary | ICD-10-CM

## 2017-03-04 ENCOUNTER — Other Ambulatory Visit: Payer: Medicare Other

## 2017-03-04 DIAGNOSIS — E1122 Type 2 diabetes mellitus with diabetic chronic kidney disease: Secondary | ICD-10-CM | POA: Diagnosis not present

## 2017-03-04 DIAGNOSIS — N183 Chronic kidney disease, stage 3 (moderate): Secondary | ICD-10-CM | POA: Diagnosis not present

## 2017-03-04 DIAGNOSIS — E785 Hyperlipidemia, unspecified: Secondary | ICD-10-CM | POA: Diagnosis not present

## 2017-03-04 DIAGNOSIS — I10 Essential (primary) hypertension: Secondary | ICD-10-CM | POA: Diagnosis not present

## 2017-03-04 DIAGNOSIS — N182 Chronic kidney disease, stage 2 (mild): Secondary | ICD-10-CM | POA: Diagnosis not present

## 2017-03-04 DIAGNOSIS — E1165 Type 2 diabetes mellitus with hyperglycemia: Secondary | ICD-10-CM | POA: Diagnosis not present

## 2017-03-04 LAB — COMPREHENSIVE METABOLIC PANEL
ALT: 12 U/L (ref 6–29)
AST: 19 U/L (ref 10–35)
Albumin: 3.5 g/dL — ABNORMAL LOW (ref 3.6–5.1)
Alkaline Phosphatase: 56 U/L (ref 33–130)
BUN: 45 mg/dL — AB (ref 7–25)
CALCIUM: 9.1 mg/dL (ref 8.6–10.4)
CHLORIDE: 98 mmol/L (ref 98–110)
CO2: 30 mmol/L (ref 20–31)
Creat: 1.53 mg/dL — ABNORMAL HIGH (ref 0.60–0.88)
Glucose, Bld: 162 mg/dL — ABNORMAL HIGH (ref 65–99)
POTASSIUM: 4.3 mmol/L (ref 3.5–5.3)
Sodium: 138 mmol/L (ref 135–146)
TOTAL PROTEIN: 6 g/dL — AB (ref 6.1–8.1)
Total Bilirubin: 0.8 mg/dL (ref 0.2–1.2)

## 2017-03-04 LAB — HEMOGLOBIN A1C
Hgb A1c MFr Bld: 7.2 % — ABNORMAL HIGH (ref <5.7)
MEAN PLASMA GLUCOSE: 160 mg/dL

## 2017-03-04 LAB — LIPID PANEL
CHOL/HDL RATIO: 2.6 ratio (ref <5.0)
CHOLESTEROL: 103 mg/dL (ref <200)
HDL: 40 mg/dL — AB (ref >50)
LDL Cholesterol: 40 mg/dL (ref <100)
TRIGLYCERIDES: 117 mg/dL (ref <150)
VLDL: 23 mg/dL (ref <30)

## 2017-03-08 ENCOUNTER — Other Ambulatory Visit: Payer: Self-pay | Admitting: Internal Medicine

## 2017-03-11 ENCOUNTER — Ambulatory Visit (INDEPENDENT_AMBULATORY_CARE_PROVIDER_SITE_OTHER): Payer: Self-pay | Admitting: Internal Medicine

## 2017-03-11 DIAGNOSIS — Z7901 Long term (current) use of anticoagulants: Secondary | ICD-10-CM | POA: Diagnosis not present

## 2017-03-11 DIAGNOSIS — I4819 Other persistent atrial fibrillation: Secondary | ICD-10-CM

## 2017-03-11 DIAGNOSIS — I481 Persistent atrial fibrillation: Secondary | ICD-10-CM

## 2017-03-11 DIAGNOSIS — I4891 Unspecified atrial fibrillation: Secondary | ICD-10-CM | POA: Diagnosis not present

## 2017-03-11 LAB — PROTIME-INR: INR: 2.6 — AB (ref 0.9–1.1)

## 2017-03-12 ENCOUNTER — Encounter: Payer: Self-pay | Admitting: Internal Medicine

## 2017-03-12 ENCOUNTER — Non-Acute Institutional Stay: Payer: Medicare Other | Admitting: Internal Medicine

## 2017-03-12 VITALS — BP 100/56 | HR 62 | Temp 97.3°F | Ht 63.0 in | Wt 114.0 lb

## 2017-03-12 DIAGNOSIS — I4819 Other persistent atrial fibrillation: Secondary | ICD-10-CM

## 2017-03-12 DIAGNOSIS — E1122 Type 2 diabetes mellitus with diabetic chronic kidney disease: Secondary | ICD-10-CM | POA: Diagnosis not present

## 2017-03-12 DIAGNOSIS — R609 Edema, unspecified: Secondary | ICD-10-CM

## 2017-03-12 DIAGNOSIS — I481 Persistent atrial fibrillation: Secondary | ICD-10-CM | POA: Diagnosis not present

## 2017-03-12 DIAGNOSIS — N182 Chronic kidney disease, stage 2 (mild): Secondary | ICD-10-CM

## 2017-03-12 DIAGNOSIS — I5022 Chronic systolic (congestive) heart failure: Secondary | ICD-10-CM

## 2017-03-12 DIAGNOSIS — E785 Hyperlipidemia, unspecified: Secondary | ICD-10-CM

## 2017-03-12 DIAGNOSIS — IMO0002 Reserved for concepts with insufficient information to code with codable children: Secondary | ICD-10-CM

## 2017-03-12 DIAGNOSIS — E1165 Type 2 diabetes mellitus with hyperglycemia: Secondary | ICD-10-CM

## 2017-03-12 DIAGNOSIS — T7840XA Allergy, unspecified, initial encounter: Secondary | ICD-10-CM | POA: Diagnosis not present

## 2017-03-12 NOTE — Progress Notes (Signed)
Facility  [facility]    Place of Service: Clinic (12)     Allergies  Allergen Reactions  . Penicillins Other (See Comments)    Temperature went up to 104 F  . Penicillin G Benzathine & Proc Other (See Comments)    Unknown    Chief Complaint  Patient presents with  . Medical Management of Chronic Issues    4 month medication management blood pressure, blood sugar, A-Fib, CKD, cholesterol review labs    HPI:  Persistent atrial fibrillation (HCC) - rate controlled  Chronic systolic congestive heart failure ([place]) - compensated  Uncontrolled type 2 diabetes mellitus with stage 2 chronic kidney disease, without long-term current use of insulin (HCC) - controlled  Edema, unspecified type - comes and goes  Hyperlipidemia, unspecified hyperlipidemia type - controlled  Allergic state, initial encounter - aggravated by pollen allergies    Medications: Patient's Medications  New Prescriptions   No medications on file  Previous Medications   CRESTOR 10 MG TABLET    TAKE 1 TABLET BY MOUTH EVERY DAY TO CONTROL CHOLESTEROL   FUROSEMIDE (LASIX) 40 MG TABLET    Take 1.5 tablets (60 mg total) by mouth daily.   GLIMEPIRIDE (AMARYL) 2 MG TABLET    TAKE ONE TABLET BY MOUTH ONCE DAILY WITH BREAKFAST TO CONTROL BLOOD SUGAR   METFORMIN (GLUCOPHAGE) 500 MG TABLET    TAKE 1 TABLET TWICE DAILY TO CONTROL GLUCOSE   METOPROLOL TARTRATE (LOPRESSOR) 25 MG TABLET    TAKE 1 TABLET BY MOUTH TWICE A DAY TO CONTROL BLOOD PRESSURE   MULTIPLE VITAMINS-MINERALS (MULTIVITAMIN PO)    Take 1 tablet by mouth daily.   SPIRONOLACTONE (ALDACTONE) 25 MG TABLET    TAKE 1 TABLET BY MOUTH EVERY DAY   UNABLE TO FIND    PT/INR Check on Friday 06/29/16, call or fax with results. Phone [phone], Fax [phone]   WARFARIN (COUMADIN) 5 MG TABLET    TAKE 1 TABLET BY MOUTH EVERY DAY FOR ANTICOAGULATION  Modified Medications   No medications on file  Discontinued Medications   WARFARIN (COUMADIN) 5 MG TABLET    Take one  tablet daily except  Friday  take 1/2 tablet     Review of Systems  Constitutional: Positive for activity change and fatigue. Negative for appetite change, chills, fever and unexpected weight change.  HENT: Negative for congestion, ear pain, mouth sores, sinus pressure and sore throat.        Hx of allergic symptoms manifested by rhinorrhea, teary eyes, sneezing, coughing.  Respiratory: Negative for cough, chest tightness and shortness of breath.   Cardiovascular: Negative for chest pain, palpitations and leg swelling.       History of atrial fibrillation and aortic valvular stenosis. She is on chronic anticoagulation with warfarin for these problems.  Gastrointestinal: Negative for abdominal distention, abdominal pain, blood in stool, constipation, diarrhea and nausea.  Endocrine: Negative.   Genitourinary: Negative for decreased urine volume, difficulty urinating and dysuria.  Musculoskeletal: Positive for back pain and gait problem (Last fall 03/07/2015). Negative for joint swelling, myalgias, neck pain and neck stiffness.       Onset of pain in the left leg since 03/31/16. Seems to be better now.  Allergic/Immunologic: Negative.   Neurological: Negative for dizziness, tremors, seizures, syncope, facial asymmetry, speech difficulty, weakness, light-headedness and headaches.  Hematological: Negative.   Psychiatric/Behavioral: Negative for agitation, confusion, decreased concentration, self-injury and sleep disturbance. The patient is not nervous/anxious and is not hyperactive.  Vitals:   03/12/17 0903  BP: (!) 100/56  Pulse: 62  Temp: 97.3 F (36.3 C)  TempSrc: Oral  SpO2: 99%  Weight: 114 lb (51.7 kg)  Height: _0  (1.6 m)   Wt Readings from Last 3 Encounters:  03/12/17 114 lb (51.7 kg)  11/05/16 119 lb 3.2 oz (54.1 kg)  07/17/16 124 lb (56.2 kg)    Body mass index is 20.19 kg/m.  Physical Exam  Constitutional: She is oriented to person, place, and time. She appears  well-developed. No distress.  HENT:  Head: Normocephalic and atraumatic.  Nose: Nose normal.  Xerostomia  Eyes: Conjunctivae and EOM are normal. Pupils are equal, round, and reactive to light. Left eye exhibits no discharge. No scleral icterus.  Xerophthalmia  Neck: Normal range of motion. Neck supple. No JVD present. No tracheal deviation present. No thyromegaly present.  Cardiovascular: Exam reveals no friction rub.   Murmur heard. 3/6 LSB SEM. Pacemaker in left upper chest. AF.  Pulmonary/Chest: No respiratory distress. She has no wheezes. She has no rales.  Abdominal: Bowel sounds are normal. She exhibits no distension and no mass. There is no tenderness.  Musculoskeletal: Normal range of motion. She exhibits edema (1-2+ bipedal). She exhibits no tenderness.  Edematous legs bilaterally. Tender in the left calf and medial left thigh.  Lymphadenopathy:    She has no cervical adenopathy.  Neurological: She is alert and oriented to person, place, and time. She has normal reflexes. No cranial nerve deficit. Coordination normal.  Skin: Skin is dry. No rash noted. No erythema. No pallor.  Speckled red discoloration of both lower legs  Psychiatric: She has a normal mood and affect. Her behavior is normal. Thought content normal.     Labs reviewed: Lab Summary Latest Ref Rng & Units 03/04/2017 11/05/2016 07/05/2016 06/26/2016  Hemoglobin 11.7 - 15.5 g/dL (None) (None) (None) 12.9  Hematocrit 35.0 - 45.0 % (None) (None) (None) 38.3  White count 3.8 - 10.8 K/uL (None) (None) (None) 6.4  Platelet count 140 - 400 K/uL (None) (None) (None) 192  Sodium 135 - 146 mmol/L 138 139 141 138  Potassium 3.5 - 5.3 mmol/L 4.3 4.4 4.7 4.5  Calcium 8.6 - 10.4 mg/dL 9.1 (None) (None) 9.0  Phosphorus - (None) (None) (None) (None)  Creatinine 0.60 - 0.88 mg/dL 1.53(H) 1.2(A) 1.3(A) 1.29(H)  AST 10 - 35 U/L 19 (None) (None) (None)  Alk Phos 33 - 130 U/L 56 (None) (None) (None)  Bilirubin 0.2 - 1.2 mg/dL 0.8  (None) (None) (None)  Glucose 65 - 99 mg/dL 162(H) 138 155 118(H)  Cholesterol <200 mg/dL 103 (None) (None) (None)  HDL cholesterol >50 mg/dL 40(L) (None) (None) (None)  Triglycerides <150 mg/dL 117 (None) (None) (None)  LDL Direct - (None) (None) (None) (None)  LDL Calc <100 mg/dL 40 (None) (None) (None)  Total protein 6.1 - 8.1 g/dL 6.0(L) (None) (None) (None)  Albumin 3.6 - 5.1 g/dL 3.5(L) (None) (None) (None)  Some recent data might be hidden   Lab Results  Component Value Date   TSH 3.23 11/02/2013   Lab Results  Component Value Date   BUN 45 (H) 03/04/2017   BUN 31 (A) 11/05/2016   BUN 33 (A) 07/05/2016   Lab Results  Component Value Date   CREATININE 1.53 (H) 03/04/2017   CREATININE 1.2 (A) 11/05/2016   CREATININE 1.3 (A) 07/05/2016   Lab Results  Component Value Date   HGBA1C 7.2 (H) 03/04/2017   HGBA1C 7.0 11/05/2016   HGBA1C 8.2  07/05/2016       Assessment/Plan  1. Persistent atrial fibrillation (HCC) The current medical regimen is effective;  continue present plan and medications.  2. Chronic systolic congestive heart failure (HCC) Compensated The current medical regimen is effective;  continue present plan and medications.  3. Uncontrolled type 2 diabetes mellitus with stage 2 chronic kidney disease, without long-term current use of insulin (HCC) The current medical regimen is effective;  continue present plan and medications. - Hemoglobin A1c; Future - Microalbumin, urine; Future - Comprehensive metabolic panel; Future  4. Edema, unspecified type The current medical regimen is effective;  continue present plan and medications. - Comprehensive metabolic panel; Future  5. Hyperlipidemia, unspecified hyperlipidemia type The current medical regimen is effective;  continue present plan and medications. - Lipid panel; Future  6. Allergic state, initial encounter -try Tavist or Zyrtec

## 2017-04-04 DIAGNOSIS — H353123 Nonexudative age-related macular degeneration, left eye, advanced atrophic without subfoveal involvement: Secondary | ICD-10-CM | POA: Diagnosis not present

## 2017-04-04 DIAGNOSIS — H3589 Other specified retinal disorders: Secondary | ICD-10-CM | POA: Diagnosis not present

## 2017-04-04 DIAGNOSIS — H353211 Exudative age-related macular degeneration, right eye, with active choroidal neovascularization: Secondary | ICD-10-CM | POA: Diagnosis not present

## 2017-04-11 ENCOUNTER — Telehealth: Payer: Self-pay | Admitting: *Deleted

## 2017-04-11 NOTE — Telephone Encounter (Signed)
[facility] called to report that the pt declined having her labs drawn today. Called the pt & she stated that she is sick & needed to rest. She stated she would go on Monday to have them drawn. Called the Director of Nursing Burke Keels to inquire if the pt could draw the labs tomorrow & she stated that the lab team only comes on Mondays and Thursdays & since the pt lives in [facility] the pt cannot have a finger stick due to regulations. The pt was due on Monday 04/08/17 but [place] did not obtain therefore in Monday we faxed over another order to have it drawn today & now the pt is sick.  The pt was called back & advised that to go on Monday per the normal procedure that she follows & if she can't go she would have to come into the office & she verbalized understanding.

## 2017-04-12 ENCOUNTER — Other Ambulatory Visit: Payer: Self-pay | Admitting: Internal Medicine

## 2017-04-15 ENCOUNTER — Ambulatory Visit (INDEPENDENT_AMBULATORY_CARE_PROVIDER_SITE_OTHER): Payer: Medicare Other | Admitting: Interventional Cardiology

## 2017-04-15 DIAGNOSIS — I4819 Other persistent atrial fibrillation: Secondary | ICD-10-CM

## 2017-04-15 DIAGNOSIS — Z7901 Long term (current) use of anticoagulants: Secondary | ICD-10-CM

## 2017-04-15 DIAGNOSIS — I4891 Unspecified atrial fibrillation: Secondary | ICD-10-CM | POA: Diagnosis not present

## 2017-04-15 DIAGNOSIS — I481 Persistent atrial fibrillation: Secondary | ICD-10-CM

## 2017-04-15 LAB — PROTIME-INR: INR: 2.2 — AB (ref 0.9–1.1)

## 2017-05-06 ENCOUNTER — Encounter: Payer: Self-pay | Admitting: Internal Medicine

## 2017-05-09 ENCOUNTER — Ambulatory Visit (INDEPENDENT_AMBULATORY_CARE_PROVIDER_SITE_OTHER): Payer: Medicare Other | Admitting: *Deleted

## 2017-05-09 DIAGNOSIS — I495 Sick sinus syndrome: Secondary | ICD-10-CM | POA: Diagnosis not present

## 2017-05-10 ENCOUNTER — Telehealth: Payer: Self-pay | Admitting: *Deleted

## 2017-05-10 ENCOUNTER — Other Ambulatory Visit: Payer: Self-pay | Admitting: Internal Medicine

## 2017-05-10 LAB — CUP PACEART REMOTE DEVICE CHECK
Battery Voltage: 2.8 V
Brady Statistic AP VP Percent: 94 %
Brady Statistic AP VS Percent: 0 %
Brady Statistic AS VP Percent: 5 %
Brady Statistic AS VS Percent: 1 %
Implantable Lead Implant Date: 20050920
Implantable Lead Location: 753860
Implantable Lead Model: 5076
Implantable Pulse Generator Implant Date: 20150817
Lead Channel Impedance Value: 384 Ohm
Lead Channel Impedance Value: 502 Ohm
Lead Channel Pacing Threshold Amplitude: 1 V
Lead Channel Setting Pacing Amplitude: 2.5 V
Lead Channel Setting Pacing Pulse Width: 0.4 ms
MDC IDC LEAD IMPLANT DT: 20050920
MDC IDC LEAD LOCATION: 753859
MDC IDC MSMT BATTERY IMPEDANCE: 156 Ohm
MDC IDC MSMT BATTERY REMAINING LONGEVITY: 106 mo
MDC IDC MSMT LEADCHNL RV PACING THRESHOLD PULSEWIDTH: 0.4 ms
MDC IDC SESS DTM: 20180607095711
MDC IDC SET LEADCHNL RA PACING AMPLITUDE: 2.5 V
MDC IDC SET LEADCHNL RV SENSING SENSITIVITY: 2 mV

## 2017-05-10 NOTE — Telephone Encounter (Signed)
Spoke with Joelene Millin at [facility] and she states Mrs Mumby will be out of town on June 25th so changed date to have INR checked to June 21    and Joelene Millin states understanding

## 2017-05-10 NOTE — Progress Notes (Signed)
Remote pacemaker transmission.   

## 2017-05-15 ENCOUNTER — Encounter: Payer: Self-pay | Admitting: Cardiology

## 2017-05-16 ENCOUNTER — Encounter: Payer: Self-pay | Admitting: Internal Medicine

## 2017-05-23 ENCOUNTER — Ambulatory Visit (INDEPENDENT_AMBULATORY_CARE_PROVIDER_SITE_OTHER): Payer: Medicare Other | Admitting: Pharmacist

## 2017-05-23 DIAGNOSIS — Z7901 Long term (current) use of anticoagulants: Secondary | ICD-10-CM

## 2017-05-23 DIAGNOSIS — Z5181 Encounter for therapeutic drug level monitoring: Secondary | ICD-10-CM | POA: Diagnosis not present

## 2017-05-23 DIAGNOSIS — I481 Persistent atrial fibrillation: Secondary | ICD-10-CM

## 2017-05-23 DIAGNOSIS — I5023 Acute on chronic systolic (congestive) heart failure: Secondary | ICD-10-CM | POA: Diagnosis not present

## 2017-05-23 DIAGNOSIS — I4819 Other persistent atrial fibrillation: Secondary | ICD-10-CM

## 2017-05-23 LAB — PROTIME-INR: INR: 2.6 — AB (ref 0.9–1.1)

## 2017-05-29 NOTE — Addendum Note (Signed)
Addended by: Royann Shivers A on: 05/29/2017 12:45 PM   Modules accepted: Orders

## 2017-06-12 ENCOUNTER — Other Ambulatory Visit: Payer: Self-pay | Admitting: Internal Medicine

## 2017-06-13 DIAGNOSIS — H353211 Exudative age-related macular degeneration, right eye, with active choroidal neovascularization: Secondary | ICD-10-CM | POA: Diagnosis not present

## 2017-06-26 ENCOUNTER — Other Ambulatory Visit: Payer: Self-pay | Admitting: Internal Medicine

## 2017-06-26 DIAGNOSIS — E1122 Type 2 diabetes mellitus with diabetic chronic kidney disease: Secondary | ICD-10-CM

## 2017-06-26 DIAGNOSIS — N182 Chronic kidney disease, stage 2 (mild): Principal | ICD-10-CM

## 2017-06-26 DIAGNOSIS — IMO0002 Reserved for concepts with insufficient information to code with codable children: Secondary | ICD-10-CM

## 2017-06-26 DIAGNOSIS — E1165 Type 2 diabetes mellitus with hyperglycemia: Principal | ICD-10-CM

## 2017-07-03 ENCOUNTER — Other Ambulatory Visit: Payer: Self-pay

## 2017-07-03 DIAGNOSIS — I5022 Chronic systolic (congestive) heart failure: Secondary | ICD-10-CM

## 2017-07-03 DIAGNOSIS — E785 Hyperlipidemia, unspecified: Secondary | ICD-10-CM

## 2017-07-03 DIAGNOSIS — R609 Edema, unspecified: Secondary | ICD-10-CM

## 2017-07-04 ENCOUNTER — Ambulatory Visit (INDEPENDENT_AMBULATORY_CARE_PROVIDER_SITE_OTHER): Payer: Medicare Other | Admitting: Cardiology

## 2017-07-04 DIAGNOSIS — Z7901 Long term (current) use of anticoagulants: Secondary | ICD-10-CM

## 2017-07-04 DIAGNOSIS — I481 Persistent atrial fibrillation: Secondary | ICD-10-CM

## 2017-07-04 DIAGNOSIS — I4819 Other persistent atrial fibrillation: Secondary | ICD-10-CM

## 2017-07-04 DIAGNOSIS — Z5181 Encounter for therapeutic drug level monitoring: Secondary | ICD-10-CM | POA: Diagnosis not present

## 2017-07-04 LAB — PROTIME-INR: INR: 5.4 — AB (ref 0.9–1.1)

## 2017-07-11 ENCOUNTER — Ambulatory Visit (INDEPENDENT_AMBULATORY_CARE_PROVIDER_SITE_OTHER): Payer: Medicare Other | Admitting: Cardiovascular Disease

## 2017-07-11 DIAGNOSIS — I4891 Unspecified atrial fibrillation: Secondary | ICD-10-CM | POA: Diagnosis not present

## 2017-07-11 DIAGNOSIS — I2510 Atherosclerotic heart disease of native coronary artery without angina pectoris: Secondary | ICD-10-CM | POA: Diagnosis not present

## 2017-07-11 DIAGNOSIS — Z7901 Long term (current) use of anticoagulants: Secondary | ICD-10-CM

## 2017-07-11 DIAGNOSIS — I481 Persistent atrial fibrillation: Secondary | ICD-10-CM

## 2017-07-11 DIAGNOSIS — I4819 Other persistent atrial fibrillation: Secondary | ICD-10-CM

## 2017-07-11 LAB — PROTIME-INR: INR: 2.4 — AB (ref 0.9–1.1)

## 2017-07-15 DIAGNOSIS — I5022 Chronic systolic (congestive) heart failure: Secondary | ICD-10-CM | POA: Diagnosis not present

## 2017-07-15 DIAGNOSIS — E785 Hyperlipidemia, unspecified: Secondary | ICD-10-CM | POA: Diagnosis not present

## 2017-07-15 DIAGNOSIS — E1165 Type 2 diabetes mellitus with hyperglycemia: Secondary | ICD-10-CM | POA: Diagnosis not present

## 2017-07-15 DIAGNOSIS — E1122 Type 2 diabetes mellitus with diabetic chronic kidney disease: Secondary | ICD-10-CM | POA: Diagnosis not present

## 2017-07-15 DIAGNOSIS — N182 Chronic kidney disease, stage 2 (mild): Secondary | ICD-10-CM | POA: Diagnosis not present

## 2017-07-15 DIAGNOSIS — R609 Edema, unspecified: Secondary | ICD-10-CM | POA: Diagnosis not present

## 2017-07-15 LAB — MICROALBUMIN, URINE: MICROALB UR: 3.9 mg/dL

## 2017-07-15 LAB — COMPREHENSIVE METABOLIC PANEL
ALT: 11 U/L (ref 6–29)
AST: 17 U/L (ref 10–35)
Albumin: 2.9 g/dL — ABNORMAL LOW (ref 3.6–5.1)
Alkaline Phosphatase: 51 U/L (ref 33–130)
BUN: 34 mg/dL — AB (ref 7–25)
CHLORIDE: 102 mmol/L (ref 98–110)
CO2: 27 mmol/L (ref 20–32)
CREATININE: 1.29 mg/dL — AB (ref 0.60–0.88)
Calcium: 8.5 mg/dL — ABNORMAL LOW (ref 8.6–10.4)
GLUCOSE: 135 mg/dL — AB (ref 65–99)
POTASSIUM: 4.3 mmol/L (ref 3.5–5.3)
SODIUM: 140 mmol/L (ref 135–146)
TOTAL PROTEIN: 5 g/dL — AB (ref 6.1–8.1)
Total Bilirubin: 0.5 mg/dL (ref 0.2–1.2)

## 2017-07-15 LAB — LIPID PANEL
CHOL/HDL RATIO: 2.6 ratio (ref <5.0)
CHOLESTEROL: 111 mg/dL (ref <200)
HDL: 42 mg/dL — ABNORMAL LOW (ref >50)
LDL CALC: 46 mg/dL (ref <100)
Triglycerides: 113 mg/dL (ref <150)
VLDL: 23 mg/dL (ref <30)

## 2017-07-15 LAB — HEMOGLOBIN A1C
Hgb A1c MFr Bld: 7.3 % — ABNORMAL HIGH (ref <5.7)
MEAN PLASMA GLUCOSE: 163 mg/dL

## 2017-07-18 ENCOUNTER — Ambulatory Visit (INDEPENDENT_AMBULATORY_CARE_PROVIDER_SITE_OTHER): Payer: Medicare Other | Admitting: Cardiovascular Disease

## 2017-07-18 DIAGNOSIS — I4891 Unspecified atrial fibrillation: Secondary | ICD-10-CM | POA: Diagnosis not present

## 2017-07-18 DIAGNOSIS — I2510 Atherosclerotic heart disease of native coronary artery without angina pectoris: Secondary | ICD-10-CM | POA: Diagnosis not present

## 2017-07-18 DIAGNOSIS — Z7901 Long term (current) use of anticoagulants: Secondary | ICD-10-CM

## 2017-07-18 DIAGNOSIS — I4819 Other persistent atrial fibrillation: Secondary | ICD-10-CM

## 2017-07-18 DIAGNOSIS — I481 Persistent atrial fibrillation: Secondary | ICD-10-CM

## 2017-07-18 LAB — PROTIME-INR: INR: 4.2 — AB (ref 0.9–1.1)

## 2017-07-19 ENCOUNTER — Encounter: Payer: Self-pay | Admitting: Family

## 2017-07-19 ENCOUNTER — Other Ambulatory Visit: Payer: Self-pay | Admitting: *Deleted

## 2017-07-19 ENCOUNTER — Non-Acute Institutional Stay: Payer: Medicare Other | Admitting: Family

## 2017-07-19 ENCOUNTER — Telehealth: Payer: Self-pay | Admitting: *Deleted

## 2017-07-19 VITALS — BP 126/82 | HR 84 | Temp 97.4°F | Resp 18 | Ht 63.0 in | Wt 122.2 lb

## 2017-07-19 DIAGNOSIS — H6123 Impacted cerumen, bilateral: Secondary | ICD-10-CM

## 2017-07-19 DIAGNOSIS — R609 Edema, unspecified: Secondary | ICD-10-CM | POA: Diagnosis not present

## 2017-07-19 DIAGNOSIS — S81802A Unspecified open wound, left lower leg, initial encounter: Secondary | ICD-10-CM

## 2017-07-19 MED ORDER — FUROSEMIDE 40 MG PO TABS: 40.0000 mg | ORAL_TABLET | Freq: Two times a day (BID) | ORAL | 3 refills | Status: DC

## 2017-07-19 MED ORDER — CARBAMIDE PEROXIDE 6.5 % OT SOLN: 5.0000 [drp] | Freq: Two times a day (BID) | OTIC | 0 refills | Status: AC

## 2017-07-19 MED ORDER — FUROSEMIDE 40 MG PO TABS: 40.0000 mg | ORAL_TABLET | Freq: Two times a day (BID) | ORAL | 0 refills | Status: AC

## 2017-07-19 NOTE — Telephone Encounter (Signed)
Appointment scheduled for 2 pm today.

## 2017-07-19 NOTE — Progress Notes (Signed)
Location:  [place] of Service:  Clinic (12) Provider: Teige Rountree FNP-C  Blanchie Serve, MD  Patient Care Team: Blanchie Serve, MD as PCP - General (Internal Medicine) [place], Peter M, MD as Consulting Physician (Cardiology) [place], [place], Nelda Bucks, NP as Nurse Practitioner [facility] Medicine)  Extended Emergency Contact Information Primary Emergency Contact: Allen,Dana Address: [address]          [place], [place] [address] [place] Phone: [phone] Mobile Phone: [phone] Relation: Daughter Secondary Emergency Contact: Edson Snowball [place] Phone: [phone] Relation: None  Goals of care: Advanced Directive information Advanced Directives 07/19/2017  Does Patient Have a Medical Advance Directive? Yes  Type of Paramedic of [place];Living will  Does patient want to make changes to medical advance directive? -  Copy of [place] in Chart? Yes     Chief Complaint  Patient presents with  . Leg Swelling    feet and legs; also has blisters on legs off and on x2 months; legs get tired    HPI:  Pt is a 83 y.o. female seen today at [facility] for an acute visit for evaluation of swelling and blisters on the legs. She has a significant medical history of HTN,Afib, CHF, Type 2 DM, CKD stage 3 among other conditions. she is seen in the clinic today accompanied with Home health care giver. She complains of blisters on left leg that comes and goes for the past two months. Care giver applied kerlix dressing this morning. She states blisters have been draining. She has had similar blisters and swelling that was managed by wound care center.She denies any fever, chills, cough, shortness of breath or wheezing.    Past Medical History:  Diagnosis Date  . Atrial fibrillation ([place])    persistent afib/ atrial flutter  . Brain atrophy 2009  . Congestive heart  failure (CHF) ([place])   . Eczema   . Edema   . Hyperlipidemia   . Hypertension   . Kidney disease, chronic, stage III (GFR 30-59 ml/min)   . Macular degeneration 2012   left   . Sick sinus syndrome ([place])    post DDD pacemaker  . Skin cancer of nose 2012   with skin graft  . Type II or unspecified type diabetes mellitus with renal manifestations, not stated as uncontrolled(250.40)    type 2  . Xerophthalmia 2012  . Xerostomia 2012   Past Surgical History:  Procedure Laterality Date  . ANKLE SURGERY Right 1990   fracture as steel plate   . CARDIAC VALVE SURGERY  2005   Dr. Ricard Dillon  . CATARACT EXTRACTION W/ INTRAOCULAR LENS IMPLANT Left 11/11/14   [place]  2008  . EYE SURGERY    . KNEE SURGERY Right 1990   fracture has plate and rod in femur  . PACEMAKER GENERATOR CHANGE  07/19/2014   Dr. Rayann Heman MDT  . PACEMAKER INSERTION  2005; 07-19-14   MDT ADDRL1 pacemaker generator change by Dr Rayann Heman [id number]  . PERMANENT PACEMAKER GENERATOR CHANGE N/A 07/19/2014   Procedure: PERMANENT PACEMAKER GENERATOR CHANGE;  Surgeon: Coralyn Mark, MD;  Location: [facility] CATH LAB;  Service: Cardiovascular;  Laterality: N/A;    Allergies  Allergen Reactions  . Penicillins Other (See Comments)    Temperature went up to 104 F  . Amiodarone   . Penicillin G Benzathine & Proc Other (See Comments)  Unknown  . Other Rash    MULTAG    Allergies as of 07/19/2017      Reactions   Penicillins Other (See Comments)   Temperature went up to 104 F   Amiodarone    Penicillin G Benzathine & Proc Other (See Comments)   Unknown   Other Rash   MULTAG      Medication List       Accurate as of 07/19/17  2:54 PM. Always use your most recent med list.          carbamide peroxide 6.5 % OTIC solution Commonly known as:  DEBROX Place 5 drops into both ears 2 (two) times daily.   furosemide 40 MG tablet Commonly known as:  LASIX Take 1 tablet (40 mg total) by mouth 2 (two) times daily.  Hold if SBP < 110   glimepiride 2 MG tablet Commonly known as:  AMARYL TAKE ONE TABLET BY MOUTH ONCE DAILY WITH BREAKFAST TO CONTROL BLOOD SUGAR   metFORMIN 500 MG tablet Commonly known as:  GLUCOPHAGE TAKE 1 TABLET TWICE DAILY TO CONTROL GLUCOSE   metoprolol tartrate 25 MG tablet Commonly known as:  LOPRESSOR TAKE 1 TABLET BY MOUTH TWICE A DAY TO CONTROL BLOOD PRESSURE   MULTIVITAMIN PO Take 1 tablet by mouth daily.   rosuvastatin 10 MG tablet Commonly known as:  CRESTOR TAKE 1 TABLET BY MOUTH EVERY DAY TO CONTROL CHOLESTEROL   spironolactone 25 MG tablet Commonly known as:  ALDACTONE TAKE 1 TABLET BY MOUTH EVERY DAY   UNABLE TO FIND PT/INR Check on Friday 06/29/16, call or fax with results. Phone [phone], Fax [phone]   warfarin 5 MG tablet Commonly known as:  COUMADIN TAKE 1 TABLET BY MOUTH EVERY DAY FOR ANTICOAGULATION       Review of Systems  Constitutional: Negative for activity change, appetite change, chills, fatigue and fever.  Respiratory: Negative for cough, chest tightness, shortness of breath and wheezing.   Cardiovascular: Positive for leg swelling. Negative for chest pain and palpitations.  Gastrointestinal: Negative for abdominal distention, abdominal pain, constipation, diarrhea, nausea and vomiting.  Endocrine: Negative for polydipsia, polyphagia and polyuria.  Genitourinary: Negative for difficulty urinating, dysuria, flank pain, frequency and urgency.  Skin: Negative for color change, pallor and rash.       Blisters on the legs with wounds  Neurological: Negative for dizziness, syncope, light-headedness and headaches.  Psychiatric/Behavioral: Negative for agitation and confusion.    Immunization History  Administered Date(s) Administered  . Influenza Whole 09/02/2012, 09/05/2013  . Influenza-Unspecified 09/16/2014, 09/01/2015, 09/21/2016  . Pneumococcal Polysaccharide-23 12/03/2002  . Td 12/03/2005   Pertinent  Health Maintenance Due    Topic Date Due  . OPHTHALMOLOGY EXAM  10/13/1934  . MAMMOGRAM  10/13/1942  . DEXA SCAN  10/13/1989  . PNA vac Low Risk Adult (2 of 2 - PCV13) 12/04/2003  . FOOT EXAM  11/17/2015  . INFLUENZA VACCINE  07/03/2017  . HEMOGLOBIN A1C  01/15/2018  . URINE MICROALBUMIN  07/15/2018   Fall Risk  03/12/2017 04/03/2016 03/20/2016 11/16/2014 07/07/2013  Falls in the past year? No No No No No    Vitals:   07/19/17 1408  BP: 126/82  Pulse: 84  Resp: 18  Temp: (!) 97.4 F (36.3 C)  TempSrc: Oral  SpO2: 94%  Weight: 122 lb 3.2 oz (55.4 kg)  Height: 5\' 3"  (1.6 m)   Body mass index is 21.65 kg/m. Physical Exam  Constitutional: She is oriented to person, place, and time. She  appears well-developed and well-nourished. No distress.  HENT:  Head: Normocephalic.  Mouth/Throat: Oropharynx is clear and moist. No oropharyngeal exudate.  Unable to visualize bilateral TM due to cerumen impaction   Eyes: Pupils are equal, round, and reactive to light. Conjunctivae and EOM are normal. Right eye exhibits no discharge. Left eye exhibits no discharge. No scleral icterus.  Neck: Normal range of motion. No JVD present. No thyromegaly present.  Cardiovascular: Normal rate, regular rhythm, normal heart sounds and intact distal pulses.  Exam reveals no gallop and no friction rub.   No murmur heard. Pulmonary/Chest: Effort normal and breath sounds normal. No respiratory distress. She has no wheezes. She has no rales. She exhibits no tenderness.  Abdominal: Soft. Bowel sounds are normal. She exhibits no distension. There is no tenderness. There is no rebound and no guarding.  Musculoskeletal: Normal range of motion. She exhibits no tenderness or deformity.  Bilateral lower extremities 2-3 + edema.   Lymphadenopathy:    She has no cervical adenopathy.  Neurological: She is oriented to person, place, and time. Coordination normal.  Skin: Skin is warm and dry. No rash noted. No erythema. No pallor.  Left leg shin and  posterior calf muscle open wound areas wound bed red with serous drainage.Multiple small bulla noted on leg leg and right leg.surrounding skin tissue without any signs of infections.    Psychiatric: She has a normal mood and affect.   Labs reviewed:  Recent Labs  11/05/16 03/04/17 0750 07/15/17 0750  NA 139 138 140  K 4.4 4.3 4.3  CL  --  98 102  CO2  --  30 27  GLUCOSE  --  162* 135*  BUN 31* 45* 34*  CREATININE 1.2* 1.53* 1.29*  CALCIUM  --  9.1 8.5*    Recent Labs  03/04/17 0750 07/15/17 0750  AST 19 17  ALT 12 11  ALKPHOS 56 51  BILITOT 0.8 0.5  PROT 6.0* 5.0*  ALBUMIN 3.5* 2.9*   Lab Results  Component Value Date   TSH 3.23 11/02/2013   Lab Results  Component Value Date   HGBA1C 7.3 (H) 07/15/2017   Lab Results  Component Value Date   CHOL 111 07/15/2017   HDL 42 (L) 07/15/2017   LDLCALC 46 07/15/2017   TRIG 113 07/15/2017   CHOLHDL 2.6 07/15/2017   Significant Diagnostic Results in last 30 days:  No results found.  Assessment/Plan 1. Edema Bilateral lower extremities worsening edema 2-3+ with serous weeping drainage noted.Has had 8.2 lbs weight gain over 4 months.No cough, shortness of breath, wheezing or rales during visit. Currently on Furosemide40 mg Tablet one and a half daily. Increase Furosemide 40 mg Tablet to twice daily. CMP 07/22/2017.      2. Multiple open wounds of left lower extremity, initial encounter Afebrile.Left leg shin and posterior calf muscle open wound areas wound bed red with serous drainage.Multiple small bulla noted on leg leg and right leg.surrounding skin tissue without any signs of infections. Home health Nurse to cleanse left leg wound with saline, pat dry, cover with xeroform 4 X 4 gauze, dry gauze, ABD pad and wrap with Kerlix from toes to below the knee.Change dressing every three days.discussed with patient to report any signs of redness,yellow drainage or odor from open wounds.continue to monitor. CBC/diff 07/22/2017.        3. Bilateral impacted cerumen TM not visualized. Start debrox 6.5 % instil 5 gtts twice daily x 4 days then follow 07/24/2017 for lavage with warm  water.   Family/ staff Communication: Reviewed plan of care with patient and care giver. Labs/tests ordered: CBC/diff, CMP 07/22/2017  Follow up: Has upcoming appointment with Dr. Bubba Camp on 07/24/2017  Sandrea Hughs, NP

## 2017-07-19 NOTE — Telephone Encounter (Signed)
Patient called and left message on Clinical intake and  stated that she has an appointment with Dr. Bubba Camp on Tuesday 07/24/17 at [facility] but she she stated that her legs are broke out with blisters and they are leaking and wonders if she should be seen today. Please Advise.   I have tried to call patient back 4 times and she picks up and hangs up and then it rings busy.

## 2017-07-19 NOTE — Telephone Encounter (Signed)
I can see the patient today in clinic at 2 pm. Please call patient and schedule appointment for today.

## 2017-07-22 ENCOUNTER — Encounter: Payer: Medicare Other | Admitting: Internal Medicine

## 2017-07-23 ENCOUNTER — Encounter: Payer: Self-pay | Admitting: Internal Medicine

## 2017-07-24 ENCOUNTER — Non-Acute Institutional Stay: Payer: Medicare Other | Admitting: Internal Medicine

## 2017-07-24 ENCOUNTER — Encounter: Payer: Self-pay | Admitting: Internal Medicine

## 2017-07-24 VITALS — BP 122/64 | HR 81 | Temp 97.6°F | Resp 18 | Ht 63.0 in | Wt 121.6 lb

## 2017-07-24 DIAGNOSIS — I481 Persistent atrial fibrillation: Secondary | ICD-10-CM | POA: Diagnosis not present

## 2017-07-24 DIAGNOSIS — I509 Heart failure, unspecified: Secondary | ICD-10-CM | POA: Diagnosis not present

## 2017-07-24 DIAGNOSIS — L97921 Non-pressure chronic ulcer of unspecified part of left lower leg limited to breakdown of skin: Secondary | ICD-10-CM

## 2017-07-24 DIAGNOSIS — E785 Hyperlipidemia, unspecified: Secondary | ICD-10-CM | POA: Diagnosis not present

## 2017-07-24 DIAGNOSIS — N183 Chronic kidney disease, stage 3 unspecified: Secondary | ICD-10-CM

## 2017-07-24 DIAGNOSIS — H6123 Impacted cerumen, bilateral: Secondary | ICD-10-CM | POA: Diagnosis not present

## 2017-07-24 DIAGNOSIS — I4819 Other persistent atrial fibrillation: Secondary | ICD-10-CM

## 2017-07-24 DIAGNOSIS — R600 Localized edema: Secondary | ICD-10-CM

## 2017-07-24 DIAGNOSIS — E1122 Type 2 diabetes mellitus with diabetic chronic kidney disease: Secondary | ICD-10-CM

## 2017-07-24 MED ORDER — POTASSIUM CHLORIDE CRYS ER 20 MEQ PO TBCR: 20.0000 meq | EXTENDED_RELEASE_TABLET | Freq: Every day | ORAL | 3 refills | Status: AC

## 2017-07-24 NOTE — Progress Notes (Signed)
[facility]  Provider: Blanchie Serve MD   Location:  [place] of Service:  Clinic (12)  PCP: Blanchie Serve, MD Patient Care Team: Blanchie Serve, MD as PCP - General (Internal Medicine) Martinique, Peter M, MD as Consulting Physician (Cardiology) Sparkill, Sumner, Nelda Bucks, NP as Nurse Practitioner [facility] Medicine)  Extended Emergency Contact Information Primary Emergency Contact: Allen,Dana Address: [address]          [place], [place] [address] [place] Phone: [phone] Mobile Phone: [phone] Relation: Daughter Secondary Emergency Contact: Edson Snowball [place] Phone: [phone] Relation: None   Goals of Care: Advanced Directive information Advanced Directives 07/19/2017  Does Patient Have a Medical Advance Directive? Yes  Type of Paramedic of [place];Living will  Does patient want to make changes to medical advance directive? -  Copy of [place] in Chart? Yes      Chief Complaint  Patient presents with  . Medical Management of Chronic Issues    4 month follow up. Patient stated that her legs are still weeping( the left more than the right).    . Medication Refill    No refills needed at this time    HPI: Patient is a 83 y.o. female seen today for routine visit.   Leg edema- improved some with increase in dosing of furosemide. Continues to get her legs wrapped by her caregiver every other day.continues to have drainage.Occasional pain but denies claudication pain. Complaints of rest pain to left leg. Has history of venous ulcer in past.   afib- denies palpitation. She is currently on metoprolol tartrate and coumadin. Followed by cardiology.  Hyperlipidemia- currently on rosuvastatin 10 mg daily. LDL at goal.   Chronic systolic CHF- denies dyspnea or chest pain. Leg swelling has subsided some. Currently on b blocker,  spironolactone and furosemide. Keeps head end elevated at rest.   Type 2 DM with ckd- on metformin 500 mgbid and glimepiride 2 mg daily. Reviewed a1c this visit. Denies polydypsia and polyphagia.   ckd stage 3- good urine output.   Impacted cerumen- has been applying debrox ear drops. Denies tinnitus, ear ache or discharge  Past Medical History:  Diagnosis Date  . Atrial fibrillation ([place])    persistent afib/ atrial flutter  . Brain atrophy 2009  . Congestive heart failure (CHF) ([place])   . Eczema   . Edema   . Hyperlipidemia   . Hypertension   . Kidney disease, chronic, stage III (GFR 30-59 ml/min)   . Macular degeneration 2012   left   . Sick sinus syndrome ([place])    post DDD pacemaker  . Skin cancer of nose 2012   with skin graft  . Type II or unspecified type diabetes mellitus with renal manifestations, not stated as uncontrolled(250.40)    type 2  . Xerophthalmia 2012  . Xerostomia 2012   Past Surgical History:  Procedure Laterality Date  . ANKLE SURGERY Right 1990   fracture as steel plate   . CARDIAC VALVE SURGERY  2005   Dr. Ricard Dillon  . CATARACT EXTRACTION W/ INTRAOCULAR LENS IMPLANT Left 11/11/14   [place]  2008  . EYE SURGERY    . KNEE SURGERY Right 1990   fracture has plate and rod in femur  . PACEMAKER GENERATOR CHANGE  07/19/2014   Dr. Rayann Heman MDT  . PACEMAKER INSERTION  2005; 07-19-14  MDT ADDRL1 pacemaker generator change by Dr Rayann Heman [id number]  . PERMANENT PACEMAKER GENERATOR CHANGE N/A 07/19/2014   Procedure: PERMANENT PACEMAKER GENERATOR CHANGE;  Surgeon: Coralyn Mark, MD;  Location: [facility];  Service: Cardiovascular;  Laterality: N/A;    reports that she has never smoked. She has never used smokeless tobacco. She reports that she does not drink alcohol or use drugs. Social History   Social History  . Marital status: Widowed    Spouse name: Marcello Moores  . Number of children: N/A  . Years of education: N/A   Occupational History    . retired Personal assistant Retired   Social History Main Topics  . Smoking status: Never Smoker  . Smokeless tobacco: Never Used  . Alcohol use No  . Drug use: No  . Sexual activity: No   Other Topics Concern  . Not on file   Social History Narrative   M[facility] to [facility] 08/30/2012   Widowed 2013   Walks with walker   Exercise walking daily    Plays bridge 14 times a month   Never smoked   Alcohol none   POA, Living Will          Functional Status Survey:    Family History  Problem Relation Age of Onset  . Stroke Mother   . Cancer Brother   . Anuerysm Sister     Health Maintenance  Topic Date Due  . INFLUENZA VACCINE  09/02/2017 (Originally 07/03/2017)  . PNA vac Low Risk Adult (2 of 2 - PCV13) 09/02/2017 (Originally 12/04/2003)  . FOOT EXAM  12/03/2017 (Originally 11/17/2015)  . MAMMOGRAM  12/03/2017 (Originally 10/13/1940)  . OPHTHALMOLOGY EXAM  12/03/2017 (Originally 10/13/1934)  . DEXA SCAN  12/03/2017 (Originally 10/13/1989)  . TETANUS/TDAP  12/03/2017 (Originally 12/04/2015)  . HEMOGLOBIN A1C  01/15/2018  . URINE MICROALBUMIN  07/15/2018    Allergies  Allergen Reactions  . Penicillins Other (See Comments)    Temperature went up to 104 F  . Amiodarone   . Penicillin G Benzathine & Proc Other (See Comments)    Unknown  . Other Rash    MULTAG    Outpatient Encounter Prescriptions as of 07/24/2017  Medication Sig  . carbamide peroxide (DEBROX) 6.5 % OTIC solution Place 5 drops into both ears 2 (two) times daily.  . furosemide (LASIX) 40 MG tablet Take 1 tablet (40 mg total) by mouth 2 (two) times daily. Take one at 9AM and one at [facility] for CHF/Edema  . glimepiride (AMARYL) 2 MG tablet TAKE ONE TABLET BY MOUTH ONCE DAILY WITH BREAKFAST TO CONTROL BLOOD SUGAR  . metFORMIN (GLUCOPHAGE) 500 MG tablet TAKE 1 TABLET TWICE DAILY TO CONTROL GLUCOSE  . metoprolol tartrate (LOPRESSOR) 25 MG tablet TAKE 1 TABLET BY MOUTH TWICE A DAY TO CONTROL BLOOD PRESSURE  .  Multiple Vitamins-Minerals (MULTIVITAMIN PO) Take 1 tablet by mouth daily.  . rosuvastatin (CRESTOR) 10 MG tablet TAKE 1 TABLET BY MOUTH EVERY DAY TO CONTROL CHOLESTEROL  . spironolactone (ALDACTONE) 25 MG tablet TAKE 1 TABLET BY MOUTH EVERY DAY  . UNABLE TO FIND PT/INR Check on Friday 06/29/16, call or fax with results. Phone [phone], Fax [phone]  . warfarin (COUMADIN) 5 MG tablet TAKE 1 TABLET BY MOUTH EVERY DAY FOR ANTICOAGULATION   No facility-administered encounter medications on file as of 07/24/2017.     Review of Systems  Constitutional: Negative for appetite change, chills, diaphoresis and fever.  HENT: Positive for congestion, hearing loss and rhinorrhea. Negative  for ear discharge, ear pain, facial swelling, mouth sores, sinus pain, sinus pressure, sore throat, tinnitus and trouble swallowing.   Eyes: Negative for pain and itching.       Has corrective lenses, has had cataract surgery to both eyes, follows with eye doctor.   Respiratory: Negative for cough, chest tightness, shortness of breath and wheezing.   Cardiovascular: Positive for leg swelling. Negative for chest pain and palpitations.  Gastrointestinal: Negative for abdominal pain, blood in stool, constipation, diarrhea, nausea and vomiting.  Genitourinary: Positive for frequency. Negative for dysuria, flank pain, hematuria and urgency.  Musculoskeletal: Negative for back pain and gait problem.       No fall reported  Neurological: Negative for dizziness, tremors, seizures, syncope, light-headedness, numbness and headaches.  Hematological: Bruises/bleeds easily.  Psychiatric/Behavioral: Negative for behavioral problems, confusion, decreased concentration, hallucinations and sleep disturbance.    Vitals:   07/24/17 0827  BP: 122/64  Pulse: 81  Resp: 18  Temp: 97.6 F (36.4 C)  TempSrc: Oral  SpO2: 98%  Weight: 121 lb 9.6 oz (55.2 kg)  Height: '5\' 3"'  (1.6 m)   Body mass index is 21.54 kg/m.   Wt  Readings from Last 3 Encounters:  07/24/17 121 lb 9.6 oz (55.2 kg)  07/19/17 122 lb 3.2 oz (55.4 kg)  03/12/17 114 lb (51.7 kg)    Physical Exam  Constitutional: She is oriented to person, place, and time. She appears well-developed and well-nourished. No distress.  HENT:  Head: Normocephalic and atraumatic.  Mouth/Throat: Oropharynx is clear and moist. No oropharyngeal exudate.  Cerumen to both ears  Eyes: Pupils are equal, round, and reactive to light. Conjunctivae and EOM are normal. Right eye exhibits no discharge. Left eye exhibits no discharge. No scleral icterus.  Neck: Normal range of motion. No JVD present. No thyromegaly present.  Cardiovascular:  Irregular heart rate  Pulmonary/Chest: Effort normal and breath sounds normal. No respiratory distress. She has no rales.  Abdominal: Soft. Bowel sounds are normal. There is no tenderness. There is no guarding.  Musculoskeletal: She exhibits edema.  Can move all 4 extremities, stooping during walking, small steps, no assistive device, 1+ pitting edema from foot upto knee, weak dorsalis pedis pulse  Lymphadenopathy:    She has no cervical adenopathy.  Neurological: She is alert and oriented to person, place, and time.  Skin: Skin is warm and dry. She is not diaphoretic.  Erythema to both legs, open skin area with bleed to left leg, no purulent drainage, open area to right leg and an intact blister to right leg.  Psychiatric: She has a normal mood and affect. Her behavior is normal.    Labs reviewed: Basic Metabolic Panel:  Recent Labs  11/05/16 03/04/17 0750 07/15/17 0750  NA 139 138 140  K 4.4 4.3 4.3  CL  --  98 102  CO2  --  30 27  GLUCOSE  --  162* 135*  BUN 31* 45* 34*  CREATININE 1.2* 1.53* 1.29*  CALCIUM  --  9.1 8.5*   Liver Function Tests:  Recent Labs  03/04/17 0750 07/15/17 0750  AST 19 17  ALT 12 11  ALKPHOS 56 51  BILITOT 0.8 0.5  PROT 6.0* 5.0*  ALBUMIN 3.5* 2.9*   No results for input(s):  LIPASE, AMYLASE in the last 8760 hours. No results for input(s): AMMONIA in the last 8760 hours. CBC: No results for input(s): WBC, NEUTROABS, HGB, HCT, MCV, PLT in the last 8760 hours. Cardiac Enzymes: No results for  input(s): CKTOTAL, CKMB, CKMBINDEX, TROPONINI in the last 8760 hours. BNP: Invalid input(s): POCBNP Lab Results  Component Value Date   HGBA1C 7.3 (H) 07/15/2017   Lab Results  Component Value Date   TSH 3.23 11/02/2013   No results found for: VITAMINB12 No results found for: FOLATE No results found for: IRON, TIBC, FERRITIN  Lipid Panel:  Recent Labs  03/04/17 0750 07/15/17 0750  CHOL 103 111  HDL 40* 42*  LDLCALC 40 46  TRIG 117 113  CHOLHDL 2.6 2.6   Lab Results  Component Value Date   HGBA1C 7.3 (H) 07/15/2017    Procedures since last visit: No results found.  Assessment/Plan  1. Ulcer of left lower extremity, limited to breakdown of skin ([place]) Has multiple open area to left leg - calf area with some bleeding, surface otherwise clean with serous drainage. On chart review, has history of venous ulcer in past. ABI in 2014 was normal. Given her new complaint of rest pain and her distal pulses not palpable on exam, will obtain another ABI to check for circulation and if normal, will refer to wound care clinic for possible una boot vs compression multilayer wrap. Advised to keep legs elevated for now. Clean area with saline and pat dry, then apply xeroform with ABD pad and wrap with kerlix dressing from toe to knee area until seen by wound care clinic. Continue diuretics.  - VAS [place] ABI WITH/WO TBI; Future - Ambulatory referral to Wound Clinic - BMP with eGFR; Future  2. Bilateral leg edema Keep legs elevated, weight stable, continue furosemide 40 mg bid for now and add kcl supplement  3. Persistent atrial fibrillation (HCC) Controlled HR, continue warfarin for anticoagulation and metoprolol tartrate  4. Chronic congestive heart failure, unspecified  heart failure type ([place]) Denies dyspnea, leg swelling somewhat improved per pt, continue spironolactone, furosemide and metoprolol tartrate. Weight stable.   5. Kidney disease, chronic, stage III (GFR 30-59 ml/min) Avoid NSAIDs.   6. Controlled type 2 diabetes mellitus with stage 3 chronic kidney disease, without long-term current use of insulin (HCC) Lab Results  Component Value Date   HGBA1C 7.3 (H) 07/15/2017   on metformin 500 mg bid and glimepiride 2 mg daily. Continue current regimen.  Urine negative for microalbuminuria.   7. Hyperlipidemia LDL goal <70 currently on rosuvastatin 10 mg daily. LDL at goal.   8. Impacted cerumen of both ears S/p ear lavage with some cerumen removed, per pt hearing better. Monitor clinically   Labs/tests ordered:  bmp  Next appointment: 1 month  Communication: reviewed care plan with patient, answered her questions,  I spent 45 minutes in total face-to-face time with the patient, more than 50% of which was spent in counseling and coordination of care, reviewing test results, reviewing medication and discussing or reviewing the diagnosis with patient.      Blanchie Serve, MD Internal Medicine [facility] [address] [place], [place] [address] Cell Phone (Monday-Friday 8 am - 5 pm): [phone] On Call: [phone] and follow prompts after 5 pm and on weekends Office Phone: [phone] Office Fax: [phone]

## 2017-07-25 ENCOUNTER — Encounter: Payer: Medicare Other | Admitting: Internal Medicine

## 2017-07-25 ENCOUNTER — Ambulatory Visit (INDEPENDENT_AMBULATORY_CARE_PROVIDER_SITE_OTHER): Payer: Medicare Other | Admitting: Internal Medicine

## 2017-07-25 DIAGNOSIS — I4891 Unspecified atrial fibrillation: Secondary | ICD-10-CM | POA: Diagnosis not present

## 2017-07-25 DIAGNOSIS — I4819 Other persistent atrial fibrillation: Secondary | ICD-10-CM

## 2017-07-25 DIAGNOSIS — Z7901 Long term (current) use of anticoagulants: Secondary | ICD-10-CM

## 2017-07-25 DIAGNOSIS — I2510 Atherosclerotic heart disease of native coronary artery without angina pectoris: Secondary | ICD-10-CM | POA: Diagnosis not present

## 2017-07-25 DIAGNOSIS — I481 Persistent atrial fibrillation: Secondary | ICD-10-CM

## 2017-07-25 LAB — PROTIME-INR: INR: 2.4 — AB (ref 0.9–1.1)

## 2017-07-26 ENCOUNTER — Encounter: Payer: Self-pay | Admitting: Pharmacist

## 2017-07-31 ENCOUNTER — Ambulatory Visit (HOSPITAL_COMMUNITY)
Admission: RE | Admit: 2017-07-31 | Discharge: 2017-07-31 | Disposition: A | Discharge status: Home / Self Care | Payer: Medicare Other | Source: Ambulatory Visit | Attending: Internal Medicine | Admitting: Internal Medicine

## 2017-07-31 DIAGNOSIS — L97921 Non-pressure chronic ulcer of unspecified part of left lower leg limited to breakdown of skin: Secondary | ICD-10-CM | POA: Diagnosis not present

## 2017-07-31 NOTE — Progress Notes (Signed)
VASCULAR LAB PRELIMINARY  ARTERIAL  ABI completed: Right ABI of 1.05 and left ABI of 0.99 are suggestive of arterial flow within normal limits at rest.   RIGHT    LEFT    PRESSURE WAVEFORM  PRESSURE WAVEFORM  BRACHIAL 128 Biphasic BRACHIAL 113 Biphasic  DP 97 Monophasic DP 122 Monophasic  PT 135 Monophasic PT 127 Monophasic  GREAT TOE 30 NA GREAT TOE 74 NA    RIGHT LEFT  ABI 1.05 0.99     Legrand Como, RVT 07/31/2017, 3:25 PM

## 2017-08-08 ENCOUNTER — Ambulatory Visit (INDEPENDENT_AMBULATORY_CARE_PROVIDER_SITE_OTHER): Payer: Medicare Other | Admitting: *Deleted

## 2017-08-08 ENCOUNTER — Ambulatory Visit (INDEPENDENT_AMBULATORY_CARE_PROVIDER_SITE_OTHER): Payer: Medicare Other | Admitting: Cardiology

## 2017-08-08 DIAGNOSIS — I2510 Atherosclerotic heart disease of native coronary artery without angina pectoris: Secondary | ICD-10-CM | POA: Diagnosis not present

## 2017-08-08 DIAGNOSIS — Z7901 Long term (current) use of anticoagulants: Secondary | ICD-10-CM | POA: Diagnosis not present

## 2017-08-08 DIAGNOSIS — I4891 Unspecified atrial fibrillation: Secondary | ICD-10-CM | POA: Diagnosis not present

## 2017-08-08 DIAGNOSIS — I481 Persistent atrial fibrillation: Secondary | ICD-10-CM | POA: Diagnosis not present

## 2017-08-08 DIAGNOSIS — Z5181 Encounter for therapeutic drug level monitoring: Secondary | ICD-10-CM | POA: Diagnosis not present

## 2017-08-08 DIAGNOSIS — I4819 Other persistent atrial fibrillation: Secondary | ICD-10-CM

## 2017-08-08 DIAGNOSIS — I495 Sick sinus syndrome: Secondary | ICD-10-CM

## 2017-08-08 LAB — PROTIME-INR: INR: 4.4 — AB (ref 0.9–1.1)

## 2017-08-09 DIAGNOSIS — Z5181 Encounter for therapeutic drug level monitoring: Secondary | ICD-10-CM | POA: Insufficient documentation

## 2017-08-09 NOTE — Progress Notes (Signed)
Remote pacemaker transmission.   

## 2017-08-12 ENCOUNTER — Encounter (HOSPITAL_BASED_OUTPATIENT_CLINIC_OR_DEPARTMENT_OTHER): Payer: Medicare Other | Attending: Internal Medicine

## 2017-08-12 DIAGNOSIS — I87322 Chronic venous hypertension (idiopathic) with inflammation of left lower extremity: Secondary | ICD-10-CM | POA: Diagnosis not present

## 2017-08-12 DIAGNOSIS — E11622 Type 2 diabetes mellitus with other skin ulcer: Secondary | ICD-10-CM | POA: Diagnosis not present

## 2017-08-12 DIAGNOSIS — I509 Heart failure, unspecified: Secondary | ICD-10-CM | POA: Insufficient documentation

## 2017-08-12 DIAGNOSIS — L97822 Non-pressure chronic ulcer of other part of left lower leg with fat layer exposed: Secondary | ICD-10-CM | POA: Diagnosis not present

## 2017-08-12 DIAGNOSIS — L97221 Non-pressure chronic ulcer of left calf limited to breakdown of skin: Secondary | ICD-10-CM | POA: Insufficient documentation

## 2017-08-12 DIAGNOSIS — I110 Hypertensive heart disease with heart failure: Secondary | ICD-10-CM | POA: Diagnosis not present

## 2017-08-13 ENCOUNTER — Encounter: Payer: Self-pay | Admitting: Cardiology

## 2017-08-13 DIAGNOSIS — L97921 Non-pressure chronic ulcer of unspecified part of left lower leg limited to breakdown of skin: Secondary | ICD-10-CM | POA: Diagnosis not present

## 2017-08-13 DIAGNOSIS — I2510 Atherosclerotic heart disease of native coronary artery without angina pectoris: Secondary | ICD-10-CM | POA: Diagnosis not present

## 2017-08-13 DIAGNOSIS — I872 Venous insufficiency (chronic) (peripheral): Secondary | ICD-10-CM | POA: Diagnosis not present

## 2017-08-13 DIAGNOSIS — I10 Essential (primary) hypertension: Secondary | ICD-10-CM | POA: Diagnosis not present

## 2017-08-13 DIAGNOSIS — E119 Type 2 diabetes mellitus without complications: Secondary | ICD-10-CM | POA: Diagnosis not present

## 2017-08-13 DIAGNOSIS — Z7984 Long term (current) use of oral hypoglycemic drugs: Secondary | ICD-10-CM | POA: Diagnosis not present

## 2017-08-15 DIAGNOSIS — I2510 Atherosclerotic heart disease of native coronary artery without angina pectoris: Secondary | ICD-10-CM | POA: Diagnosis not present

## 2017-08-15 DIAGNOSIS — I10 Essential (primary) hypertension: Secondary | ICD-10-CM | POA: Diagnosis not present

## 2017-08-15 DIAGNOSIS — Z7984 Long term (current) use of oral hypoglycemic drugs: Secondary | ICD-10-CM | POA: Diagnosis not present

## 2017-08-15 DIAGNOSIS — I872 Venous insufficiency (chronic) (peripheral): Secondary | ICD-10-CM | POA: Diagnosis not present

## 2017-08-15 DIAGNOSIS — E119 Type 2 diabetes mellitus without complications: Secondary | ICD-10-CM | POA: Diagnosis not present

## 2017-08-15 DIAGNOSIS — L97921 Non-pressure chronic ulcer of unspecified part of left lower leg limited to breakdown of skin: Secondary | ICD-10-CM | POA: Diagnosis not present

## 2017-08-19 DIAGNOSIS — E11622 Type 2 diabetes mellitus with other skin ulcer: Secondary | ICD-10-CM | POA: Diagnosis not present

## 2017-08-19 DIAGNOSIS — I87322 Chronic venous hypertension (idiopathic) with inflammation of left lower extremity: Secondary | ICD-10-CM | POA: Diagnosis not present

## 2017-08-19 DIAGNOSIS — L97822 Non-pressure chronic ulcer of other part of left lower leg with fat layer exposed: Secondary | ICD-10-CM | POA: Diagnosis not present

## 2017-08-19 DIAGNOSIS — L97222 Non-pressure chronic ulcer of left calf with fat layer exposed: Secondary | ICD-10-CM | POA: Diagnosis not present

## 2017-08-19 DIAGNOSIS — L97221 Non-pressure chronic ulcer of left calf limited to breakdown of skin: Secondary | ICD-10-CM | POA: Diagnosis not present

## 2017-08-19 DIAGNOSIS — I110 Hypertensive heart disease with heart failure: Secondary | ICD-10-CM | POA: Diagnosis not present

## 2017-08-19 DIAGNOSIS — I509 Heart failure, unspecified: Secondary | ICD-10-CM | POA: Diagnosis not present

## 2017-08-22 ENCOUNTER — Ambulatory Visit (INDEPENDENT_AMBULATORY_CARE_PROVIDER_SITE_OTHER): Payer: Medicare Other | Admitting: Cardiovascular Disease

## 2017-08-22 DIAGNOSIS — Z7901 Long term (current) use of anticoagulants: Secondary | ICD-10-CM | POA: Diagnosis not present

## 2017-08-22 DIAGNOSIS — H353123 Nonexudative age-related macular degeneration, left eye, advanced atrophic without subfoveal involvement: Secondary | ICD-10-CM | POA: Diagnosis not present

## 2017-08-22 DIAGNOSIS — I4891 Unspecified atrial fibrillation: Secondary | ICD-10-CM

## 2017-08-22 DIAGNOSIS — R609 Edema, unspecified: Secondary | ICD-10-CM | POA: Diagnosis not present

## 2017-08-22 DIAGNOSIS — S81802A Unspecified open wound, left lower leg, initial encounter: Secondary | ICD-10-CM | POA: Diagnosis not present

## 2017-08-22 DIAGNOSIS — Z5181 Encounter for therapeutic drug level monitoring: Secondary | ICD-10-CM

## 2017-08-22 DIAGNOSIS — H35362 Drusen (degenerative) of macula, left eye: Secondary | ICD-10-CM | POA: Diagnosis not present

## 2017-08-22 DIAGNOSIS — I2510 Atherosclerotic heart disease of native coronary artery without angina pectoris: Secondary | ICD-10-CM | POA: Diagnosis not present

## 2017-08-22 DIAGNOSIS — H353211 Exudative age-related macular degeneration, right eye, with active choroidal neovascularization: Secondary | ICD-10-CM | POA: Diagnosis not present

## 2017-08-22 LAB — COMPREHENSIVE METABOLIC PANEL
AG Ratio: 1.4 (calc) (ref 1.0–2.5)
ALBUMIN MSPROF: 3.1 g/dL — AB (ref 3.6–5.1)
ALKALINE PHOSPHATASE (APISO): 48 U/L (ref 33–130)
ALT: 9 U/L (ref 6–29)
AST: 17 U/L (ref 10–35)
BILIRUBIN TOTAL: 0.4 mg/dL (ref 0.2–1.2)
BUN/Creatinine Ratio: 28 (calc) — ABNORMAL HIGH (ref 6–22)
BUN: 34 mg/dL — AB (ref 7–25)
CALCIUM: 8.5 mg/dL — AB (ref 8.6–10.4)
CHLORIDE: 102 mmol/L (ref 98–110)
CO2: 30 mmol/L (ref 20–32)
CREATININE: 1.21 mg/dL — AB (ref 0.60–0.88)
GLOBULIN: 2.2 g/dL (ref 1.9–3.7)
Glucose, Bld: 134 mg/dL — ABNORMAL HIGH (ref 65–99)
POTASSIUM: 4.1 mmol/L (ref 3.5–5.3)
Sodium: 140 mmol/L (ref 135–146)
Total Protein: 5.3 g/dL — ABNORMAL LOW (ref 6.1–8.1)

## 2017-08-22 LAB — CBC WITH DIFFERENTIAL/PLATELET
BASOS ABS: 67 {cells}/uL (ref 0–200)
BASOS PCT: 0.8 %
EOS ABS: 218 {cells}/uL (ref 15–500)
Eosinophils Relative: 2.6 %
HCT: 36.3 % (ref 35.0–45.0)
Hemoglobin: 11.5 g/dL — ABNORMAL LOW (ref 11.7–15.5)
Lymphs Abs: 2276 cells/uL (ref 850–3900)
MCH: 27 pg (ref 27.0–33.0)
MCHC: 31.7 g/dL — AB (ref 32.0–36.0)
MCV: 85.2 fL (ref 80.0–100.0)
MONOS PCT: 8.2 %
MPV: 9.5 fL (ref 7.5–12.5)
Neutro Abs: 5149 cells/uL (ref 1500–7800)
Neutrophils Relative %: 61.3 %
PLATELETS: 217 10*3/uL (ref 140–400)
RBC: 4.26 10*6/uL (ref 3.80–5.10)
RDW: 13.2 % (ref 11.0–15.0)
TOTAL LYMPHOCYTE: 27.1 %
WBC: 8.4 10*3/uL (ref 3.8–10.8)
WBCMIX: 689 {cells}/uL (ref 200–950)

## 2017-08-22 LAB — PROTIME-INR: INR: 2.4 — AB (ref 0.9–1.1)

## 2017-08-23 DIAGNOSIS — I872 Venous insufficiency (chronic) (peripheral): Secondary | ICD-10-CM | POA: Diagnosis not present

## 2017-08-23 DIAGNOSIS — I10 Essential (primary) hypertension: Secondary | ICD-10-CM | POA: Diagnosis not present

## 2017-08-23 DIAGNOSIS — Z7984 Long term (current) use of oral hypoglycemic drugs: Secondary | ICD-10-CM | POA: Diagnosis not present

## 2017-08-23 DIAGNOSIS — L97921 Non-pressure chronic ulcer of unspecified part of left lower leg limited to breakdown of skin: Secondary | ICD-10-CM | POA: Diagnosis not present

## 2017-08-23 DIAGNOSIS — I2510 Atherosclerotic heart disease of native coronary artery without angina pectoris: Secondary | ICD-10-CM | POA: Diagnosis not present

## 2017-08-23 DIAGNOSIS — E119 Type 2 diabetes mellitus without complications: Secondary | ICD-10-CM | POA: Diagnosis not present

## 2017-08-26 DIAGNOSIS — L97222 Non-pressure chronic ulcer of left calf with fat layer exposed: Secondary | ICD-10-CM | POA: Diagnosis not present

## 2017-08-26 DIAGNOSIS — I872 Venous insufficiency (chronic) (peripheral): Secondary | ICD-10-CM | POA: Diagnosis not present

## 2017-08-26 DIAGNOSIS — I509 Heart failure, unspecified: Secondary | ICD-10-CM | POA: Diagnosis not present

## 2017-08-26 DIAGNOSIS — I87322 Chronic venous hypertension (idiopathic) with inflammation of left lower extremity: Secondary | ICD-10-CM | POA: Diagnosis not present

## 2017-08-26 DIAGNOSIS — I110 Hypertensive heart disease with heart failure: Secondary | ICD-10-CM | POA: Diagnosis not present

## 2017-08-26 DIAGNOSIS — L97221 Non-pressure chronic ulcer of left calf limited to breakdown of skin: Secondary | ICD-10-CM | POA: Diagnosis not present

## 2017-08-26 DIAGNOSIS — E11622 Type 2 diabetes mellitus with other skin ulcer: Secondary | ICD-10-CM | POA: Diagnosis not present

## 2017-08-28 ENCOUNTER — Encounter: Payer: Self-pay | Admitting: Internal Medicine

## 2017-08-28 ENCOUNTER — Non-Acute Institutional Stay: Payer: Medicare Other | Admitting: Internal Medicine

## 2017-08-28 VITALS — BP 118/64 | HR 90 | Temp 97.8°F | Resp 16 | Ht 63.0 in | Wt 122.2 lb

## 2017-08-28 DIAGNOSIS — I872 Venous insufficiency (chronic) (peripheral): Secondary | ICD-10-CM | POA: Diagnosis not present

## 2017-08-28 DIAGNOSIS — I5022 Chronic systolic (congestive) heart failure: Secondary | ICD-10-CM | POA: Diagnosis not present

## 2017-08-28 DIAGNOSIS — L97921 Non-pressure chronic ulcer of unspecified part of left lower leg limited to breakdown of skin: Secondary | ICD-10-CM | POA: Diagnosis not present

## 2017-08-28 DIAGNOSIS — E119 Type 2 diabetes mellitus without complications: Secondary | ICD-10-CM | POA: Diagnosis not present

## 2017-08-28 DIAGNOSIS — R600 Localized edema: Secondary | ICD-10-CM

## 2017-08-28 DIAGNOSIS — Z7984 Long term (current) use of oral hypoglycemic drugs: Secondary | ICD-10-CM | POA: Diagnosis not present

## 2017-08-28 DIAGNOSIS — N183 Chronic kidney disease, stage 3 unspecified: Secondary | ICD-10-CM

## 2017-08-28 DIAGNOSIS — I2510 Atherosclerotic heart disease of native coronary artery without angina pectoris: Secondary | ICD-10-CM | POA: Diagnosis not present

## 2017-08-28 DIAGNOSIS — I10 Essential (primary) hypertension: Secondary | ICD-10-CM | POA: Diagnosis not present

## 2017-08-28 DIAGNOSIS — E1122 Type 2 diabetes mellitus with diabetic chronic kidney disease: Secondary | ICD-10-CM

## 2017-08-28 DIAGNOSIS — I739 Peripheral vascular disease, unspecified: Secondary | ICD-10-CM

## 2017-08-28 NOTE — Progress Notes (Signed)
[facility]  Provider: Blanchie Serve MD   Location:  [place] of Service:  Clinic (12)  PCP: Blanchie Serve, MD Patient Care Team: Blanchie Serve, MD as PCP - General (Internal Medicine) Martinique, Peter M, MD as Consulting Physician (Cardiology) Cochran, O'Neill, Nelda Bucks, NP as Nurse Practitioner [facility] Medicine)  Extended Emergency Contact Information Primary Emergency Contact: Allen,Dana Address: [address]          [place],  [address] [place] Phone: [phone] Mobile Phone: [phone] Relation: Daughter Secondary Emergency Contact: Edson Snowball [place] Phone: [phone] Relation: None   Goals of Care: Advanced Directive information Advanced Directives 07/19/2017  Does Patient Have a Medical Advance Directive? Yes  Type of Paramedic of [place];Living will  Does patient want to make changes to medical advance directive? -  Copy of [place] in Chart? Yes      Chief Complaint  Patient presents with  . Medical Management of Chronic Issues    1 month follow up. No concerns at this time. She stated that she went to wound center Monday and that everything had cleared up except for one area.   . Medication Refill    No refills needed at this time. Patient stated that she hasn't been taking her potassium tablet because it's to hard for her to swallow. But she stated that she's been eating bananas.  . Results    Discuss labs    HPI: Patient is a 83 y.o. female seen today for follow up visit   Chronic leg edema- on follow up, leg swelling has subsided significantly to left leg, She is seen in the wound clinic once a week now. She also has home health nurse comes to her apartment twice a week and changes her dressing to her leg. Denies any fever or chills. Denies leg pain at present. She has been taking furosemide bid but is not  taking her kcl supplement with difficulty swallowing. Does have aches after walking a certain distance that gets relieved by rest. Reviewed ABI  Diabetes mellitus- does not check her blood sugar at home. Currently taking metformin and glimepiride  Chronic systolic CHF- denies chest pain. Currently on b blocker, spironolactone and furosemide. Denies dyspnea    Past Medical History:  Diagnosis Date  . Atrial fibrillation ([place])    persistent afib/ atrial flutter  . Brain atrophy 2009  . Congestive heart failure (CHF) ([place])   . Eczema   . Edema   . Hyperlipidemia   . Hypertension   . Kidney disease, chronic, stage III (GFR 30-59 ml/min)   . Macular degeneration 2012   left   . Sick sinus syndrome ([place])    post DDD pacemaker  . Skin cancer of nose 2012   with skin graft  . Type II or unspecified type diabetes mellitus with renal manifestations, not stated as uncontrolled(250.40)    type 2  . Xerophthalmia 2012  . Xerostomia 2012   Past Surgical History:  Procedure Laterality Date  . ANKLE SURGERY Right 1990   fracture as steel plate   . CARDIAC VALVE SURGERY  2005   Dr. Ricard Dillon  . CATARACT EXTRACTION W/ INTRAOCULAR LENS IMPLANT Left 11/11/14   [place]  2008  . EYE SURGERY    . KNEE SURGERY Right 1990   fracture has plate and rod in femur  . PACEMAKER GENERATOR  CHANGE  07/19/2014   Dr. Rayann Heman MDT  . PACEMAKER INSERTION  2005; 07-19-14   MDT ADDRL1 pacemaker generator change by Dr Rayann Heman [id number]  . PERMANENT PACEMAKER GENERATOR CHANGE N/A 07/19/2014   Procedure: PERMANENT PACEMAKER GENERATOR CHANGE;  Surgeon: Coralyn Mark, MD;  Location: [facility] CATH LAB;  Service: Cardiovascular;  Laterality: N/A;    reports that she has never smoked. She has never used smokeless tobacco. She reports that she does not drink alcohol or use drugs. Social History   Social History  . Marital status: Widowed    Spouse name: Marcello Moores  . Number of children: N/A  . Years of  education: N/A   Occupational History  . retired Personal assistant Retired   Social History Main Topics  . Smoking status: Never Smoker  . Smokeless tobacco: Never Used  . Alcohol use No  . Drug use: No  . Sexual activity: No   Other Topics Concern  . Not on file   Social History Narrative   Moved to [facility] 08/30/2012   Widowed 2013   Walks with walker   Exercise walking daily    Plays bridge 14 times a month   Never smoked   Alcohol none   POA, Living Will          Functional Status Survey:    Family History  Problem Relation Age of Onset  . Stroke Mother   . Cancer Brother   . Anuerysm Sister     Health Maintenance  Topic Date Due  . INFLUENZA VACCINE  09/02/2017 (Originally 07/03/2017)  . PNA vac Low Risk Adult (2 of 2 - PCV13) 09/02/2017 (Originally 12/04/2003)  . FOOT EXAM  12/03/2017 (Originally 11/17/2015)  . MAMMOGRAM  12/03/2017 (Originally 10/13/1940)  . OPHTHALMOLOGY EXAM  12/03/2017 (Originally 10[birth year]13/1934)  . DEXA SCAN  12/03/2017 (Originally 10/13/1989)  . TETANUS/TDAP  12/03/2017 (Originally 12/04/2015)  . HEMOGLOBIN A1C  01/15/2018  . URINE MICROALBUMIN  07/15/2018    Allergies  Allergen Reactions  . Penicillins Other (See Comments)    Temperature went up to 104 F  . Amiodarone   . Penicillin G Benzathine & Proc Other (See Comments)    Unknown  . Other Rash    MULTAG    Outpatient Encounter Prescriptions as of 08/28/2017  Medication Sig  . carbamide peroxide (DEBROX) 6.5 % OTIC solution Place 5 drops into both ears 2 (two) times daily.  . furosemide (LASIX) 40 MG tablet Take 1 tablet (40 mg total) by mouth 2 (two) times daily. Take one at 9AM and one at [facility] for CHF/Edema  . glimepiride (AMARYL) 2 MG tablet TAKE ONE TABLET BY MOUTH ONCE DAILY WITH BREAKFAST TO CONTROL BLOOD SUGAR  . metFORMIN (GLUCOPHAGE) 500 MG tablet TAKE 1 TABLET TWICE DAILY TO CONTROL GLUCOSE  . metoprolol tartrate (LOPRESSOR) 25 MG tablet TAKE 1 TABLET BY MOUTH TWICE  A DAY TO CONTROL BLOOD PRESSURE  . Multiple Vitamins-Minerals (MULTIVITAMIN PO) Take 1 tablet by mouth daily.  . rosuvastatin (CRESTOR) 10 MG tablet TAKE 1 TABLET BY MOUTH EVERY DAY TO CONTROL CHOLESTEROL  . spironolactone (ALDACTONE) 25 MG tablet TAKE 1 TABLET BY MOUTH EVERY DAY  . UNABLE TO FIND PT/INR Check on Friday 06/29/16, call or fax with results. Phone [phone], Fax [phone]  . warfarin (COUMADIN) 2.5 MG tablet Take 2.5 mg by mouth. Only on Friday.  . warfarin (COUMADIN) 5 MG tablet Take 5 mg by mouth daily. Except for Friday.  . [DISCONTINUED] warfarin (COUMADIN) 5 MG  tablet TAKE 1 TABLET BY MOUTH EVERY DAY FOR ANTICOAGULATION  . potassium chloride SA (K-DUR,KLOR-CON) 20 MEQ tablet Take 1 tablet (20 mEq total) by mouth daily. (Patient not taking: Reported on 08/28/2017)   No facility-administered encounter medications on file as of 08/28/2017.     Review of Systems  Constitutional: Negative for appetite change and fever.  HENT: Positive for congestion, hearing loss and rhinorrhea. Negative for mouth sores, sinus pain, sinus pressure and sore throat.        Trouble swallowing large pills, no trouble with small pills and food or liquid  Eyes:       Has corrective lenses, has had cataract surgery to both eyes, follows with eye doctor.   Respiratory: Positive for cough and shortness of breath. Negative for chest tightness and wheezing.        Chronic dyspnea with her CHF  Cardiovascular: Positive for leg swelling. Negative for chest pain and palpitations.  Gastrointestinal: Negative for abdominal pain, nausea and vomiting.  Genitourinary: Positive for frequency. Negative for dysuria.  Musculoskeletal: Negative for back pain and gait problem.       No fall reported  Skin: Positive for wound.  Neurological: Negative for dizziness, weakness and numbness.  Hematological: Bruises/bleeds easily.  Psychiatric/Behavioral: Negative for confusion.    Vitals:   08/28/17 0819  BP:  118/64  Pulse: 90  Resp: 16  Temp: 97.8 F (36.6 C)  TempSrc: Oral  SpO2: 99%  Weight: 122 lb 3.2 oz (55.4 kg)  Height: _0  (1.6 m)   Body mass index is 21.65 kg/m.   Wt Readings from Last 3 Encounters:  08/28/17 122 lb 3.2 oz (55.4 kg)  07/24/17 121 lb 9.6 oz (55.2 kg)  07/19/17 122 lb 3.2 oz (55.4 kg)    Physical Exam  Constitutional: She is oriented to person, place, and time. She appears well-developed and well-nourished. No distress.  HENT:  Head: Normocephalic and atraumatic.  Mouth/Throat: Oropharynx is clear and moist.  Cerumen to both ears  Eyes: Pupils are equal, round, and reactive to light. Conjunctivae and EOM are normal.  Neck: Normal range of motion.  Cardiovascular:  Irregular heart rate  Pulmonary/Chest: Effort normal and breath sounds normal. She has no wheezes. She has no rales.  Abdominal: Soft. Bowel sounds are normal. There is no tenderness. There is no guarding.  Musculoskeletal: She exhibits edema.  1+ pitting edema upto thigh area. Can move all 4 extremities, no compression stocking to RLE, 3 layers compression wrap to left leg, dressing clean and dry  Lymphadenopathy:    She has no cervical adenopathy.  Neurological: She is alert and oriented to person, place, and time.  Skin: Skin is warm and dry.  Dressing to LLE  Psychiatric: She has a normal mood and affect. Her behavior is normal.    Labs reviewed: Basic Metabolic Panel:  Recent Labs  03/04/17 0750 07/15/17 0750 08/22/17 0740  NA 138 140 140  K 4.3 4.3 4.1  CL 98 102 102  CO2 _1 GLUCOSE 162* 135* 134*  BUN 45* 34* 34*  CREATININE 1.53* 1.29* 1.21*  CALCIUM 9.1 8.5* 8.5*   Liver Function Tests:  Recent Labs  03/04/17 0750 07/15/17 0750 08/22/17 0740  AST _2 ALT _3 ALKPHOS 56 51  --   BILITOT 0.8 0.5 0.4  PROT 6.0* 5.0* 5.3*  ALBUMIN 3.5* 2.9*  --    No results for input(s): LIPASE, AMYLASE in the last 8760  hours. No results for input(s):  AMMONIA in the last 8760 hours. CBC:  Recent Labs  08/22/17 0740  WBC 8.4  NEUTROABS 5,149  HGB 11.5*  HCT 36.3  MCV 85.2  PLT 217   Cardiac Enzymes: No results for input(s): CKTOTAL, CKMB, CKMBINDEX, TROPONINI in the last 8760 hours. BNP: Invalid input(s): POCBNP Lab Results  Component Value Date   HGBA1C 7.3 (H) 07/15/2017   Lab Results  Component Value Date   TSH 3.23 11/02/2013   No results found for: VITAMINB12 No results found for: FOLATE No results found for: IRON, TIBC, FERRITIN  Lipid Panel:  Recent Labs  03/04/17 0750 07/15/17 0750  CHOL 103 111  HDL 40* 42*  LDLCALC 40 46  TRIG 117 113  CHOLHDL 2.6 2.6   Lab Results  Component Value Date   HGBA1C 7.3 (H) 07/15/2017    Procedures since last visit:  07/31/18 ABI- right 1.05 and left 0.99   Assessment/Plan  1. Controlled type 2 diabetes mellitus with stage 2 chronic kidney disease, without long-term current use of insulin (HCC) Lab Results  Component Value Date   HGBA1C 7.3 (H) 07/15/2017   Continue glimepiride and metformin. No changes to her med this visit. Check a1c prior to next visit.   - CBC (no diff); Future - TSH; Future - CMP with eGFR; Future - Hemoglobin A1c; Future  2. Chronic systolic congestive heart failure (HCC) Continue lasix with spironolactone. Continue b blocker. Weight stable.  - CMP with eGFR; Future  3. Bilateral leg edema From PVD. Keep legs elevated at rest. Continue lasix. Lab reviewed.  - TSH; Future  4. PVD (peripheral vascular disease) ([place]) With wound to LLE improving, not visualized today with compression wrap to her leg. Continue wound care for now. Wound clinic note not available for review. Pt advised to wear thigh high compression stocking to her RLE.  - TSH; Future - CMP with eGFR; Future   Labs/tests ordered:  Bmp, a1c  Next appointment: 3-4 month    Blanchie Serve, MD Internal Medicine [facility]  Group [address] [place], [place] [address] Cell Phone (Monday-Friday 8 am - 5 pm): [phone] On Call: [phone] and follow prompts after 5 pm and on weekends Office Phone: [phone] Office Fax: [phone]

## 2017-08-30 DIAGNOSIS — I2510 Atherosclerotic heart disease of native coronary artery without angina pectoris: Secondary | ICD-10-CM | POA: Diagnosis not present

## 2017-08-30 DIAGNOSIS — L97921 Non-pressure chronic ulcer of unspecified part of left lower leg limited to breakdown of skin: Secondary | ICD-10-CM | POA: Diagnosis not present

## 2017-08-30 DIAGNOSIS — I872 Venous insufficiency (chronic) (peripheral): Secondary | ICD-10-CM | POA: Diagnosis not present

## 2017-08-30 DIAGNOSIS — I10 Essential (primary) hypertension: Secondary | ICD-10-CM | POA: Diagnosis not present

## 2017-08-30 DIAGNOSIS — Z7984 Long term (current) use of oral hypoglycemic drugs: Secondary | ICD-10-CM | POA: Diagnosis not present

## 2017-08-30 DIAGNOSIS — E119 Type 2 diabetes mellitus without complications: Secondary | ICD-10-CM | POA: Diagnosis not present

## 2017-08-30 LAB — CUP PACEART REMOTE DEVICE CHECK
Battery Voltage: 2.8 V
Date Time Interrogation Session: 20180905214327
Implantable Lead Implant Date: 20050920
Implantable Pulse Generator Implant Date: 20150817
Lead Channel Pacing Threshold Pulse Width: 0.4 ms
Lead Channel Setting Sensing Sensitivity: 2 mV
MDC IDC LEAD IMPLANT DT: 20050920
MDC IDC LEAD LOCATION: 753859
MDC IDC LEAD LOCATION: 753860
MDC IDC MSMT BATTERY IMPEDANCE: 156 Ohm
MDC IDC MSMT BATTERY REMAINING LONGEVITY: 115 mo
MDC IDC MSMT LEADCHNL RA IMPEDANCE VALUE: 347 Ohm
MDC IDC MSMT LEADCHNL RV IMPEDANCE VALUE: 473 Ohm
MDC IDC MSMT LEADCHNL RV PACING THRESHOLD AMPLITUDE: 0.875 V
MDC IDC SET LEADCHNL RA PACING AMPLITUDE: 2.5 V
MDC IDC SET LEADCHNL RV PACING AMPLITUDE: 2.5 V
MDC IDC SET LEADCHNL RV PACING PULSEWIDTH: 0.4 ms
MDC IDC STAT BRADY AP VP PERCENT: 88 %
MDC IDC STAT BRADY AP VS PERCENT: 0 %
MDC IDC STAT BRADY AS VP PERCENT: 5 %
MDC IDC STAT BRADY AS VS PERCENT: 6 %

## 2017-09-02 ENCOUNTER — Encounter (HOSPITAL_BASED_OUTPATIENT_CLINIC_OR_DEPARTMENT_OTHER): Payer: Medicare Other | Attending: Internal Medicine

## 2017-09-02 DIAGNOSIS — E11622 Type 2 diabetes mellitus with other skin ulcer: Secondary | ICD-10-CM | POA: Insufficient documentation

## 2017-09-02 DIAGNOSIS — L97829 Non-pressure chronic ulcer of other part of left lower leg with unspecified severity: Secondary | ICD-10-CM | POA: Insufficient documentation

## 2017-09-02 DIAGNOSIS — L97222 Non-pressure chronic ulcer of left calf with fat layer exposed: Secondary | ICD-10-CM | POA: Diagnosis not present

## 2017-09-02 DIAGNOSIS — L97212 Non-pressure chronic ulcer of right calf with fat layer exposed: Secondary | ICD-10-CM | POA: Diagnosis not present

## 2017-09-02 DIAGNOSIS — I110 Hypertensive heart disease with heart failure: Secondary | ICD-10-CM | POA: Diagnosis not present

## 2017-09-02 DIAGNOSIS — I87332 Chronic venous hypertension (idiopathic) with ulcer and inflammation of left lower extremity: Secondary | ICD-10-CM | POA: Insufficient documentation

## 2017-09-02 DIAGNOSIS — L97822 Non-pressure chronic ulcer of other part of left lower leg with fat layer exposed: Secondary | ICD-10-CM | POA: Insufficient documentation

## 2017-09-02 DIAGNOSIS — L97229 Non-pressure chronic ulcer of left calf with unspecified severity: Secondary | ICD-10-CM | POA: Diagnosis not present

## 2017-09-02 DIAGNOSIS — I509 Heart failure, unspecified: Secondary | ICD-10-CM | POA: Insufficient documentation

## 2017-09-05 ENCOUNTER — Ambulatory Visit (INDEPENDENT_AMBULATORY_CARE_PROVIDER_SITE_OTHER): Payer: Medicare Other | Admitting: Internal Medicine

## 2017-09-05 DIAGNOSIS — Z7901 Long term (current) use of anticoagulants: Secondary | ICD-10-CM | POA: Diagnosis not present

## 2017-09-05 DIAGNOSIS — Z5181 Encounter for therapeutic drug level monitoring: Secondary | ICD-10-CM | POA: Diagnosis not present

## 2017-09-05 DIAGNOSIS — I4891 Unspecified atrial fibrillation: Secondary | ICD-10-CM

## 2017-09-05 LAB — PROTIME-INR: INR: 3 — AB (ref 0.9–1.1)

## 2017-09-07 DIAGNOSIS — I872 Venous insufficiency (chronic) (peripheral): Secondary | ICD-10-CM | POA: Diagnosis not present

## 2017-09-07 DIAGNOSIS — E119 Type 2 diabetes mellitus without complications: Secondary | ICD-10-CM | POA: Diagnosis not present

## 2017-09-07 DIAGNOSIS — Z7984 Long term (current) use of oral hypoglycemic drugs: Secondary | ICD-10-CM | POA: Diagnosis not present

## 2017-09-07 DIAGNOSIS — I10 Essential (primary) hypertension: Secondary | ICD-10-CM | POA: Diagnosis not present

## 2017-09-07 DIAGNOSIS — L97921 Non-pressure chronic ulcer of unspecified part of left lower leg limited to breakdown of skin: Secondary | ICD-10-CM | POA: Diagnosis not present

## 2017-09-07 DIAGNOSIS — I2510 Atherosclerotic heart disease of native coronary artery without angina pectoris: Secondary | ICD-10-CM | POA: Diagnosis not present

## 2017-09-09 DIAGNOSIS — L97229 Non-pressure chronic ulcer of left calf with unspecified severity: Secondary | ICD-10-CM | POA: Diagnosis not present

## 2017-09-09 DIAGNOSIS — I87332 Chronic venous hypertension (idiopathic) with ulcer and inflammation of left lower extremity: Secondary | ICD-10-CM | POA: Diagnosis not present

## 2017-09-09 DIAGNOSIS — S81801A Unspecified open wound, right lower leg, initial encounter: Secondary | ICD-10-CM | POA: Diagnosis not present

## 2017-09-09 DIAGNOSIS — L97222 Non-pressure chronic ulcer of left calf with fat layer exposed: Secondary | ICD-10-CM | POA: Diagnosis not present

## 2017-09-09 DIAGNOSIS — L97212 Non-pressure chronic ulcer of right calf with fat layer exposed: Secondary | ICD-10-CM | POA: Diagnosis not present

## 2017-09-09 DIAGNOSIS — E11622 Type 2 diabetes mellitus with other skin ulcer: Secondary | ICD-10-CM | POA: Diagnosis not present

## 2017-09-09 DIAGNOSIS — L97822 Non-pressure chronic ulcer of other part of left lower leg with fat layer exposed: Secondary | ICD-10-CM | POA: Diagnosis not present

## 2017-09-09 DIAGNOSIS — L97829 Non-pressure chronic ulcer of other part of left lower leg with unspecified severity: Secondary | ICD-10-CM | POA: Diagnosis not present

## 2017-09-11 DIAGNOSIS — Z7984 Long term (current) use of oral hypoglycemic drugs: Secondary | ICD-10-CM | POA: Diagnosis not present

## 2017-09-11 DIAGNOSIS — I10 Essential (primary) hypertension: Secondary | ICD-10-CM | POA: Diagnosis not present

## 2017-09-11 DIAGNOSIS — I2510 Atherosclerotic heart disease of native coronary artery without angina pectoris: Secondary | ICD-10-CM | POA: Diagnosis not present

## 2017-09-11 DIAGNOSIS — L97921 Non-pressure chronic ulcer of unspecified part of left lower leg limited to breakdown of skin: Secondary | ICD-10-CM | POA: Diagnosis not present

## 2017-09-11 DIAGNOSIS — E119 Type 2 diabetes mellitus without complications: Secondary | ICD-10-CM | POA: Diagnosis not present

## 2017-09-11 DIAGNOSIS — I872 Venous insufficiency (chronic) (peripheral): Secondary | ICD-10-CM | POA: Diagnosis not present

## 2017-09-11 DIAGNOSIS — Z23 Encounter for immunization: Secondary | ICD-10-CM | POA: Diagnosis not present

## 2017-09-16 DIAGNOSIS — I87332 Chronic venous hypertension (idiopathic) with ulcer and inflammation of left lower extremity: Secondary | ICD-10-CM | POA: Diagnosis not present

## 2017-09-16 DIAGNOSIS — E11622 Type 2 diabetes mellitus with other skin ulcer: Secondary | ICD-10-CM | POA: Diagnosis not present

## 2017-09-16 DIAGNOSIS — L97229 Non-pressure chronic ulcer of left calf with unspecified severity: Secondary | ICD-10-CM | POA: Diagnosis not present

## 2017-09-16 DIAGNOSIS — S81801A Unspecified open wound, right lower leg, initial encounter: Secondary | ICD-10-CM | POA: Diagnosis not present

## 2017-09-16 DIAGNOSIS — L97822 Non-pressure chronic ulcer of other part of left lower leg with fat layer exposed: Secondary | ICD-10-CM | POA: Diagnosis not present

## 2017-09-16 DIAGNOSIS — L97222 Non-pressure chronic ulcer of left calf with fat layer exposed: Secondary | ICD-10-CM | POA: Diagnosis not present

## 2017-09-16 DIAGNOSIS — L97829 Non-pressure chronic ulcer of other part of left lower leg with unspecified severity: Secondary | ICD-10-CM | POA: Diagnosis not present

## 2017-09-16 DIAGNOSIS — I872 Venous insufficiency (chronic) (peripheral): Secondary | ICD-10-CM | POA: Diagnosis not present

## 2017-09-16 DIAGNOSIS — L97212 Non-pressure chronic ulcer of right calf with fat layer exposed: Secondary | ICD-10-CM | POA: Diagnosis not present

## 2017-09-18 DIAGNOSIS — I2510 Atherosclerotic heart disease of native coronary artery without angina pectoris: Secondary | ICD-10-CM | POA: Diagnosis not present

## 2017-09-18 DIAGNOSIS — E119 Type 2 diabetes mellitus without complications: Secondary | ICD-10-CM | POA: Diagnosis not present

## 2017-09-18 DIAGNOSIS — I872 Venous insufficiency (chronic) (peripheral): Secondary | ICD-10-CM | POA: Diagnosis not present

## 2017-09-18 DIAGNOSIS — L97921 Non-pressure chronic ulcer of unspecified part of left lower leg limited to breakdown of skin: Secondary | ICD-10-CM | POA: Diagnosis not present

## 2017-09-18 DIAGNOSIS — Z7984 Long term (current) use of oral hypoglycemic drugs: Secondary | ICD-10-CM | POA: Diagnosis not present

## 2017-09-18 DIAGNOSIS — I10 Essential (primary) hypertension: Secondary | ICD-10-CM | POA: Diagnosis not present

## 2017-09-20 DIAGNOSIS — I10 Essential (primary) hypertension: Secondary | ICD-10-CM | POA: Diagnosis not present

## 2017-09-20 DIAGNOSIS — L97921 Non-pressure chronic ulcer of unspecified part of left lower leg limited to breakdown of skin: Secondary | ICD-10-CM | POA: Diagnosis not present

## 2017-09-20 DIAGNOSIS — I872 Venous insufficiency (chronic) (peripheral): Secondary | ICD-10-CM | POA: Diagnosis not present

## 2017-09-20 DIAGNOSIS — Z7984 Long term (current) use of oral hypoglycemic drugs: Secondary | ICD-10-CM | POA: Diagnosis not present

## 2017-09-20 DIAGNOSIS — E119 Type 2 diabetes mellitus without complications: Secondary | ICD-10-CM | POA: Diagnosis not present

## 2017-09-20 DIAGNOSIS — I2510 Atherosclerotic heart disease of native coronary artery without angina pectoris: Secondary | ICD-10-CM | POA: Diagnosis not present

## 2017-09-23 DIAGNOSIS — L97222 Non-pressure chronic ulcer of left calf with fat layer exposed: Secondary | ICD-10-CM | POA: Diagnosis not present

## 2017-09-23 DIAGNOSIS — L97822 Non-pressure chronic ulcer of other part of left lower leg with fat layer exposed: Secondary | ICD-10-CM | POA: Diagnosis not present

## 2017-09-23 DIAGNOSIS — L97229 Non-pressure chronic ulcer of left calf with unspecified severity: Secondary | ICD-10-CM | POA: Diagnosis not present

## 2017-09-23 DIAGNOSIS — S81801A Unspecified open wound, right lower leg, initial encounter: Secondary | ICD-10-CM | POA: Diagnosis not present

## 2017-09-23 DIAGNOSIS — I872 Venous insufficiency (chronic) (peripheral): Secondary | ICD-10-CM | POA: Diagnosis not present

## 2017-09-23 DIAGNOSIS — I890 Lymphedema, not elsewhere classified: Secondary | ICD-10-CM | POA: Diagnosis not present

## 2017-09-23 DIAGNOSIS — L97212 Non-pressure chronic ulcer of right calf with fat layer exposed: Secondary | ICD-10-CM | POA: Diagnosis not present

## 2017-09-23 DIAGNOSIS — L97829 Non-pressure chronic ulcer of other part of left lower leg with unspecified severity: Secondary | ICD-10-CM | POA: Diagnosis not present

## 2017-09-23 DIAGNOSIS — I87332 Chronic venous hypertension (idiopathic) with ulcer and inflammation of left lower extremity: Secondary | ICD-10-CM | POA: Diagnosis not present

## 2017-09-23 DIAGNOSIS — E11622 Type 2 diabetes mellitus with other skin ulcer: Secondary | ICD-10-CM | POA: Diagnosis not present

## 2017-09-25 ENCOUNTER — Other Ambulatory Visit: Payer: Self-pay | Admitting: Internal Medicine

## 2017-09-25 DIAGNOSIS — L97921 Non-pressure chronic ulcer of unspecified part of left lower leg limited to breakdown of skin: Secondary | ICD-10-CM | POA: Diagnosis not present

## 2017-09-25 DIAGNOSIS — I10 Essential (primary) hypertension: Secondary | ICD-10-CM | POA: Diagnosis not present

## 2017-09-25 DIAGNOSIS — E119 Type 2 diabetes mellitus without complications: Secondary | ICD-10-CM | POA: Diagnosis not present

## 2017-09-25 DIAGNOSIS — I872 Venous insufficiency (chronic) (peripheral): Secondary | ICD-10-CM | POA: Diagnosis not present

## 2017-09-25 DIAGNOSIS — I2510 Atherosclerotic heart disease of native coronary artery without angina pectoris: Secondary | ICD-10-CM | POA: Diagnosis not present

## 2017-09-25 DIAGNOSIS — Z7984 Long term (current) use of oral hypoglycemic drugs: Secondary | ICD-10-CM | POA: Diagnosis not present

## 2017-09-26 ENCOUNTER — Ambulatory Visit (INDEPENDENT_AMBULATORY_CARE_PROVIDER_SITE_OTHER): Payer: Medicare Other | Admitting: Cardiovascular Disease

## 2017-09-26 DIAGNOSIS — I4891 Unspecified atrial fibrillation: Secondary | ICD-10-CM

## 2017-09-26 DIAGNOSIS — Z7901 Long term (current) use of anticoagulants: Secondary | ICD-10-CM

## 2017-09-26 DIAGNOSIS — Z9889 Other specified postprocedural states: Secondary | ICD-10-CM

## 2017-09-26 DIAGNOSIS — Z8679 Personal history of other diseases of the circulatory system: Secondary | ICD-10-CM | POA: Diagnosis not present

## 2017-09-26 DIAGNOSIS — Z5181 Encounter for therapeutic drug level monitoring: Secondary | ICD-10-CM

## 2017-09-26 LAB — PROTIME-INR: INR: 5.6 — AB (ref 0.9–1.1)

## 2017-09-27 DIAGNOSIS — L97921 Non-pressure chronic ulcer of unspecified part of left lower leg limited to breakdown of skin: Secondary | ICD-10-CM | POA: Diagnosis not present

## 2017-09-27 DIAGNOSIS — E119 Type 2 diabetes mellitus without complications: Secondary | ICD-10-CM | POA: Diagnosis not present

## 2017-09-27 DIAGNOSIS — I872 Venous insufficiency (chronic) (peripheral): Secondary | ICD-10-CM | POA: Diagnosis not present

## 2017-09-27 DIAGNOSIS — Z7984 Long term (current) use of oral hypoglycemic drugs: Secondary | ICD-10-CM | POA: Diagnosis not present

## 2017-09-27 DIAGNOSIS — I2510 Atherosclerotic heart disease of native coronary artery without angina pectoris: Secondary | ICD-10-CM | POA: Diagnosis not present

## 2017-09-27 DIAGNOSIS — I10 Essential (primary) hypertension: Secondary | ICD-10-CM | POA: Diagnosis not present

## 2017-09-30 DIAGNOSIS — L97229 Non-pressure chronic ulcer of left calf with unspecified severity: Secondary | ICD-10-CM | POA: Diagnosis not present

## 2017-09-30 DIAGNOSIS — L97212 Non-pressure chronic ulcer of right calf with fat layer exposed: Secondary | ICD-10-CM | POA: Diagnosis not present

## 2017-09-30 DIAGNOSIS — I890 Lymphedema, not elsewhere classified: Secondary | ICD-10-CM | POA: Diagnosis not present

## 2017-09-30 DIAGNOSIS — S81801A Unspecified open wound, right lower leg, initial encounter: Secondary | ICD-10-CM | POA: Diagnosis not present

## 2017-09-30 DIAGNOSIS — L97222 Non-pressure chronic ulcer of left calf with fat layer exposed: Secondary | ICD-10-CM | POA: Diagnosis not present

## 2017-09-30 DIAGNOSIS — I87332 Chronic venous hypertension (idiopathic) with ulcer and inflammation of left lower extremity: Secondary | ICD-10-CM | POA: Diagnosis not present

## 2017-09-30 DIAGNOSIS — E11622 Type 2 diabetes mellitus with other skin ulcer: Secondary | ICD-10-CM | POA: Diagnosis not present

## 2017-09-30 DIAGNOSIS — L97822 Non-pressure chronic ulcer of other part of left lower leg with fat layer exposed: Secondary | ICD-10-CM | POA: Diagnosis not present

## 2017-09-30 DIAGNOSIS — L97829 Non-pressure chronic ulcer of other part of left lower leg with unspecified severity: Secondary | ICD-10-CM | POA: Diagnosis not present

## 2017-09-30 DIAGNOSIS — I872 Venous insufficiency (chronic) (peripheral): Secondary | ICD-10-CM | POA: Diagnosis not present

## 2017-10-02 DIAGNOSIS — E119 Type 2 diabetes mellitus without complications: Secondary | ICD-10-CM | POA: Diagnosis not present

## 2017-10-02 DIAGNOSIS — I10 Essential (primary) hypertension: Secondary | ICD-10-CM | POA: Diagnosis not present

## 2017-10-02 DIAGNOSIS — I872 Venous insufficiency (chronic) (peripheral): Secondary | ICD-10-CM | POA: Diagnosis not present

## 2017-10-02 DIAGNOSIS — I2510 Atherosclerotic heart disease of native coronary artery without angina pectoris: Secondary | ICD-10-CM | POA: Diagnosis not present

## 2017-10-02 DIAGNOSIS — Z7984 Long term (current) use of oral hypoglycemic drugs: Secondary | ICD-10-CM | POA: Diagnosis not present

## 2017-10-02 DIAGNOSIS — L97921 Non-pressure chronic ulcer of unspecified part of left lower leg limited to breakdown of skin: Secondary | ICD-10-CM | POA: Diagnosis not present

## 2017-10-03 ENCOUNTER — Ambulatory Visit (INDEPENDENT_AMBULATORY_CARE_PROVIDER_SITE_OTHER): Payer: Medicare Other | Admitting: Cardiology

## 2017-10-03 DIAGNOSIS — I4891 Unspecified atrial fibrillation: Secondary | ICD-10-CM

## 2017-10-03 DIAGNOSIS — Z5181 Encounter for therapeutic drug level monitoring: Secondary | ICD-10-CM

## 2017-10-03 DIAGNOSIS — Z7901 Long term (current) use of anticoagulants: Secondary | ICD-10-CM | POA: Diagnosis not present

## 2017-10-03 LAB — PROTIME-INR: INR: 2.3 — AB (ref 0.9–1.1)

## 2017-10-04 DIAGNOSIS — Z7984 Long term (current) use of oral hypoglycemic drugs: Secondary | ICD-10-CM | POA: Diagnosis not present

## 2017-10-04 DIAGNOSIS — E119 Type 2 diabetes mellitus without complications: Secondary | ICD-10-CM | POA: Diagnosis not present

## 2017-10-04 DIAGNOSIS — L97921 Non-pressure chronic ulcer of unspecified part of left lower leg limited to breakdown of skin: Secondary | ICD-10-CM | POA: Diagnosis not present

## 2017-10-04 DIAGNOSIS — I872 Venous insufficiency (chronic) (peripheral): Secondary | ICD-10-CM | POA: Diagnosis not present

## 2017-10-04 DIAGNOSIS — I2510 Atherosclerotic heart disease of native coronary artery without angina pectoris: Secondary | ICD-10-CM | POA: Diagnosis not present

## 2017-10-04 DIAGNOSIS — I10 Essential (primary) hypertension: Secondary | ICD-10-CM | POA: Diagnosis not present

## 2017-10-06 ENCOUNTER — Other Ambulatory Visit: Payer: Self-pay | Admitting: Internal Medicine

## 2017-10-07 ENCOUNTER — Encounter (HOSPITAL_BASED_OUTPATIENT_CLINIC_OR_DEPARTMENT_OTHER): Payer: Medicare Other | Attending: Internal Medicine

## 2017-10-07 DIAGNOSIS — I110 Hypertensive heart disease with heart failure: Secondary | ICD-10-CM | POA: Insufficient documentation

## 2017-10-07 DIAGNOSIS — E11622 Type 2 diabetes mellitus with other skin ulcer: Secondary | ICD-10-CM | POA: Diagnosis not present

## 2017-10-07 DIAGNOSIS — E119 Type 2 diabetes mellitus without complications: Secondary | ICD-10-CM | POA: Diagnosis not present

## 2017-10-07 DIAGNOSIS — I509 Heart failure, unspecified: Secondary | ICD-10-CM | POA: Insufficient documentation

## 2017-10-07 DIAGNOSIS — L97822 Non-pressure chronic ulcer of other part of left lower leg with fat layer exposed: Secondary | ICD-10-CM | POA: Insufficient documentation

## 2017-10-07 DIAGNOSIS — L97222 Non-pressure chronic ulcer of left calf with fat layer exposed: Secondary | ICD-10-CM | POA: Diagnosis not present

## 2017-10-07 DIAGNOSIS — I87332 Chronic venous hypertension (idiopathic) with ulcer and inflammation of left lower extremity: Secondary | ICD-10-CM | POA: Insufficient documentation

## 2017-10-07 DIAGNOSIS — S81801A Unspecified open wound, right lower leg, initial encounter: Secondary | ICD-10-CM | POA: Diagnosis not present

## 2017-10-07 DIAGNOSIS — L97829 Non-pressure chronic ulcer of other part of left lower leg with unspecified severity: Secondary | ICD-10-CM | POA: Insufficient documentation

## 2017-10-07 DIAGNOSIS — I87312 Chronic venous hypertension (idiopathic) with ulcer of left lower extremity: Secondary | ICD-10-CM | POA: Diagnosis not present

## 2017-10-07 DIAGNOSIS — L97322 Non-pressure chronic ulcer of left ankle with fat layer exposed: Secondary | ICD-10-CM | POA: Diagnosis not present

## 2017-10-09 DIAGNOSIS — I872 Venous insufficiency (chronic) (peripheral): Secondary | ICD-10-CM | POA: Diagnosis not present

## 2017-10-09 DIAGNOSIS — E119 Type 2 diabetes mellitus without complications: Secondary | ICD-10-CM | POA: Diagnosis not present

## 2017-10-09 DIAGNOSIS — I2510 Atherosclerotic heart disease of native coronary artery without angina pectoris: Secondary | ICD-10-CM | POA: Diagnosis not present

## 2017-10-09 DIAGNOSIS — Z7984 Long term (current) use of oral hypoglycemic drugs: Secondary | ICD-10-CM | POA: Diagnosis not present

## 2017-10-09 DIAGNOSIS — L97921 Non-pressure chronic ulcer of unspecified part of left lower leg limited to breakdown of skin: Secondary | ICD-10-CM | POA: Diagnosis not present

## 2017-10-09 DIAGNOSIS — I10 Essential (primary) hypertension: Secondary | ICD-10-CM | POA: Diagnosis not present

## 2017-10-11 DIAGNOSIS — E119 Type 2 diabetes mellitus without complications: Secondary | ICD-10-CM | POA: Diagnosis not present

## 2017-10-11 DIAGNOSIS — L97921 Non-pressure chronic ulcer of unspecified part of left lower leg limited to breakdown of skin: Secondary | ICD-10-CM | POA: Diagnosis not present

## 2017-10-11 DIAGNOSIS — I2510 Atherosclerotic heart disease of native coronary artery without angina pectoris: Secondary | ICD-10-CM | POA: Diagnosis not present

## 2017-10-11 DIAGNOSIS — Z7984 Long term (current) use of oral hypoglycemic drugs: Secondary | ICD-10-CM | POA: Diagnosis not present

## 2017-10-11 DIAGNOSIS — I872 Venous insufficiency (chronic) (peripheral): Secondary | ICD-10-CM | POA: Diagnosis not present

## 2017-10-11 DIAGNOSIS — I10 Essential (primary) hypertension: Secondary | ICD-10-CM | POA: Diagnosis not present

## 2017-10-13 ENCOUNTER — Emergency Department (HOSPITAL_COMMUNITY)
Admission: EM | Admit: 2017-10-13 | Discharge: 2017-10-13 | Disposition: A | Discharge status: Home / Self Care | Payer: Medicare Other | Attending: Emergency Medicine | Admitting: Emergency Medicine

## 2017-10-13 ENCOUNTER — Emergency Department (HOSPITAL_COMMUNITY): Payer: Medicare Other

## 2017-10-13 ENCOUNTER — Encounter (HOSPITAL_COMMUNITY): Payer: Self-pay | Admitting: Emergency Medicine

## 2017-10-13 DIAGNOSIS — N183 Chronic kidney disease, stage 3 (moderate): Secondary | ICD-10-CM | POA: Diagnosis not present

## 2017-10-13 DIAGNOSIS — W19XXXA Unspecified fall, initial encounter: Secondary | ICD-10-CM

## 2017-10-13 DIAGNOSIS — W01198A Fall on same level from slipping, tripping and stumbling with subsequent striking against other object, initial encounter: Secondary | ICD-10-CM | POA: Diagnosis not present

## 2017-10-13 DIAGNOSIS — S098XXA Other specified injuries of head, initial encounter: Secondary | ICD-10-CM | POA: Diagnosis not present

## 2017-10-13 DIAGNOSIS — Y92007 Garden or yard of unspecified non-institutional (private) residence as the place of occurrence of the external cause: Secondary | ICD-10-CM | POA: Diagnosis not present

## 2017-10-13 DIAGNOSIS — S60512A Abrasion of left hand, initial encounter: Secondary | ICD-10-CM | POA: Diagnosis not present

## 2017-10-13 DIAGNOSIS — Y999 Unspecified external cause status: Secondary | ICD-10-CM | POA: Insufficient documentation

## 2017-10-13 DIAGNOSIS — S60511A Abrasion of right hand, initial encounter: Secondary | ICD-10-CM | POA: Diagnosis not present

## 2017-10-13 DIAGNOSIS — Z7984 Long term (current) use of oral hypoglycemic drugs: Secondary | ICD-10-CM | POA: Diagnosis not present

## 2017-10-13 DIAGNOSIS — I509 Heart failure, unspecified: Secondary | ICD-10-CM | POA: Diagnosis not present

## 2017-10-13 DIAGNOSIS — S0993XA Unspecified injury of face, initial encounter: Secondary | ICD-10-CM | POA: Diagnosis not present

## 2017-10-13 DIAGNOSIS — S61412A Laceration without foreign body of left hand, initial encounter: Secondary | ICD-10-CM | POA: Diagnosis not present

## 2017-10-13 DIAGNOSIS — I130 Hypertensive heart and chronic kidney disease with heart failure and stage 1 through stage 4 chronic kidney disease, or unspecified chronic kidney disease: Secondary | ICD-10-CM | POA: Diagnosis not present

## 2017-10-13 DIAGNOSIS — S61411A Laceration without foreign body of right hand, initial encounter: Secondary | ICD-10-CM | POA: Diagnosis not present

## 2017-10-13 DIAGNOSIS — E1122 Type 2 diabetes mellitus with diabetic chronic kidney disease: Secondary | ICD-10-CM | POA: Insufficient documentation

## 2017-10-13 DIAGNOSIS — Z7901 Long term (current) use of anticoagulants: Secondary | ICD-10-CM | POA: Insufficient documentation

## 2017-10-13 DIAGNOSIS — S0990XA Unspecified injury of head, initial encounter: Secondary | ICD-10-CM | POA: Diagnosis present

## 2017-10-13 DIAGNOSIS — R600 Localized edema: Secondary | ICD-10-CM | POA: Insufficient documentation

## 2017-10-13 DIAGNOSIS — R9431 Abnormal electrocardiogram [ECG] [EKG]: Secondary | ICD-10-CM | POA: Diagnosis not present

## 2017-10-13 DIAGNOSIS — Z23 Encounter for immunization: Secondary | ICD-10-CM | POA: Insufficient documentation

## 2017-10-13 DIAGNOSIS — Y9301 Activity, walking, marching and hiking: Secondary | ICD-10-CM | POA: Insufficient documentation

## 2017-10-13 DIAGNOSIS — S0003XA Contusion of scalp, initial encounter: Secondary | ICD-10-CM | POA: Diagnosis not present

## 2017-10-13 DIAGNOSIS — S199XXA Unspecified injury of neck, initial encounter: Secondary | ICD-10-CM | POA: Diagnosis not present

## 2017-10-13 DIAGNOSIS — S0181XA Laceration without foreign body of other part of head, initial encounter: Secondary | ICD-10-CM | POA: Diagnosis not present

## 2017-10-13 DIAGNOSIS — S0101XA Laceration without foreign body of scalp, initial encounter: Secondary | ICD-10-CM | POA: Diagnosis not present

## 2017-10-13 DIAGNOSIS — S6991XA Unspecified injury of right wrist, hand and finger(s), initial encounter: Secondary | ICD-10-CM | POA: Diagnosis not present

## 2017-10-13 DIAGNOSIS — S022XXA Fracture of nasal bones, initial encounter for closed fracture: Secondary | ICD-10-CM | POA: Diagnosis not present

## 2017-10-13 LAB — PROTIME-INR
INR: 4.09 — AB
PROTHROMBIN TIME: 39.4 s — AB (ref 11.4–15.2)

## 2017-10-13 LAB — CBG MONITORING, ED: GLUCOSE-CAPILLARY: 193 mg/dL — AB (ref 65–99)

## 2017-10-13 MED ORDER — TETANUS-DIPHTH-ACELL PERTUSSIS 5-2.5-18.5 LF-MCG/0.5 IM SUSP
0.5000 mL | Freq: Once | INTRAMUSCULAR | Status: AC
  Administered 2017-10-13: 0.5 mL via INTRAMUSCULAR
  Filled 2017-10-13: qty 0.5

## 2017-10-13 MED ORDER — ACETAMINOPHEN 325 MG PO TABS
650.0000 mg | ORAL_TABLET | Freq: Once | ORAL | Status: AC
  Administered 2017-10-13: 650 mg via ORAL
  Filled 2017-10-13: qty 2

## 2017-10-13 MED ORDER — LIDOCAINE-EPINEPHRINE (PF) 2 %-1:200000 IJ SOLN
10.0000 mL | Freq: Once | INTRAMUSCULAR | Status: AC
  Administered 2017-10-13: 10 mL
  Filled 2017-10-13: qty 20

## 2017-10-13 NOTE — Discharge Instructions (Addendum)
Please read and follow all provided instructions.  Your diagnoses today include:  1. Fall, initial encounter   2. Facial laceration, initial encounter   3. Skin tear of right hand without complication, initial encounter   4. Skin tear of left hand without complication, initial encounter   5. Periorbital edema of left eye     Tests performed today include: Vital signs. See below for your results today.   Medications prescribed:   Take any prescribed medications only as directed. Please do not take your Coumadin for 2 doses as your INR level is 4  Home care instructions:  Follow any educational materials and wound care instructions contained in this packet.   You may shower and wash the area with soap and water, just be sure to pat the area dry and not rub over the stitches. Do no put your stiches underwater (in a bath, pool, or lake). Getting stiches wet can slow down healing and increase your chances of getting an infection. You may apply Bacitracin or Neosporin twice a day for 7 days, and keep the ara clean with  bandage or gauze. Do not apply alcohol or hydrogen peroxide. Cover the area if it draining or weeping.   Follow-up instructions: Suture Removal: Return to the Emergency Department or see your primary care care doctor in 8-10 days for a recheck of your wound and removal of your sutures or staples.    Return instructions:  Return to the Emergency Department if you have: Fever Worsening pain Worsening swelling of the wound Pus draining from the wound Redness of the skin that moves away from the wound, especially if it streaks away from the affected area  Any other emergent concerns  Your vital signs today were: Temp (!) 97.5 F (36.4 C) (Oral)    Ht 5\' 2"  (1.575 m)    Wt 52.2 kg (115 lb)    SpO2 99%    BMI 21.03 kg/m  If your blood pressure (BP) was elevated above 135/85 this visit, please have this repeated by your doctor within one month. --------------

## 2017-10-13 NOTE — ED Triage Notes (Signed)
Arrived by EMS from [facility] patient ambulating tripped fell onto flower pot with sunglasses on. Left eye swollen shut laceration left eyebrow controlled by bandage prior to arrival. Patient also has skin avulsion bilateral hands. Denies LOC alert answering and following commands appropriate. Patient currently taking coumadin.

## 2017-10-13 NOTE — ED Notes (Signed)
Pt to xray at this time.

## 2017-10-13 NOTE — ED Notes (Signed)
Provider notified of patient's injury and is on coumadin.

## 2017-10-13 NOTE — ED Provider Notes (Signed)
[place] EMERGENCY DEPARTMENT Provider Note   CSN: [id number] Arrival date & time: 10/13/17  1445     History   Chief Complaint Chief Complaint  Patient presents with  . Fall  . Laceration    HPI Nancy Payne is a 83 y.o. female.  HPI  83 y.o. female with a hx of Atrial Fibrillation on Coumadin, CHF, HTN, HLD, DM2, presents to the Emergency Department today due to mechanical fall today. Pt states that she was ambulating in the garden in some gravel when she slipped. Noted falling face first with her sunglasses on and sustained laceration to left eyebrow with associated swelling around eye. Denies LOC. Denies headache or visual changes. No CP/SOB/ABD pain. No numbness/tingling. Only complaint is right hand pain on dorsal aspect. ROM intact. Sustained skin tears on bilateral hands. No meds PTA. Pt is on Coumadin for Afib. No other symptoms noted   Past Medical History:  Diagnosis Date  . Atrial fibrillation ([place])    persistent afib/ atrial flutter  . Brain atrophy 2009  . Congestive heart failure (CHF) ([place])   . Eczema   . Edema   . Hyperlipidemia   . Hypertension   . Kidney disease, chronic, stage III (GFR 30-59 ml/min) (HCC)   . Macular degeneration 2012   left   . Sick sinus syndrome ([place])    post DDD pacemaker  . Skin cancer of nose 2012   with skin graft  . Type II or unspecified type diabetes mellitus with renal manifestations, not stated as uncontrolled(250.40)    type 2  . Xerophthalmia 2012  . Xerostomia 2012    Patient Active Problem List   Diagnosis Date Noted  . PVD (peripheral vascular disease) ([place]) 08/28/2017  . Encounter for therapeutic drug monitoring 08/09/2017  . Bilateral leg edema 07/24/2017  . Pain in left leg 03/31/2016  . Allergy 05/10/2015  . Flatulence, eructation and gas pain 03/15/2015  . Ecchymosis 03/15/2015  . Fall 03/07/2015  . Atrophy of macula lutea 01/21/2015  . Age-related macular degeneration, dry  01/21/2015  . Pseudoaphakia 11/12/2014  . Artificial cardiac pacemaker 11/04/2014  . H/O aortic valve repair 11/04/2014  . Long term current use of anticoagulant therapy 08/17/2014  . Intragel vitreous hemorrhage ([place]) 04/27/2014  . Vitreous hemorrhage ([place]) 04/27/2014  . Sick sinus syndrome ([place]) 03/12/2014  . Controlled type 2 diabetes mellitus with stage 3 chronic kidney disease, without long-term current use of insulin ([place]) 01/12/2014  . DM type 2, uncontrolled, with renal complications ([place]) 78/67/6720  . Congestive heart failure (CHF) ([place])   . Hypertension   . Edema   . Hyperlipidemia LDL goal <70   . Kidney disease, chronic, stage III (GFR 30-59 ml/min) (HCC)   . Xerostomia   . Xerophthalmia   . NS (nuclear sclerosis) 09/10/2012  . AMD (age-related macular degeneration), wet ([place]) 01/04/2012  . Central retinal edema, cystoid 11/02/2011  . SRNVM (subretinal neovascularization) 11/02/2011  . OTHER ACUTE PERICARDITIS 11/01/2010  . AORTIC STENOSIS 11/01/2010  . Atrial fibrillation ([place]) 11/01/2010  . BRADYCARDIA 11/01/2010    Past Surgical History:  Procedure Laterality Date  . ANKLE SURGERY Right 1990   fracture as steel plate   . CARDIAC VALVE SURGERY  2005   Dr. Ricard Dillon  . CATARACT EXTRACTION W/ INTRAOCULAR LENS IMPLANT Left 11/11/14   [place]  2008  . EYE SURGERY    . KNEE SURGERY Right 1990   fracture has plate and rod in  f[place]  . PACEMAKER GENERATOR CHANGE  07/19/2014   Dr. Rayann Heman MDT  . PACEMAKER INSERTION  2005; 07-19-14   MDT ADDRL1 pacemaker generator change by Dr Rayann Heman [phone]    OB History    No data available       Home Medications    Prior to Admission medications   Medication Sig Start Date End Date Taking? Authorizing Provider  carbamide peroxide (DEBROX) 6.5 % OTIC solution Place 5 drops into both ears 2 (two) times daily.    [provider]  furosemide (LASIX) 40 MG tablet Take 1 tablet (40 mg total) by mouth  2 (two) times daily. Take one at 9AM and one at [facility] for CHF/Edema 07/19/17   Ngetich, Dinah C, NP  furosemide (LASIX) 40 MG tablet TAKE 1 AND 1/2 TABLET BY MOUTH EVERY DAY 09/25/17   Blanchie Serve, MD  glimepiride (AMARYL) 2 MG tablet TAKE ONE TABLET BY MOUTH ONCE DAILY WITH BREAKFAST TO CONTROL BLOOD SUGAR 05/10/17   Estill Dooms, MD  metFORMIN (GLUCOPHAGE) 500 MG tablet TAKE 1 TABLET TWICE DAILY TO CONTROL GLUCOSE 06/26/17   Blanchie Serve, MD  metoprolol tartrate (LOPRESSOR) 25 MG tablet TAKE 1 TABLET BY MOUTH TWICE A DAY TO CONTROL BLOOD PRESSURE 05/10/17   Estill Dooms, MD  Multiple Vitamins-Minerals (MULTIVITAMIN PO) Take 1 tablet by mouth daily.    [provider]  potassium chloride SA (K-DUR,KLOR-CON) 20 MEQ tablet Take 1 tablet (20 mEq total) by mouth daily. Patient not taking: Reported on 08/28/2017 07/24/17   Blanchie Serve, MD  rosuvastatin (CRESTOR) 10 MG tablet TAKE 1 TABLET BY MOUTH EVERY DAY TO CONTROL CHOLESTEROL 06/12/17   Blanchie Serve, MD  spironolactone (ALDACTONE) 25 MG tablet TAKE 1 TABLET BY MOUTH EVERY DAY 10/07/17   Blanchie Serve, MD  UNABLE TO FIND PT/INR Check on Friday 06/29/16, call or fax with results. Phone [phone], Fax [phone] 06/27/16   Gildardo Cranker, DO  warfarin (COUMADIN) 2.5 MG tablet Take 2.5 mg by mouth. Only on Friday.    [provider]  warfarin (COUMADIN) 5 MG tablet Take 5 mg by mouth daily. Except for Friday.    [provider]    Family History Family History  Problem Relation Age of Onset  . Stroke Mother   . Cancer Brother   . Anuerysm Sister     Social History Social History   Tobacco Use  . Smoking status: Never Smoker  . Smokeless tobacco: Never Used  Substance Use Topics  . Alcohol use: No  . Drug use: No     Allergies   Penicillins; Amiodarone; Penicillin g benzathine & proc; and Other   Review of Systems Review of Systems ROS reviewed and all are negative for acute change except as  noted in the HPI.  Physical Exam Updated Vital Signs Temp (!) 97.5 F (36.4 C) (Oral)   Ht 5\' 2"  (1.575 m)   Wt 52.2 kg (115 lb)   SpO2 99%   BMI 21.03 kg/m   Physical Exam  Constitutional: Vital signs are normal. She appears well-developed and well-nourished. No distress.  HENT:  Head: Normocephalic and atraumatic. Head is without raccoon's eyes and without Battle's sign.  Right Ear: No hemotympanum.  Left Ear: No hemotympanum.  Nose: Nose normal.  Mouth/Throat: Uvula is midline, oropharynx is clear and moist and mucous membranes are normal.  Periorbital swelling noted on left. No ocular involvement. Pt able to open eye lids without difficulty. EOM intact without pain. 6cm laceration above  left eyebrow noted with bottom of wound visualized. Muscle intact.   Eyes: EOM are normal. Pupils are equal, round, and reactive to light.  Neck: Trachea normal and normal range of motion. Neck supple. No spinous process tenderness and no muscular tenderness present. No tracheal deviation and normal range of motion present.  Cardiovascular: Normal rate, regular rhythm, S1 normal, S2 normal, normal heart sounds, intact distal pulses and normal pulses.  Pulmonary/Chest: Effort normal and breath sounds normal. No respiratory distress. She has no decreased breath sounds. She has no wheezes. She has no rhonchi. She has no rales.  Abdominal: Normal appearance and bowel sounds are normal. There is no tenderness. There is no rigidity and no guarding.  Musculoskeletal: Normal range of motion.  Neurological: She is alert. She has normal strength. No cranial nerve deficit or sensory deficit.  Cranial Nerves:  II: Pupils equal, round, reactive to light III,IV, VI: ptosis not present, extra-ocular motions intact bilaterally  V,VII: smile symmetric, facial light touch sensation equal VIII: hearing grossly normal bilaterally  IX,X: midline uvula rise  XI: bilateral shoulder shrug equal and strong XII: midline  tongue extension  Skin: Skin is warm and dry.  Bilateral skin tears noted on dorsum of hands. Bleeding controlled with direct pressure. Superficial skin avulsion noted below left orbit.   Psychiatric: She has a normal mood and affect. Her speech is normal and behavior is normal.  Nursing note and vitals reviewed.    ED Treatments / Results  Labs (all labs ordered are listed, but only abnormal results are displayed) Labs Reviewed  PROTIME-INR - Abnormal; Notable for the following components:      Result Value   Prothrombin Time 39.4 (*)    INR 4.09 (*)    All other components within normal limits  CBG MONITORING, ED - Abnormal; Notable for the following components:   Glucose-Capillary 193 (*)    All other components within normal limits    EKG  EKG Interpretation  Date/Time:  Sunday October 13 2017 14:56:45 EST Ventricular Rate:  109 PR Interval:    QRS Duration: 155 QT Interval:  399 QTC Calculation: 538 R Axis:   111 Text Interpretation:  Sinus tachycardia Ventricular premature complex Nonspecific intraventricular conduction delay Probable lateral infarct, age indeterminate Anterior infarct, old Baseline wander in lead(s) II aVF V3 V5 SINCE LAST TRACING HEART RATE HAS INCREASED Confirmed by Malvin Johns [phone]) on 10/13/2017 5:26:58 PM       Radiology Ct Head Wo Contrast  Result Date: 10/13/2017 CLINICAL DATA:  Facial trauma post fall. EXAM: CT HEAD WITHOUT CONTRAST CT MAXILLOFACIAL WITHOUT CONTRAST CT CERVICAL SPINE WITHOUT CONTRAST TECHNIQUE: Multidetector CT imaging of the head, cervical spine, and maxillofacial structures were performed using the standard protocol without intravenous contrast. Multiplanar CT image reconstructions of the cervical spine and maxillofacial structures were also generated. COMPARISON:  Head CT 12/04/2007 FINDINGS: CT HEAD FINDINGS Brain: No evidence of acute infarction, hemorrhage, hydrocephalus, extra-axial collection or mass lesion/mass  effect. Moderate to advanced brain parenchymal atrophy and periventricular microangiopathy. Vascular: No hyperdense vessel or unexpected calcification. Skull: Normal. Negative for fracture or focal lesion. Other: Left frontal scalp hematoma. CT MAXILLOFACIAL FINDINGS Osseous: Mildly comminuted left nasal fracture with possible extension to the anterior septum. Orbits: No evidence of orbital fractures. Sinuses: Minimal fluid level within the right maxillary sinus, the remainder of the paranasal sinuses are well aerated. Soft tissues: Left frontal and preseptal periorbital hematoma. The postseptal elements of the left orbit are intact. CT CERVICAL SPINE FINDINGS  Alignment: Normal. Skull base and vertebrae: No acute fracture. No primary bone lesion or focal pathologic process. Soft tissues and spinal canal: No prevertebral fluid or swelling. No visible canal hematoma. Disc levels:  Multilevel osteoarthritic changes, moderate to severe. Upper chest: Bilateral full effusions. Probable interstitial pulmonary edema. Calcified pleuroparenchymal scarring in the bilateral apices. Other: None. IMPRESSION: No acute intracranial abnormality. Atrophy, chronic microvascular disease. No evidence of acute traumatic injury to cervical spine. Mildly comminuted left nasal fracture with possible extension to the anterior septum. Left frontal and preseptal periorbital hematoma. The postseptal elements of the left orbit are intact. Electronically Signed   By: Fidela Salisbury M.D.   On: 10/13/2017 16:54   Ct Cervical Spine Wo Contrast  Result Date: 10/13/2017 CLINICAL DATA:  Facial trauma post fall. EXAM: CT HEAD WITHOUT CONTRAST CT MAXILLOFACIAL WITHOUT CONTRAST CT CERVICAL SPINE WITHOUT CONTRAST TECHNIQUE: Multidetector CT imaging of the head, cervical spine, and maxillofacial structures were performed using the standard protocol without intravenous contrast. Multiplanar CT image reconstructions of the cervical spine and  maxillofacial structures were also generated. COMPARISON:  Head CT 12/04/2007 FINDINGS: CT HEAD FINDINGS Brain: No evidence of acute infarction, hemorrhage, hydrocephalus, extra-axial collection or mass lesion/mass effect. Moderate to advanced brain parenchymal atrophy and periventricular microangiopathy. Vascular: No hyperdense vessel or unexpected calcification. Skull: Normal. Negative for fracture or focal lesion. Other: Left frontal scalp hematoma. CT MAXILLOFACIAL FINDINGS Osseous: Mildly comminuted left nasal fracture with possible extension to the anterior septum. Orbits: No evidence of orbital fractures. Sinuses: Minimal fluid level within the right maxillary sinus, the remainder of the paranasal sinuses are well aerated. Soft tissues: Left frontal and preseptal periorbital hematoma. The postseptal elements of the left orbit are intact. CT CERVICAL SPINE FINDINGS Alignment: Normal. Skull base and vertebrae: No acute fracture. No primary bone lesion or focal pathologic process. Soft tissues and spinal canal: No prevertebral fluid or swelling. No visible canal hematoma. Disc levels:  Multilevel osteoarthritic changes, moderate to severe. Upper chest: Bilateral full effusions. Probable interstitial pulmonary edema. Calcified pleuroparenchymal scarring in the bilateral apices. Other: None. IMPRESSION: No acute intracranial abnormality. Atrophy, chronic microvascular disease. No evidence of acute traumatic injury to cervical spine. Mildly comminuted left nasal fracture with possible extension to the anterior septum. Left frontal and preseptal periorbital hematoma. The postseptal elements of the left orbit are intact. Electronically Signed   By: Fidela Salisbury M.D.   On: 10/13/2017 16:54   Dg Hand Complete Right  Result Date: 10/13/2017 CLINICAL DATA:  Fall EXAM: RIGHT HAND - COMPLETE 3+ VIEW COMPARISON:  None. FINDINGS: No evidence of fracture of the carpal or metacarpal bones. Radiocarpal joint is  intact. Phalanges are normal. No soft tissue injury. IMPRESSION: No fracture or dislocation. Electronically Signed   By: Suzy Bouchard M.D.   On: 10/13/2017 15:48   Ct Maxillofacial Wo Contrast  Result Date: 10/13/2017 CLINICAL DATA:  Facial trauma post fall. EXAM: CT HEAD WITHOUT CONTRAST CT MAXILLOFACIAL WITHOUT CONTRAST CT CERVICAL SPINE WITHOUT CONTRAST TECHNIQUE: Multidetector CT imaging of the head, cervical spine, and maxillofacial structures were performed using the standard protocol without intravenous contrast. Multiplanar CT image reconstructions of the cervical spine and maxillofacial structures were also generated. COMPARISON:  Head CT 12/04/2007 FINDINGS: CT HEAD FINDINGS Brain: No evidence of acute infarction, hemorrhage, hydrocephalus, extra-axial collection or mass lesion/mass effect. Moderate to advanced brain parenchymal atrophy and periventricular microangiopathy. Vascular: No hyperdense vessel or unexpected calcification. Skull: Normal. Negative for fracture or focal lesion. Other: Left frontal scalp hematoma. CT  MAXILLOFACIAL FINDINGS Osseous: Mildly comminuted left nasal fracture with possible extension to the anterior septum. Orbits: No evidence of orbital fractures. Sinuses: Minimal fluid level within the right maxillary sinus, the remainder of the paranasal sinuses are well aerated. Soft tissues: Left frontal and preseptal periorbital hematoma. The postseptal elements of the left orbit are intact. CT CERVICAL SPINE FINDINGS Alignment: Normal. Skull base and vertebrae: No acute fracture. No primary bone lesion or focal pathologic process. Soft tissues and spinal canal: No prevertebral fluid or swelling. No visible canal hematoma. Disc levels:  Multilevel osteoarthritic changes, moderate to severe. Upper chest: Bilateral full effusions. Probable interstitial pulmonary edema. Calcified pleuroparenchymal scarring in the bilateral apices. Other: None. IMPRESSION: No acute intracranial  abnormality. Atrophy, chronic microvascular disease. No evidence of acute traumatic injury to cervical spine. Mildly comminuted left nasal fracture with possible extension to the anterior septum. Left frontal and preseptal periorbital hematoma. The postseptal elements of the left orbit are intact. Electronically Signed   By: Fidela Salisbury M.D.   On: 10/13/2017 16:54    Procedures .Marland KitchenLaceration Repair Date/Time: 10/13/2017 5:36 PM Performed by: Shary Decamp, PA-C Authorized by: Shary Decamp, PA-C   Consent:    Consent obtained:  Verbal   Consent given by:  Patient   Risks discussed:  Infection, pain, poor cosmetic result, poor wound healing and need for additional repair   Alternatives discussed:  Delayed treatment Anesthesia (see MAR for exact dosages):    Anesthesia method:  Local infiltration   Local anesthetic:  Lidocaine 1% WITH epi Laceration details:    Location:  Scalp   Scalp location:  Frontal   Length (cm):  6 Repair type:    Repair type:  Simple Pre-procedure details:    Preparation:  Patient was prepped and draped in usual sterile fashion and imaging obtained to evaluate for foreign bodies Exploration:    Hemostasis achieved with:  Direct pressure   Wound exploration: entire depth of wound probed and visualized   Treatment:    Area cleansed with:  Betadine and Hibiclens   Amount of cleaning:  Standard   Irrigation solution:  Sterile water Skin repair:    Repair method:  Sutures   Suture size:  4-0   Suture material:  Prolene   Suture technique:  Simple interrupted   Number of sutures:  10 Approximation:    Approximation:  Close   [place] border: well-aligned   Post-procedure details:    Dressing:  Antibiotic ointment   Patient tolerance of procedure:  Tolerated well, no immediate complications    (including critical care time)  Medications Ordered in ED Medications  lidocaine-EPINEPHrine (XYLOCAINE W/EPI) 2 %-1:200000 (PF) injection 10 mL (10 mLs  Infiltration Given 10/13/17 1621)   Initial Impression / Assessment and Plan / ED Course  I have reviewed the triage vital signs and the nursing notes.  Pertinent labs & imaging results that were available during my care of the patient were reviewed by me and considered in my medical decision making (see chart for details).  Final Clinical Impressions(s) / ED Diagnoses  {I have reviewed and evaluated the relevant laboratory values. {I have reviewed and evaluated the relevant imaging studies.  {I have reviewed the relevant previous healthcare records.  {I obtained HPI from historian. {Patient discussed with supervising physician.  ED Course:  Assessment: Pt is a 83 y.o. female with a hx of Atrial Fibrillation on Coumadin, CHF, HTN, HLD, DM2, presents to the Emergency Department today due to mechanical fall today. Pt states that  she was ambulating in the garden in some gravel when she slipped. Noted falling face first with her sunglasses on and sustained laceration to left eyebrow with associated swelling around eye. Denies LOC. Denies headache or visual changes. No CP/SOB/ABD pain. No numbness/tingling. Only complaint is right hand pain on dorsal aspect. ROM intact. Sustained skin tears on bilateral hands. No meds PTA. Pt is on Coumadin for Afib. On exam, pt in NAD. Nontoxic/nonseptic appearing. VSS. Afebrile. Lungs CTA. Heart RRR. Abdomen nontender soft. Periorbital swelling noted on left. No ocular involvement. Pt able to open eye lids without difficulty. EOM intact without pain. 6cm laceration above left eyebrow noted with bottom of wound visualized. Muscle intact. PT/INR elevated at 4. Counseled to withhold coumadin for 2 doses and close follow up with PCP. CT Head/Neck/Max unremarkable other than left nasal fracture and the hematoma. Tdap booster given. Pressure irrigation performed. Bottom of the wound visualized with bleeding controled. Laceration occurred < 8 hours prior to repair which was well  tolerated. Repaired with ELEVEN 4-0 Prolene . Pt has no co morbidities to effect normal wound healing. Discussed suture home care w pt and answered questions. Pt to f-u for wound check and suture removal in 8-10 days. . Plan is to DC home with follow up to PCP. At time of discharge, Patient is in no acute distress. Vital Signs are stable. Patient is able to ambulate. Patient able to tolerate PO.   Disposition/Plan:  DC Home Additional Verbal discharge instructions given and discussed with patient.  Pt Instructed to f/u with PCP in the next week for evaluation and treatment of symptoms. Return precautions given Pt acknowledges and agrees with plan  Supervising Physician Malvin Johns, MD  Final diagnoses:  Fall, initial encounter  Facial laceration, initial encounter  Skin tear of right hand without complication, initial encounter  Skin tear of left hand without complication, initial encounter  Periorbital edema of left eye    ED Discharge Orders    None       Shary Decamp, PA-C 10/13/17 1817    Malvin Johns, MD 10/16/17 1126

## 2017-10-14 ENCOUNTER — Telehealth: Payer: Self-pay | Admitting: *Deleted

## 2017-10-14 ENCOUNTER — Telehealth: Payer: Self-pay

## 2017-10-14 NOTE — Telephone Encounter (Signed)
I attempted to call Mrs. Nancy Payne to schedule her a hospital follow up visit with [facility] to be seen clinic tomorrow afternoon. I was unable to get in touch with the patient due to the line being busy. I will try to contact later on today.

## 2017-10-14 NOTE — Telephone Encounter (Signed)
Spoke with the [place] social worker and we schedule her appointment with [facility] for tomorrow. Raquel Sarna also mentioned that they were looking for Ms. Jastrzebski to move to snf just have some more hands on help with day to day task.

## 2017-10-14 NOTE — Telephone Encounter (Signed)
Nurse at [facility] call to inform us that pt fell yesterday outside on sidewalk and had a lot of bleeding her lacerations. She did go to ER and was instructed to hold Coumadin for 2 days per Maudie Mercury, nurse at [facility] and I verified this in ER progress note. Telephoned pt and she is aware to hold only 2 days and then resume. She has an INR check scheduled for Thursday.

## 2017-10-14 NOTE — Telephone Encounter (Signed)
So Ms. Cloe daughter in law called the office and stated that it was difficult for the patient to move around due her most recent fall. She also stated that the patient has a cut on her hand and her head. She mentioned that the patient has a wheelchair and that hopefully someone would be with her for her 3 pm appointment tomorrow. The daughter in law wanted to keep the appointment due to the [facility] needing to be filled out so that she can go to snf for rehab. Just an [place]

## 2017-10-15 ENCOUNTER — Encounter: Payer: Medicare Other | Admitting: Family

## 2017-10-15 ENCOUNTER — Other Ambulatory Visit: Payer: Self-pay | Admitting: *Deleted

## 2017-10-15 NOTE — Telephone Encounter (Signed)
Cancelled appt in clinic due to being admitted to SNF.

## 2017-10-17 DIAGNOSIS — M6281 Muscle weakness (generalized): Secondary | ICD-10-CM | POA: Diagnosis not present

## 2017-10-17 DIAGNOSIS — R2681 Unsteadiness on feet: Secondary | ICD-10-CM | POA: Diagnosis not present

## 2017-10-17 DIAGNOSIS — R1310 Dysphagia, unspecified: Secondary | ICD-10-CM | POA: Diagnosis not present

## 2017-10-18 ENCOUNTER — Non-Acute Institutional Stay (SKILLED_NURSING_FACILITY): Payer: Medicare Other | Admitting: Internal Medicine

## 2017-10-18 ENCOUNTER — Encounter: Payer: Self-pay | Admitting: Internal Medicine

## 2017-10-18 DIAGNOSIS — I10 Essential (primary) hypertension: Secondary | ICD-10-CM | POA: Diagnosis not present

## 2017-10-18 DIAGNOSIS — E1129 Type 2 diabetes mellitus with other diabetic kidney complication: Secondary | ICD-10-CM

## 2017-10-18 DIAGNOSIS — R2681 Unsteadiness on feet: Secondary | ICD-10-CM

## 2017-10-18 DIAGNOSIS — I4891 Unspecified atrial fibrillation: Secondary | ICD-10-CM

## 2017-10-18 DIAGNOSIS — R1310 Dysphagia, unspecified: Secondary | ICD-10-CM | POA: Diagnosis not present

## 2017-10-18 DIAGNOSIS — S022XXD Fracture of nasal bones, subsequent encounter for fracture with routine healing: Secondary | ICD-10-CM

## 2017-10-18 DIAGNOSIS — R791 Abnormal coagulation profile: Secondary | ICD-10-CM

## 2017-10-18 DIAGNOSIS — S0181XD Laceration without foreign body of other part of head, subsequent encounter: Secondary | ICD-10-CM

## 2017-10-18 DIAGNOSIS — N183 Chronic kidney disease, stage 3 unspecified: Secondary | ICD-10-CM

## 2017-10-18 DIAGNOSIS — E1165 Type 2 diabetes mellitus with hyperglycemia: Secondary | ICD-10-CM

## 2017-10-18 DIAGNOSIS — R531 Weakness: Secondary | ICD-10-CM

## 2017-10-18 DIAGNOSIS — S0083XS Contusion of other part of head, sequela: Secondary | ICD-10-CM | POA: Diagnosis not present

## 2017-10-18 DIAGNOSIS — M6281 Muscle weakness (generalized): Secondary | ICD-10-CM | POA: Diagnosis not present

## 2017-10-18 DIAGNOSIS — Z5181 Encounter for therapeutic drug level monitoring: Secondary | ICD-10-CM | POA: Diagnosis not present

## 2017-10-18 DIAGNOSIS — I739 Peripheral vascular disease, unspecified: Secondary | ICD-10-CM | POA: Diagnosis not present

## 2017-10-18 DIAGNOSIS — T148XXA Other injury of unspecified body region, initial encounter: Secondary | ICD-10-CM

## 2017-10-18 DIAGNOSIS — IMO0002 Reserved for concepts with insufficient information to code with codable children: Secondary | ICD-10-CM

## 2017-10-18 NOTE — Progress Notes (Signed)
Provider:  Blanchie Serve MD  Location:  [facility] Room Number: 13 Place of Service:  SNF (31)  PCP: Blanchie Serve, MD Patient Care Team: Blanchie Serve, MD as PCP - General (Internal Medicine) [place], Peter M, MD as Consulting Physician (Cardiology) [place], [place], Nelda Bucks, NP as Nurse Practitioner (Family Medicine)  Extended Emergency Contact Information Primary Emergency Contact: Allen,Dana Address: [address]          [place], [place] [address] [place] Mobile Phone: [phone] Relation: Daughter Secondary Emergency Contact: Edson Snowball [place] Phone: [phone] Relation: None  Code Status: full code  Goals of Care: Advanced Directive information Advanced Directives 10/18/2017  Does Patient Have a Medical Advance Directive? Yes  Type of Paramedic of [place];Living will  Does patient want to make changes to medical advance directive? No - Patient declined  Copy of [place] in Chart? Yes      Chief Complaint  Patient presents with  . New Admit To SNF    New Admission Visit     HPI: Patient is a 83 y.o. female seen today for admission visit. She was residing in independent living facility. She had a fall and sustained laceration to left forehead, bruise to her face, abrasion to knees and skin tear to her hands. She was seen in the ED and had sutures placed. Intracranial bleed, c-spine/ skull and orbital fractures were ruled out. She was noted to have mildly comminuted left nasal fracture.She is in SNF to undergo rehabilitation to help regain her strength and balance. She has medical history of DM, HLD, HTN, afib, PVD, CHF among others. She is seen in her room today.   Past Medical History:  Diagnosis Date  . Atrial fibrillation ([place])    persistent afib/ atrial flutter  . Brain atrophy 2009  . Congestive heart failure (CHF) ([place])   . Eczema     . Edema   . Hyperlipidemia   . Hypertension   . Kidney disease, chronic, stage III (GFR 30-59 ml/min) (H[facility])   . Macular degeneration 2012   left   . Sick sinus syndrome ([place])    post DDD pacemaker  . Skin cancer of nose 2012   with skin graft  . Type II or unspecified type diabetes mellitus with renal manifestations, not stated as uncontrolled(250.40)    type 2  . Xerophthalmia 2012  . Xerostomia 2012   Past Surgical History:  Procedure Laterality Date  . ANKLE SURGERY Right 1990   fracture as steel plate   . CARDIAC VALVE SURGERY  2005   Dr. Ricard Dillon  . CATARACT EXTRACTION W/ INTRAOCULAR LENS IMPLANT Left 11/11/14   [place]  2008  . EYE SURGERY    . KNEE SURGERY Right 1990   fracture has plate and rod in femur  . PACEMAKER GENERATOR CHANGE  07/19/2014   Dr. Rayann Heman MDT  . PACEMAKER INSERTION  2005; 07-19-14   MDT ADDRL1 pacemaker generator change by Dr Rayann Heman [id number]  . PERMANENT PACEMAKER GENERATOR CHANGE N/A 07/19/2014   Procedure: PERMANENT PACEMAKER GENERATOR CHANGE;  Surgeon: Coralyn Mark, MD;  Location: [facility] CATH LAB;  Service: Cardiovascular;  Laterality: N/A;    reports that  has never smoked. she has never used smokeless tobacco. She reports that she does not drink alcohol or use drugs. Social History   Socioeconomic History  . Marital status: Widowed    Spouse  name: Marcello Moores  . Number of children: Not on file  . Years of education: Not on file  . Highest education level: Not on file  Social Needs  . Financial resource strain: Not on file  . Food insecurity - worry: Not on file  . Food insecurity - inability: Not on file  . Transportation needs - medical: Not on file  . Transportation needs - non-medical: Not on file  Occupational History  . Occupation: retired Sports coach: RETIRED  Tobacco Use  . Smoking status: Never Smoker  . Smokeless tobacco: Never Used  Substance and Sexual Activity  . Alcohol use: No  . Drug use: No   . Sexual activity: No  Other Topics Concern  . Not on file  Social History Narrative   Moved to [facility] 08/30/2012   Widowed 2013   Walks with walker   Exercise walking daily    Plays bridge 14 times a month   Never smoked   Alcohol none   POA, Living Will       Functional Status Survey:    Family History  Problem Relation Age of Onset  . Stroke Mother   . Cancer Brother   . Anuerysm Sister     Health Maintenance  Topic Date Due  . PNA vac Low Risk Adult (2 of 2 - PCV13) 12/04/2003  . FOOT EXAM  12/03/2017 (Originally 11/17/2015)  . MAMMOGRAM  12/03/2017 (Originally [birth year]1942)  . OPHTHALMOLOGY EXAM  12/03/2017 (Originally 10/13/1934)  . DEXA SCAN  12/03/2017 (Originally 10/13/1989)  . HEMOGLOBIN A1C  01/15/2018  . URINE MICROALBUMIN  07/15/2018  . TETANUS/TDAP  10/14/2027  . INFLUENZA VACCINE  Completed    Allergies  Allergen Reactions  . Penicillins Other (See Comments)    Temperature went up to 104 F  . Amiodarone   . Penicillin G Benzathine & Proc Other (See Comments)    Unknown  . Other Rash    MULTAG    Outpatient Encounter Medications as of 10/18/2017  Medication Sig  . atorvastatin (LIPITOR) 20 MG tablet Take 20 mg daily by mouth.  . furosemide (LASIX) 40 MG tablet Take 1 tablet (40 mg total) by mouth 2 (two) times daily. Take one at 9AM and one at [facility] for CHF/Edema  . glimepiride (AMARYL) 2 MG tablet TAKE ONE TABLET BY MOUTH ONCE DAILY WITH BREAKFAST TO CONTROL BLOOD SUGAR  . metFORMIN (GLUCOPHAGE) 500 MG tablet TAKE 1 TABLET TWICE DAILY TO CONTROL GLUCOSE  . metoprolol tartrate (LOPRESSOR) 25 MG tablet TAKE 1 TABLET BY MOUTH TWICE A DAY TO CONTROL BLOOD PRESSURE  . Multiple Vitamins-Minerals (MULTIVITAMIN PO) Take 1 tablet by mouth daily.  . potassium chloride SA (K-DUR,KLOR-CON) 20 MEQ tablet Take 1 tablet (20 mEq total) by mouth daily.  Marland Kitchen spironolactone (ALDACTONE) 25 MG tablet TAKE 1 TABLET BY MOUTH EVERY DAY  . traMADol (ULTRAM) 50  MG tablet Take 25 mg every 6 (six) hours as needed by mouth for moderate pain.  . Wound Dressings (KERLIX SUPER SPONGE SALINE) PADS Apply 1 each daily topically.  . [DISCONTINUED] carbamide peroxide (DEBROX) 6.5 % OTIC solution Place 5 drops into both ears 2 (two) times daily.  . [DISCONTINUED] rosuvastatin (CRESTOR) 10 MG tablet TAKE 1 TABLET BY MOUTH EVERY DAY TO CONTROL CHOLESTEROL  . [DISCONTINUED] UNABLE TO FIND PT/INR Check on Friday 06/29/16, call or fax with results. Phone [phone], Fax [phone]  . [DISCONTINUED] warfarin (COUMADIN) 2.5 MG tablet Take 2.5 mg by mouth. Only  on Friday.  . [DISCONTINUED] warfarin (COUMADIN) 5 MG tablet Take 5 mg by mouth daily. Except for Friday.   No facility-administered encounter medications on file as of 10/18/2017.     Review of Systems  Constitutional: Negative for appetite change, diaphoresis, fatigue and fever.  HENT: Positive for hearing loss and rhinorrhea. Negative for congestion, mouth sores, nosebleeds and sore throat.        Has trouble swallowing large pills, no trouble with other food or liquids  Eyes: Negative for pain, discharge and itching.  Respiratory: Positive for shortness of breath. Negative for cough and wheezing.        Dyspnea on exertion  Cardiovascular: Positive for leg swelling. Negative for chest pain and palpitations.  Gastrointestinal: Negative for abdominal pain, blood in stool, constipation, diarrhea, nausea and vomiting.       Moved her bowels this am. No diarrhea. Had some nausea last night that has now resolved.   Genitourinary: Negative for dysuria.  Musculoskeletal: Positive for gait problem. Negative for back pain.  Skin: Negative for rash.  Neurological: Negative for dizziness and headaches.  Hematological: Bruises/bleeds easily.  Psychiatric/Behavioral: Negative for behavioral problems and confusion.    Vitals:   10/18/17 0920  BP: (!) 101/53  Pulse: 91  Resp: 18  Temp: 97.7 F (36.5 C)   TempSrc: Oral  Weight: 115 lb (52.2 kg)  Height: 5\' 2"  (1.575 m)   Body mass index is 21.03 kg/m.   Wt Readings from Last 3 Encounters:  10/18/17 115 lb (52.2 kg)  10/13/17 115 lb (52.2 kg)  08/28/17 122 lb 3.2 oz (55.4 kg)   Physical Exam  Constitutional: She is oriented to person, place, and time. No distress.  Thin built, frail, elderly female in no acute distress  HENT:  Head: Normocephalic and atraumatic.  Mouth/Throat: Oropharynx is clear and moist. No oropharyngeal exudate.  Eyes: Conjunctivae and EOM are normal. Pupils are equal, round, and reactive to light. Right eye exhibits no discharge. Left eye exhibits no discharge.  Neck: Normal range of motion. Neck supple.  Cardiovascular:  Irregular heart rate  Pulmonary/Chest: Effort normal and breath sounds normal. No respiratory distress. She has no wheezes. She has no rales. She exhibits no tenderness.  Pacemaker to left chest wall.   Abdominal: Soft. Bowel sounds are normal. There is no tenderness. There is no guarding.  Musculoskeletal: She exhibits edema and deformity.  Can move all 4 extremities, unsteady gait, needs help of 1-2 people for transfer, arthritis changes present  Lymphadenopathy:    She has no cervical adenopathy.  Neurological: She is alert and oriented to person, place, and time.  Skin: Skin is warm and dry. She is not diaphoretic.  Extensive bruise to her face and neck area, laceration to left forehead above left eyebrow with sutures/ healing well. Skin tear to both hands. bruise to arms. Abrasion to face and knees. Open skin area to right knee. 1+ edema to both feet, kerlix wrap to both legs above feet, tight dressing to both hands with swelling to fingers. Good radial pulse  Psychiatric: She has a normal mood and affect.    Labs reviewed: Basic Metabolic Panel: Recent Labs    03/04/17 0750 07/15/17 0750 08/22/17 0740  NA 138 140 140  K 4.3 4.3 4.1  CL 98 102 102  CO2 30 27 30   GLUCOSE 162*  135* 134*  BUN 45* 34* 34*  CREATININE 1.53* 1.29* 1.21*  CALCIUM 9.1 8.5* 8.5*   Liver Function Tests: Recent  Labs    03/04/17 0750 07/15/17 0750 08/22/17 0740  AST 19 17 17   ALT 12 11 9   ALKPHOS 56 51  --   BILITOT 0.8 0.5 0.4  PROT 6.0* 5.0* 5.3*  ALBUMIN 3.5* 2.9*  --    No results for input(s): LIPASE, AMYLASE in the last 8760 hours. No results for input(s): AMMONIA in the last 8760 hours. CBC: Recent Labs    08/22/17 0740  WBC 8.4  NEUTROABS 5,149  HGB 11.5*  HCT 36.3  MCV 85.2  PLT 217   Cardiac Enzymes: No results for input(s): CKTOTAL, CKMB, CKMBINDEX, TROPONINI in the last 8760 hours. BNP: Invalid input(s): POCBNP Lab Results  Component Value Date   HGBA1C 7.3 (H) 07/15/2017   Lab Results  Component Value Date   TSH 3.23 11/02/2013   No results found for: VITAMINB12 No results found for: FOLATE No results found for: IRON, TIBC, FERRITIN  Imaging and Procedures obtained prior to SNF admission: Ct Head Wo Contrast  Result Date: 10/13/2017 CLINICAL DATA:  Facial trauma post fall. EXAM: CT HEAD WITHOUT CONTRAST CT MAXILLOFACIAL WITHOUT CONTRAST CT CERVICAL SPINE WITHOUT CONTRAST TECHNIQUE: Multidetector CT imaging of the head, cervical spine, and maxillofacial structures were performed using the standard protocol without intravenous contrast. Multiplanar CT image reconstructions of the cervical spine and maxillofacial structures were also generated. COMPARISON:  Head CT 12/04/2007 FINDINGS: CT HEAD FINDINGS Brain: No evidence of acute infarction, hemorrhage, hydrocephalus, extra-axial collection or mass lesion/mass effect. Moderate to advanced brain parenchymal atrophy and periventricular microangiopathy. Vascular: No hyperdense vessel or unexpected calcification. Skull: Normal. Negative for fracture or focal lesion. Other: Left frontal scalp hematoma. CT MAXILLOFACIAL FINDINGS Osseous: Mildly comminuted left nasal fracture with possible extension to the  anterior septum. Orbits: No evidence of orbital fractures. Sinuses: Minimal fluid level within the right maxillary sinus, the remainder of the paranasal sinuses are well aerated. Soft tissues: Left frontal and preseptal periorbital hematoma. The postseptal elements of the left orbit are intact. CT CERVICAL SPINE FINDINGS Alignment: Normal. Skull base and vertebrae: No acute fracture. No primary bone lesion or focal pathologic process. Soft tissues and spinal canal: No prevertebral fluid or swelling. No visible canal hematoma. Disc levels:  Multilevel osteoarthritic changes, moderate to severe. Upper chest: Bilateral full effusions. Probable interstitial pulmonary edema. Calcified pleuroparenchymal scarring in the bilateral apices. Other: None. IMPRESSION: No acute intracranial abnormality. Atrophy, chronic microvascular disease. No evidence of acute traumatic injury to cervical spine. Mildly comminuted left nasal fracture with possible extension to the anterior septum. Left frontal and preseptal periorbital hematoma. The postseptal elements of the left orbit are intact. Electronically Signed   By: Fidela Salisbury M.D.   On: 10/13/2017 16:54   Ct Cervical Spine Wo Contrast  Result Date: 10/13/2017 CLINICAL DATA:  Facial trauma post fall. EXAM: CT HEAD WITHOUT CONTRAST CT MAXILLOFACIAL WITHOUT CONTRAST CT CERVICAL SPINE WITHOUT CONTRAST TECHNIQUE: Multidetector CT imaging of the head, cervical spine, and maxillofacial structures were performed using the standard protocol without intravenous contrast. Multiplanar CT image reconstructions of the cervical spine and maxillofacial structures were also generated. COMPARISON:  Head CT 12/04/2007 FINDINGS: CT HEAD FINDINGS Brain: No evidence of acute infarction, hemorrhage, hydrocephalus, extra-axial collection or mass lesion/mass effect. Moderate to advanced brain parenchymal atrophy and periventricular microangiopathy. Vascular: No hyperdense vessel or unexpected  calcification. Skull: Normal. Negative for fracture or focal lesion. Other: Left frontal scalp hematoma. CT MAXILLOFACIAL FINDINGS Osseous: Mildly comminuted left nasal fracture with possible extension to the anterior septum. Orbits: No  evidence of orbital fractures. Sinuses: Minimal fluid level within the right maxillary sinus, the remainder of the paranasal sinuses are well aerated. Soft tissues: Left frontal and preseptal periorbital hematoma. The postseptal elements of the left orbit are intact. CT CERVICAL SPINE FINDINGS Alignment: Normal. Skull base and vertebrae: No acute fracture. No primary bone lesion or focal pathologic process. Soft tissues and spinal canal: No prevertebral fluid or swelling. No visible canal hematoma. Disc levels:  Multilevel osteoarthritic changes, moderate to severe. Upper chest: Bilateral full effusions. Probable interstitial pulmonary edema. Calcified pleuroparenchymal scarring in the bilateral apices. Other: None. IMPRESSION: No acute intracranial abnormality. Atrophy, chronic microvascular disease. No evidence of acute traumatic injury to cervical spine. Mildly comminuted left nasal fracture with possible extension to the anterior septum. Left frontal and preseptal periorbital hematoma. The postseptal elements of the left orbit are intact. Electronically Signed   By: Fidela Salisbury M.D.   On: 10/13/2017 16:54   Dg Hand Complete Right  Result Date: 10/13/2017 CLINICAL DATA:  Fall EXAM: RIGHT HAND - COMPLETE 3+ VIEW COMPARISON:  None. FINDINGS: No evidence of fracture of the carpal or metacarpal bones. Radiocarpal joint is intact. Phalanges are normal. No soft tissue injury. IMPRESSION: No fracture or dislocation. Electronically Signed   By: Suzy Bouchard M.D.   On: 10/13/2017 15:48   Ct Maxillofacial Wo Contrast  Result Date: 10/13/2017 CLINICAL DATA:  Facial trauma post fall. EXAM: CT HEAD WITHOUT CONTRAST CT MAXILLOFACIAL WITHOUT CONTRAST CT CERVICAL SPINE  WITHOUT CONTRAST TECHNIQUE: Multidetector CT imaging of the head, cervical spine, and maxillofacial structures were performed using the standard protocol without intravenous contrast. Multiplanar CT image reconstructions of the cervical spine and maxillofacial structures were also generated. COMPARISON:  Head CT 12/04/2007 FINDINGS: CT HEAD FINDINGS Brain: No evidence of acute infarction, hemorrhage, hydrocephalus, extra-axial collection or mass lesion/mass effect. Moderate to advanced brain parenchymal atrophy and periventricular microangiopathy. Vascular: No hyperdense vessel or unexpected calcification. Skull: Normal. Negative for fracture or focal lesion. Other: Left frontal scalp hematoma. CT MAXILLOFACIAL FINDINGS Osseous: Mildly comminuted left nasal fracture with possible extension to the anterior septum. Orbits: No evidence of orbital fractures. Sinuses: Minimal fluid level within the right maxillary sinus, the remainder of the paranasal sinuses are well aerated. Soft tissues: Left frontal and preseptal periorbital hematoma. The postseptal elements of the left orbit are intact. CT CERVICAL SPINE FINDINGS Alignment: Normal. Skull base and vertebrae: No acute fracture. No primary bone lesion or focal pathologic process. Soft tissues and spinal canal: No prevertebral fluid or swelling. No visible canal hematoma. Disc levels:  Multilevel osteoarthritic changes, moderate to severe. Upper chest: Bilateral full effusions. Probable interstitial pulmonary edema. Calcified pleuroparenchymal scarring in the bilateral apices. Other: None. IMPRESSION: No acute intracranial abnormality. Atrophy, chronic microvascular disease. No evidence of acute traumatic injury to cervical spine. Mildly comminuted left nasal fracture with possible extension to the anterior septum. Left frontal and preseptal periorbital hematoma. The postseptal elements of the left orbit are intact. Electronically Signed   By: Fidela Salisbury M.D.    On: 10/13/2017 16:54    Assessment/Plan  Generalized weakness With recent fall. Will have patient work with PT/OT as tolerated to regain strength and restore function.  Fall precautions are in place  Unsteady gait Will have her work with physical therapy and occupational therapy team to help with gait training and muscle strengthening exercises.fall precautions. Skin care. Encourage to be out of bed. Encouraged to use assistive device.   Laceration subsequent encounter Has sutures in place, healing  well. Removal of suture on 10/22/17. Order placed for nursing to remove.   Bruisability Easy bruising plus with recent fall, has extensive bruising, resolving, monitor  Skin tear To both hands and right knee. Clean area with NS, apply triple antibiotic ointment and non adherent dressing with gauze wrap. Change daily and as needed.  supratherapeutic INR Coumadin to be held for now. Check INR today to adjust dosing further.   HTN Soft BP reading this am. Denies any symptom. With her unsteady gait and recent fall, monitor BP bid for now. Add holding parameter to her antihypertensives. Continue metoprolol tartate, spironolactone and furosemide for now.   DM type 2 with renal complications Check cbg bid for now. Continue glimepiride and metformin for now. Check a1c. Continue statin.  Left nasal fracture breathing stable. Continue tramadol 25 mg q6h prn pain.   PVD D/c kerlix wrap with the foot edema. Swelling subsided since last clinic visit. Compression stocking to both legs. Continue kcl supplement.   afib Controlled HR. Continue metoprolol tartrate for rate control. Hold coumadin with supratherapeutic INR.   CKD stage 3 Monitor BMP.   Dysphagia With large pills. Alert and oriented. Have SLP to evaluate. Aspiration precautions.   Family/ staff Communication: reviewed care plan with patient and charge nurse.    Labs/tests ordered: cbc with diff, cmp, a1c  Blanchie Serve,  MD Internal Medicine [facility] [address] [place], [place] [address] Cell Phone (Monday-Friday 8 am - 5 pm): [phone] On Call: [phone] and follow prompts after 5 pm and on weekends Office Phone: [phone] Office Fax: [phone]

## 2017-10-20 DIAGNOSIS — R2681 Unsteadiness on feet: Secondary | ICD-10-CM | POA: Diagnosis not present

## 2017-10-20 DIAGNOSIS — M6281 Muscle weakness (generalized): Secondary | ICD-10-CM | POA: Diagnosis not present

## 2017-10-20 DIAGNOSIS — R1310 Dysphagia, unspecified: Secondary | ICD-10-CM | POA: Diagnosis not present

## 2017-10-21 DIAGNOSIS — E118 Type 2 diabetes mellitus with unspecified complications: Secondary | ICD-10-CM | POA: Diagnosis not present

## 2017-10-21 DIAGNOSIS — R1310 Dysphagia, unspecified: Secondary | ICD-10-CM | POA: Diagnosis not present

## 2017-10-21 DIAGNOSIS — I10 Essential (primary) hypertension: Secondary | ICD-10-CM | POA: Diagnosis not present

## 2017-10-21 DIAGNOSIS — M6281 Muscle weakness (generalized): Secondary | ICD-10-CM | POA: Diagnosis not present

## 2017-10-21 DIAGNOSIS — I509 Heart failure, unspecified: Secondary | ICD-10-CM | POA: Diagnosis not present

## 2017-10-21 DIAGNOSIS — R2681 Unsteadiness on feet: Secondary | ICD-10-CM | POA: Diagnosis not present

## 2017-10-21 LAB — CBC
HCT: 31.7
HEMOGLOBIN: 10.2
PLATELET COUNT: 225
WBC: 7.6

## 2017-10-21 LAB — COMPLETE METABOLIC PANEL WITH GFR
ALBUMIN: 2.3
ALK PHOS: 56
ALT: 12
AST: 17
BUN: 45 — AB (ref 4–21)
Calcium: 7.8
Creat: 1.41
Glucose: 117
POTASSIUM: 4.4
SODIUM: 137
Total Bilirubin: 0.6
Total Protein: 4.4 g/dL

## 2017-10-21 LAB — HEMOGLOBIN A1C: A1c: 7.7

## 2017-10-22 ENCOUNTER — Telehealth: Payer: Self-pay | Admitting: *Deleted

## 2017-10-22 DIAGNOSIS — R2681 Unsteadiness on feet: Secondary | ICD-10-CM | POA: Diagnosis not present

## 2017-10-22 DIAGNOSIS — R1310 Dysphagia, unspecified: Secondary | ICD-10-CM | POA: Diagnosis not present

## 2017-10-22 DIAGNOSIS — M6281 Muscle weakness (generalized): Secondary | ICD-10-CM | POA: Diagnosis not present

## 2017-10-22 NOTE — Telephone Encounter (Signed)
[facility] due to the pt being overdue for a an INR check & they stated the pt is not in [facility] anymore since her fall & is residing in the [place] area due to pt needing more care. Spoke with [place], LPN & she stated Dr. Bubba Camp is managing INR & completing Coumadin dosing. They will let us know when pt is back in Independent Living so we can resume care.

## 2017-10-23 ENCOUNTER — Encounter: Payer: Self-pay | Admitting: Family

## 2017-10-23 ENCOUNTER — Non-Acute Institutional Stay (SKILLED_NURSING_FACILITY): Payer: Medicare Other | Admitting: Family

## 2017-10-23 DIAGNOSIS — I509 Heart failure, unspecified: Secondary | ICD-10-CM | POA: Diagnosis not present

## 2017-10-23 DIAGNOSIS — E8809 Other disorders of plasma-protein metabolism, not elsewhere classified: Secondary | ICD-10-CM | POA: Diagnosis not present

## 2017-10-23 DIAGNOSIS — E440 Moderate protein-calorie malnutrition: Secondary | ICD-10-CM

## 2017-10-23 DIAGNOSIS — D509 Iron deficiency anemia, unspecified: Secondary | ICD-10-CM

## 2017-10-23 DIAGNOSIS — S61412D Laceration without foreign body of left hand, subsequent encounter: Secondary | ICD-10-CM

## 2017-10-23 NOTE — Progress Notes (Addendum)
Location:  [facility]:  SNF (31) Provider: Hagen Tidd FNP-C  Blanchie Serve, MD  Patient Care Team: Blanchie Serve, MD as PCP - General (Internal Medicine) [place]ique, Peter M, MD as Consulting Physician (Cardiology) [place], Espy, Nelda Bucks, NP as Nurse Practitioner (Family Medicine)  Extended Emergency Contact Information Primary Emergency Contact: Allen,Dana Address: [address]          [place], [place] [address] [place] Mobile Phone: [phone] Relation: Daughter Secondary Emergency Contact: Edson Snowball [place] Phone: [phone] Relation: None  Code Status:  Full Code  Goals of care: Advanced Directive information Advanced Directives 10/23/2017  Does Patient Have a Medical Advance Directive? Yes  Type of Paramedic of [place];Living will  Does patient want to make changes to medical advance directive? -  Copy of [place] in Chart? Yes     Chief Complaint  Patient presents with  . Acute Visit    Abnormal labs    HPI:  Pt is a 83 y.o. female seen today at [facility] for an acute visit for evaluation of abnormal lab results.She seen in her room today.Her recent lab results CR 1.41,BUN 45,Ca 7.8,TP 4.4,Alb 2.3, Hgb 10.2 (10/22/2017). She complains of swelling on her hands worst on the left hand.She states has not been able to take her fluid pill due to low blood pressure.Facility Nurse states patient's Furosemide held due to SBP < 110. She states feeling much better and has a good appetite. Her weight log reviewed has had weight gain; wt 115 lbs (10/16/2017); wt 120.3 lbs (10/23/2017.     Past Medical History:  Diagnosis Date  . Atrial fibrillation ([place])    persistent afib/ atrial flutter  . Brain atrophy 2009  . Congestive heart failure (CHF) ([place])   . Eczema   . Edema   . Hyperlipidemia   . Hypertension     . Kidney disease, chronic, stage III (GFR 30-59 ml/min) ([facility])   . Macular degeneration 2012   left   . Sick sinus syndrome ([place])    post DDD pacemaker  . Skin cancer of nose 2012   with skin graft  . Type II or unspecified type diabetes mellitus with renal manifestations, not stated as uncontrolled(250.40)    type 2  . Xerophthalmia 2012  . Xerostomia 2012   Past Surgical History:  Procedure Laterality Date  . ANKLE SURGERY Right 1990   fracture as steel plate   . CARDIAC VALVE SURGERY  2005   Dr. Ricard Dillon  . CATARACT EXTRACTION W/ INTRAOCULAR LENS IMPLANT Left 11/11/14   [place]  2008  . EYE SURGERY    . KNEE SURGERY Right 1990   fracture has plate and rod in femur  . PACEMAKER GENERATOR CHANGE  07/19/2014   Dr. Rayann Heman MDT  . PACEMAKER INSERTION  2005; 07-19-14   MDT ADDRL1 pacemaker generator change by Dr Rayann Heman [id number]  . PERMANENT PACEMAKER GENERATOR CHANGE N/A 07/19/2014   Procedure: PERMANENT PACEMAKER GENERATOR CHANGE;  Surgeon: Coralyn Mark, MD;  Location: [facility] CATH LAB;  Service: Cardiovascular;  Laterality: N/A;    Allergies  Allergen Reactions  . Penicillins Other (See Comments)    Temperature went up to 104 F  . Amiodarone   . Penicillin G Benzathine & Proc Other (See Comments)    Unknown  . Other Rash    MULTAG  Outpatient Encounter Medications as of 10/23/2017  Medication Sig  . atorvastatin (LIPITOR) 20 MG tablet Take 20 mg daily by mouth.  . furosemide (LASIX) 40 MG tablet Take 1 tablet (40 mg total) by mouth 2 (two) times daily. Take one at 9AM and one at [facility] for CHF/Edema  . glimepiride (AMARYL) 2 MG tablet TAKE ONE TABLET BY MOUTH ONCE DAILY WITH BREAKFAST TO CONTROL BLOOD SUGAR  . metFORMIN (GLUCOPHAGE) 500 MG tablet TAKE 1 TABLET TWICE DAILY TO CONTROL GLUCOSE  . metoprolol tartrate (LOPRESSOR) 25 MG tablet TAKE 1 TABLET BY MOUTH TWICE A DAY TO CONTROL BLOOD PRESSURE  . Multiple Vitamins-Minerals (MULTIVITAMIN PO) Take 1  tablet by mouth daily.  . potassium chloride SA (K-DUR,KLOR-CON) 20 MEQ tablet Take 1 tablet (20 mEq total) by mouth daily.  Marland Kitchen spironolactone (ALDACTONE) 25 MG tablet TAKE 1 TABLET BY MOUTH EVERY DAY  . traMADol (ULTRAM) 50 MG tablet Take 25 mg every 6 (six) hours as needed by mouth for moderate pain.  Marland Kitchen warfarin (COUMADIN) 5 MG tablet Take 5 mg by mouth as directed. 5mg  daily every day except on Fridays; 2.5mg  on Fridays only  . Wound Dressings (KERLIX SUPER SPONGE SALINE) PADS Apply 1 each daily topically.   No facility-administered encounter medications on file as of 10/23/2017.     Review of Systems  Constitutional: Negative for activity change, chills, fatigue and fever.  Respiratory: Negative for cough, chest tightness, shortness of breath and wheezing.   Cardiovascular: Positive for leg swelling. Negative for chest pain and palpitations.  Gastrointestinal: Negative for abdominal distention, abdominal pain, constipation, diarrhea and vomiting.  Genitourinary: Negative for dysuria, frequency and urgency.  Musculoskeletal: Positive for gait problem.  Skin: Negative for color change, pallor and rash.  Neurological: Negative for dizziness, seizures, syncope, light-headedness and headaches.  Psychiatric/Behavioral: Negative for agitation, confusion and sleep disturbance. The patient is not nervous/anxious.     Immunization History  Administered Date(s) Administered  . Influenza Whole 09/02/2012, 09/05/2013  . Influenza, High Dose Seasonal PF 09/11/2017  . Influenza-Unspecified 09/16/2014, 09/01/2015, 09/21/2016  . Pneumococcal Polysaccharide-23 12/03/2002  . Td 12/03/2005  . Tdap 10/13/2017   Pertinent  Health Maintenance Due  Topic Date Due  . PNA vac Low Risk Adult (2 of 2 - PCV13) 12/04/2003  . FOOT EXAM  12/03/2017 (Originally 11/17/2015)  . MAMMOGRAM  12/03/2017 (Originally [birth year]/1942)  . OPHTHALMOLOGY EXAM  12/03/2017 (Originally 10/13/1934)  . DEXA SCAN  12/03/2017  (Originally 10/13/1989)  . HEMOGLOBIN A1C  02/[facility]/2019  . URINE MICROALBUMIN  08/[facility]/2019  . INFLUENZA VACCINE  Completed   Fall Risk  03/12/2017 04/03/2016 03/20/2016 11/16/2014 07/07/2013  Falls in the past year? No No No No No    Vitals:   10/23/17 1021  BP: 100/60  Pulse: 88  Resp: 18  Temp: (!) 96.6 F (35.9 C)  SpO2: 98%  Weight: 115 lb (52.2 kg)  Height: 5\' 2"  (1.575 m)   Body mass index is 21.03 kg/m. Physical Exam  Constitutional: She is oriented to person, place, and time.  Frail elderly in no acute distress   HENT:  Head: Normocephalic.  Mouth/Throat: Oropharynx is clear and moist. No oropharyngeal exudate.  Eyes: Conjunctivae and EOM are normal. Pupils are equal, round, and reactive to light. Right eye exhibits no discharge. Left eye exhibits no discharge. No scleral icterus.  Neck: Normal range of motion. No JVD present. No thyromegaly present.  Cardiovascular: Normal rate, regular rhythm, normal heart sounds and intact distal pulses. Exam reveals no gallop  and no friction rub.  No murmur heard. Pulmonary/Chest: Effort normal and breath sounds normal. No respiratory distress. She has no wheezes. She has no rales.  Abdominal: Soft. Bowel sounds are normal. She exhibits no distension. There is no tenderness. There is no rebound and no guarding.  Musculoskeletal: She exhibits no tenderness.  Moves x 4 extremities.Unsteady gait uses FWW.bilateral UE/LE edema 2-3+   Lymphadenopathy:    She has no cervical adenopathy.  Neurological: She is oriented to person, place, and time.  Skin: Skin is warm and dry. No rash noted. No erythema.  Bruises to face and neck progressive healing.Laceration to left eye brow healed.Right dorsal hand skin tear progressive healing.Left dorsal hand skin tear dressing with small amounts of brown drainage noted.   Psychiatric: She has a normal mood and affect.   Labs reviewed: Recent Labs    03/04/17 0750 08/[facility]/18 0750 08/22/17 0740 10/21/17    NA 138 140 140 137  K 4.3 4.3 4.1 4.4  CL 98 102 102  --   CO2 30 27 30   --   GLUCOSE 162* 135* 134*  --   BUN 45* 34* 34* 45*  CREATININE 1.53* 1.29* 1.21* 1.41  CALCIUM 9.1 8.5* 8.5* 7.8   Recent Labs    03/04/17 0750 08/[facility]/18 0750 08/22/17 0740 10/21/17  AST 19 17 17 17   ALT 12 11 9 12   ALKPHOS 56 51  --  56  BILITOT 0.8 0.5 0.4 0.6  PROT 6.0* 5.0* 5.3* 4.4  ALBUMIN 3.5* 2.9*  --  2.3   Recent Labs    08/22/17 0740 10/21/17  WBC 8.4 7.6  NEUTROABS 5,149  --   HGB 11.5* 10.2  HCT 36.3 31.7  MCV 85.2  --   PLT 217  --    Lab Results  Component Value Date   TSH 3.23 11/02/2013   Lab Results  Component Value Date   HGBA1C 7.3 (H) 08/[facility]/2018   Lab Results  Component Value Date   CHOL 111 08/[facility]/2018   HDL 42 (L) 08/[facility]/2018   LDLCALC 46 08/[facility]/2018   TRIG 113 08/[facility]/2018   CHOLHDL 2.6 08/[facility]/2018    Significant Diagnostic Results in last 30 days:  Ct Head Wo Contrast  Result Date: 10/13/2017 CLINICAL DATA:  Facial trauma post fall. EXAM: CT HEAD WITHOUT CONTRAST CT MAXILLOFACIAL WITHOUT CONTRAST CT CERVICAL SPINE WITHOUT CONTRAST TECHNIQUE: Multidetector CT imaging of the head, cervical spine, and maxillofacial structures were performed using the standard protocol without intravenous contrast. Multiplanar CT image reconstructions of the cervical spine and maxillofacial structures were also generated. COMPARISON:  Head CT 12/04/2007 FINDINGS: CT HEAD FINDINGS Brain: No evidence of acute infarction, hemorrhage, hydrocephalus, extra-axial collection or mass lesion/mass effect. Moderate to advanced brain parenchymal atrophy and periventricular microangiopathy. Vascular: No hyperdense vessel or unexpected calcification. Skull: Normal. Negative for fracture or focal lesion. Other: Left frontal scalp hematoma. CT MAXILLOFACIAL FINDINGS Osseous: Mildly comminuted left nasal fracture with possible extension to the anterior septum. Orbits: No evidence of orbital fractures.  Sinuses: Minimal fluid level within the right maxillary sinus, the remainder of the paranasal sinuses are well aerated. Soft tissues: Left frontal and preseptal periorbital hematoma. The postseptal elements of the left orbit are intact. CT CERVICAL SPINE FINDINGS Alignment: Normal. Skull base and vertebrae: No acute fracture. No primary bone lesion or focal pathologic process. Soft tissues and spinal canal: No prevertebral fluid or swelling. No visible canal hematoma. Disc levels:  Multilevel osteoarthritic changes, moderate to severe. Upper chest: Bilateral full effusions.  Probable interstitial pulmonary edema. Calcified pleuroparenchymal scarring in the bilateral apices. Other: None. IMPRESSION: No acute intracranial abnormality. Atrophy, chronic microvascular disease. No evidence of acute traumatic injury to cervical spine. Mildly comminuted left nasal fracture with possible extension to the anterior septum. Left frontal and preseptal periorbital hematoma. The postseptal elements of the left orbit are intact. Electronically Signed   By: Fidela Salisbury M.D.   On: 10/13/2017 16:54   Ct Cervical Spine Wo Contrast  Result Date: 10/13/2017 CLINICAL DATA:  Facial trauma post fall. EXAM: CT HEAD WITHOUT CONTRAST CT MAXILLOFACIAL WITHOUT CONTRAST CT CERVICAL SPINE WITHOUT CONTRAST TECHNIQUE: Multidetector CT imaging of the head, cervical spine, and maxillofacial structures were performed using the standard protocol without intravenous contrast. Multiplanar CT image reconstructions of the cervical spine and maxillofacial structures were also generated. COMPARISON:  Head CT 12/04/2007 FINDINGS: CT HEAD FINDINGS Brain: No evidence of acute infarction, hemorrhage, hydrocephalus, extra-axial collection or mass lesion/mass effect. Moderate to advanced brain parenchymal atrophy and periventricular microangiopathy. Vascular: No hyperdense vessel or unexpected calcification. Skull: Normal. Negative for fracture or focal  lesion. Other: Left frontal scalp hematoma. CT MAXILLOFACIAL FINDINGS Osseous: Mildly comminuted left nasal fracture with possible extension to the anterior septum. Orbits: No evidence of orbital fractures. Sinuses: Minimal fluid level within the right maxillary sinus, the remainder of the paranasal sinuses are well aerated. Soft tissues: Left frontal and preseptal periorbital hematoma. The postseptal elements of the left orbit are intact. CT CERVICAL SPINE FINDINGS Alignment: Normal. Skull base and vertebrae: No acute fracture. No primary bone lesion or focal pathologic process. Soft tissues and spinal canal: No prevertebral fluid or swelling. No visible canal hematoma. Disc levels:  Multilevel osteoarthritic changes, moderate to severe. Upper chest: Bilateral full effusions. Probable interstitial pulmonary edema. Calcified pleuroparenchymal scarring in the bilateral apices. Other: None. IMPRESSION: No acute intracranial abnormality. Atrophy, chronic microvascular disease. No evidence of acute traumatic injury to cervical spine. Mildly comminuted left nasal fracture with possible extension to the anterior septum. Left frontal and preseptal periorbital hematoma. The postseptal elements of the left orbit are intact. Electronically Signed   By: Fidela Salisbury M.D.   On: 10/13/2017 16:54   Dg Hand Complete Right  Result Date: 10/13/2017 CLINICAL DATA:  Fall EXAM: RIGHT HAND - COMPLETE 3+ VIEW COMPARISON:  None. FINDINGS: No evidence of fracture of the carpal or metacarpal bones. Radiocarpal joint is intact. Phalanges are normal. No soft tissue injury. IMPRESSION: No fracture or dislocation. Electronically Signed   By: Suzy Bouchard M.D.   On: 10/13/2017 15:48   Ct Maxillofacial Wo Contrast  Result Date: 10/13/2017 CLINICAL DATA:  Facial trauma post fall. EXAM: CT HEAD WITHOUT CONTRAST CT MAXILLOFACIAL WITHOUT CONTRAST CT CERVICAL SPINE WITHOUT CONTRAST TECHNIQUE: Multidetector CT imaging of the head,  cervical spine, and maxillofacial structures were performed using the standard protocol without intravenous contrast. Multiplanar CT image reconstructions of the cervical spine and maxillofacial structures were also generated. COMPARISON:  Head CT 12/04/2007 FINDINGS: CT HEAD FINDINGS Brain: No evidence of acute infarction, hemorrhage, hydrocephalus, extra-axial collection or mass lesion/mass effect. Moderate to advanced brain parenchymal atrophy and periventricular microangiopathy. Vascular: No hyperdense vessel or unexpected calcification. Skull: Normal. Negative for fracture or focal lesion. Other: Left frontal scalp hematoma. CT MAXILLOFACIAL FINDINGS Osseous: Mildly comminuted left nasal fracture with possible extension to the anterior septum. Orbits: No evidence of orbital fractures. Sinuses: Minimal fluid level within the right maxillary sinus, the remainder of the paranasal sinuses are well aerated. Soft tissues: Left frontal and preseptal  periorbital hematoma. The postseptal elements of the left orbit are intact. CT CERVICAL SPINE FINDINGS Alignment: Normal. Skull base and vertebrae: No acute fracture. No primary bone lesion or focal pathologic process. Soft tissues and spinal canal: No prevertebral fluid or swelling. No visible canal hematoma. Disc levels:  Multilevel osteoarthritic changes, moderate to severe. Upper chest: Bilateral full effusions. Probable interstitial pulmonary edema. Calcified pleuroparenchymal scarring in the bilateral apices. Other: None. IMPRESSION: No acute intracranial abnormality. Atrophy, chronic microvascular disease. No evidence of acute traumatic injury to cervical spine. Mildly comminuted left nasal fracture with possible extension to the anterior septum. Left frontal and preseptal periorbital hematoma. The postseptal elements of the left orbit are intact. Electronically Signed   By: Fidela Salisbury M.D.   On: 10/13/2017 16:54   Assessment/Plan 1. Moderate  protein-calorie malnutrition  TP 4.4 (10/22/2017).Possible due to oral intake.Facility registered dietician to evaluate for protein supplements.continue on Multivitamin daily.Continue to encourage oral intake.Recheck CMP 11/04/2017  2. Hypocalcemia Ca 7.8 (10/22/2017).suspect dietary related.start cal citrate  950 mg tablet once daily. Monitor calcium level.continue to encourage oral intake.    3. Hypoalbuminemia Alb 2.3 (10/22/2017).Registered dietician to evaluate for protein supplements.Recheck CMP 11/04/2017.   4. Iron deficiency anemia Hgb 10.2 (10/22/2017).Status post hospital admission post fall with multiple bruises and abrasion requiring suturing. Therefore suspect blood loss.will recheck CBC 11/04/2017 then initiated ferrous sulfate or work up for anemia if no improvement.   5. CHF wt 115 lbs (10/16/2017)  wt 120.3 lbs (10/23/2017 Exam findings negative for cough, wheezing, rales or shortness of breath.She has not had her Furosemide due to SBP< 110.Patient's Furosemide and metoprolol SBP parameters discussed with Dr.Pandey.With her increase edema and weight gain will change parameters to hold furosemide and metoprolol for SBP< or equal 90/60 or HR < 60 b/min.Continue to monitor.  Family/ staff Communication: Reviewed plan of care with patient and facility Nurse supervisor  Labs/tests ordered: CBC/diff, CMP 11/04/2017  Sandrea Hughs, NP

## 2017-10-24 DIAGNOSIS — R2681 Unsteadiness on feet: Secondary | ICD-10-CM | POA: Diagnosis not present

## 2017-10-24 DIAGNOSIS — R1310 Dysphagia, unspecified: Secondary | ICD-10-CM | POA: Diagnosis not present

## 2017-10-24 DIAGNOSIS — M6281 Muscle weakness (generalized): Secondary | ICD-10-CM | POA: Diagnosis not present

## 2017-10-27 DIAGNOSIS — R1310 Dysphagia, unspecified: Secondary | ICD-10-CM | POA: Diagnosis not present

## 2017-10-27 DIAGNOSIS — R2681 Unsteadiness on feet: Secondary | ICD-10-CM | POA: Diagnosis not present

## 2017-10-27 DIAGNOSIS — M6281 Muscle weakness (generalized): Secondary | ICD-10-CM | POA: Diagnosis not present

## 2017-10-28 ENCOUNTER — Non-Acute Institutional Stay (SKILLED_NURSING_FACILITY): Payer: Medicare Other | Admitting: Family

## 2017-10-28 ENCOUNTER — Encounter: Payer: Self-pay | Admitting: Family

## 2017-10-28 DIAGNOSIS — Z7901 Long term (current) use of anticoagulants: Secondary | ICD-10-CM

## 2017-10-28 DIAGNOSIS — L03119 Cellulitis of unspecified part of limb: Secondary | ICD-10-CM | POA: Diagnosis not present

## 2017-10-28 DIAGNOSIS — R2681 Unsteadiness on feet: Secondary | ICD-10-CM | POA: Diagnosis not present

## 2017-10-28 DIAGNOSIS — R1310 Dysphagia, unspecified: Secondary | ICD-10-CM | POA: Diagnosis not present

## 2017-10-28 DIAGNOSIS — M6281 Muscle weakness (generalized): Secondary | ICD-10-CM | POA: Diagnosis not present

## 2017-10-28 DIAGNOSIS — I4891 Unspecified atrial fibrillation: Secondary | ICD-10-CM | POA: Diagnosis not present

## 2017-10-28 NOTE — Progress Notes (Signed)
Location:  [place] Room Number: 13 Place of Service:  SNF (31) Provider: Dinah Ngetich FNP-C  Blanchie Serve, MD  Patient Care Team: Blanchie Serve, MD as PCP - General (Internal Medicine) [place], Peter M, MD as Consulting Physician (Cardiology) [place], [place], Nelda Bucks, NP as Nurse Practitioner (Family Medicine)  Extended Emergency Contact Information Primary Emergency Contact: Allen,Dana Address: [address]          [place], [place] [address] [place] Mobile Phone: [phone] Relation: Daughter Secondary Emergency Contact: Edson Snowball [place] Phone: [phone] Relation: None  Code Status:  DNR Goals of care: Advanced Directive information Advanced Directives 10/28/2017  Does Patient Have a Medical Advance Directive? Yes  Type of Paramedic of [place];Living will  Does patient want to make changes to medical advance directive? -  Copy of [place] in Chart? Yes     Chief Complaint  Patient presents with  . Acute Visit    ? cellulitis to arm; also having trouble controlling bleeding-staff asks for INR venipuncture    HPI:  Pt is a 83 y.o. female seen today at [facility] for an acute visit for evaluation of redness on arms.She is seen in her room today per facility Nurse request.Nurse reports patient arm redness.Both arm skin tears with yellow drainage. She denies any fever,chills or pain. Bilateral arm and leg dressing continues to be managed by facility Nurse.Nurse also reports patient's INR reading error request INR to be checked via venipuncture.      Past Medical History:  Diagnosis Date  . Atrial fibrillation ([place])    persistent afib/ atrial flutter  . Brain atrophy 2009  . Congestive heart failure (CHF) ([place])   . Eczema   . Edema   . Hyperlipidemia   . Hypertension   . Kidney disease, chronic, stage III (GFR 30-59 ml/min) ([facility])     . Macular degeneration 2012   left   . Sick sinus syndrome ([place])    post DDD pacemaker  . Skin cancer of nose 2012   with skin graft  . Type II or unspecified type diabetes mellitus with renal manifestations, not stated as uncontrolled(250.40)    type 2  . Xerophthalmia 2012  . Xerostomia 2012   Past Surgical History:  Procedure Laterality Date  . ANKLE SURGERY Right 1990   fracture as steel plate   . CARDIAC VALVE SURGERY  2005   Dr. Ricard Dillon  . CATARACT EXTRACTION W/ INTRAOCULAR LENS IMPLANT Left 11/11/14   [place]  2008  . EYE SURGERY    . KNEE SURGERY Right 1990   fracture has plate and rod in femur  . PACEMAKER GENERATOR CHANGE  07/19/2014   Dr. Rayann Heman MDT  . PACEMAKER INSERTION  2005; 07-19-14   MDT ADDRL1 pacemaker generator change by Dr Rayann Heman [phone]  . PERMANENT PACEMAKER GENERATOR CHANGE N/A 07/19/2014   Procedure: PERMANENT PACEMAKER GENERATOR CHANGE;  Surgeon: Coralyn Mark, MD;  Location: [place] CATH LAB;  Service: Cardiovascular;  Laterality: N/A;    Allergies  Allergen Reactions  . Penicillins Other (See Comments)    Temperature went up to 104 F  . Amiodarone   . Penicillin G Benzathine & Proc Other (See Comments)    Unknown  . Other Rash    MULTAG    Outpatient Encounter Medications as of 10/28/2017  Medication Sig  . atorvastatin (LIPITOR) 20 MG tablet Take 20  mg daily by mouth.  . calcium citrate (CALCITRATE - DOSED IN MG ELEMENTAL CALCIUM) 950 MG tablet Take 200 mg of elemental calcium by mouth daily.  . furosemide (LASIX) 40 MG tablet Take 1 tablet (40 mg total) by mouth 2 (two) times daily. Take one at 9AM and one at [facility] for CHF/Edema  . glimepiride (AMARYL) 2 MG tablet TAKE ONE TABLET BY MOUTH ONCE DAILY WITH BREAKFAST TO CONTROL BLOOD SUGAR  . metFORMIN (GLUCOPHAGE) 500 MG tablet TAKE 1 TABLET TWICE DAILY TO CONTROL GLUCOSE  . metoprolol tartrate (LOPRESSOR) 25 MG tablet TAKE 1 TABLET BY MOUTH TWICE A DAY TO CONTROL BLOOD  PRESSURE  . Multiple Vitamins-Minerals (MULTIVITAMIN PO) Take 1 tablet by mouth daily.  . potassium chloride SA (K-DUR,KLOR-CON) 20 MEQ tablet Take 1 tablet (20 mEq total) by mouth daily.  Marland Kitchen spironolactone (ALDACTONE) 25 MG tablet TAKE 1 TABLET BY MOUTH EVERY DAY  . traMADol (ULTRAM) 50 MG tablet Take 25 mg every 6 (six) hours as needed by mouth for moderate pain.  Marland Kitchen warfarin (COUMADIN) 5 MG tablet Take 5 mg by mouth as directed. 5mg  daily every day except on Fridays; 2.5mg  on Fridays only  . Wound Dressings (KERLIX SUPER SPONGE SALINE) PADS Apply 1 each daily topically.   No facility-administered encounter medications on file as of 10/28/2017.     Review of Systems  Constitutional: Negative for activity change, chills, fatigue and fever.  Respiratory: Negative for cough, chest tightness, shortness of breath and wheezing.   Cardiovascular: Positive for leg swelling. Negative for chest pain and palpitations.  Gastrointestinal: Negative for abdominal distention, abdominal pain, constipation, diarrhea, nausea and vomiting.  Musculoskeletal: Positive for gait problem.  Skin: Negative for color change, pallor and rash.       Bilateral arm skin tear and leg ulcers.   Neurological: Negative for dizziness, syncope, numbness and headaches.  Hematological: Does not bruise/bleed easily.  Psychiatric/Behavioral: Negative for agitation, confusion and sleep disturbance. The patient is not nervous/anxious.     Immunization History  Administered Date(s) Administered  . Influenza Whole 09/02/2012, 09/05/2013  . Influenza, High Dose Seasonal PF 09/11/2017  . Influenza-Unspecified 09/16/2014, 09/01/2015, 09/21/2016  . Pneumococcal Polysaccharide-23 12/03/2002  . Td 12/03/2005  . Tdap 10/13/2017   Pertinent  Health Maintenance Due  Topic Date Due  . PNA vac Low Risk Adult (2 of 2 - PCV13) 12/04/2003  . FOOT EXAM  12/03/2017 (Originally 11/17/2015)  . MAMMOGRAM  12/03/2017 (Originally 10/13/1940)  .  OPHTHALMOLOGY EXAM  12/03/2017 (Originally 10/13/1934)  . DEXA SCAN  12/03/2017 (Originally 10/13/1989)  . HEMOGLOBIN A1C  04/20/2018  . URINE MICROALBUMIN  07/15/2018  . INFLUENZA VACCINE  Completed   Fall Risk  03/12/2017 04/03/2016 03/20/2016 11/16/2014 07/07/2013  Falls in the past year? No No No No No     Vitals:   10/28/17 1047  BP: 100/60  Pulse: 88  Resp: 18  Temp: (!) 96.6 F (35.9 C)  SpO2: 98%  Weight: 120 lb 8 oz (54.7 kg)  Height: 5\' 2"  (1.575 m)   Body mass index is 22.04 kg/m. Physical Exam  Constitutional: She is oriented to person, place, and time.  Frail elderly in no acute distress  HENT:  Head: Normocephalic.  Mouth/Throat: Oropharynx is clear and moist. No oropharyngeal exudate.  Eyes: Conjunctivae and EOM are normal. Pupils are equal, round, and reactive to light. Right eye exhibits no discharge. Left eye exhibits no discharge. No scleral icterus.  Neck: Normal range of motion. No JVD present. No  thyromegaly present.  Cardiovascular: Normal rate, regular rhythm, normal heart sounds and intact distal pulses. Exam reveals no gallop and no friction rub.  No murmur heard. Pulmonary/Chest: Effort normal and breath sounds normal. No respiratory distress. She has no wheezes.  Abdominal: Soft. Bowel sounds are normal. She exhibits no distension. There is no tenderness. There is no rebound.  Musculoskeletal: She exhibits no tenderness or deformity.  Moves x 4 extremities. Bilateral lower extremities 2-3+ edema with serous drainage weeping.  Lymphadenopathy:    She has no cervical adenopathy.  Neurological: She is oriented to person, place, and time. Coordination normal.  Skin: Skin is warm and dry. No rash noted. No erythema. No pallor.  1. Right hand dorsal skin tear wound bed with yellow skin tissue with small amounts of drainage noted. Surrounding skin tissue with slight redness.   2. Left hand skin tear wound bed with small amounts yellow drainage.Arm redness  noted.   3. Bilateral lower extremities ulcer wound bed red. Surrounding skin tissue maceration and redness noted. Moderate amounts of serous drainage noted on dressing and patient's pants.   Psychiatric: She has a normal mood and affect.    Labs reviewed: Recent Labs    03/04/17 0750 07/15/17 0750 08/22/17 0740 10/21/17  NA 138 140 140 137  K 4.3 4.3 4.1 4.4  CL 98 102 102  --   CO2 30 27 30   --   GLUCOSE 162* 135* 134*  --   BUN 45* 34* 34* 45*  CREATININE 1.53* 1.29* 1.21* 1.41  CALCIUM 9.1 8.5* 8.5* 7.8   Recent Labs    03/04/17 0750 07/15/17 0750 08/22/17 0740 10/21/17  AST 19 17 17 17   ALT 12 11 9 12   ALKPHOS 56 51  --  56  BILITOT 0.8 0.5 0.4 0.6  PROT 6.0* 5.0* 5.3* 4.4  ALBUMIN 3.5* 2.9*  --  2.3   Recent Labs    08/22/17 0740 10/21/17  WBC 8.4 7.6  NEUTROABS 5,149  --   HGB 11.5* 10.2  HCT 36.3 31.7  MCV 85.2  --   PLT 217  --    Lab Results  Component Value Date   TSH 3.23 11/02/2013   Lab Results  Component Value Date   HGBA1C 7.3 (H) 07/15/2017   Lab Results  Component Value Date   CHOL 111 07/15/2017   HDL 42 (L) 07/15/2017   LDLCALC 46 07/15/2017   TRIG 113 07/15/2017   CHOLHDL 2.6 07/15/2017    Significant Diagnostic Results in last 30 days:  Ct Head Wo Contrast  Result Date: 10/13/2017 CLINICAL DATA:  Facial trauma post fall. EXAM: CT HEAD WITHOUT CONTRAST CT MAXILLOFACIAL WITHOUT CONTRAST CT CERVICAL SPINE WITHOUT CONTRAST TECHNIQUE: Multidetector CT imaging of the head, cervical spine, and maxillofacial structures were performed using the standard protocol without intravenous contrast. Multiplanar CT image reconstructions of the cervical spine and maxillofacial structures were also generated. COMPARISON:  Head CT 12/04/2007 FINDINGS: CT HEAD FINDINGS Brain: No evidence of acute infarction, hemorrhage, hydrocephalus, extra-axial collection or mass lesion/mass effect. Moderate to advanced brain parenchymal atrophy and periventricular  microangiopathy. Vascular: No hyperdense vessel or unexpected calcification. Skull: Normal. Negative for fracture or focal lesion. Other: Left frontal scalp hematoma. CT MAXILLOFACIAL FINDINGS Osseous: Mildly comminuted left nasal fracture with possible extension to the anterior septum. Orbits: No evidence of orbital fractures. Sinuses: Minimal fluid level within the right maxillary sinus, the remainder of the paranasal sinuses are well aerated. Soft tissues: Left frontal and preseptal periorbital hematoma.  The postseptal elements of the left orbit are intact. CT CERVICAL SPINE FINDINGS Alignment: Normal. Skull base and vertebrae: No acute fracture. No primary bone lesion or focal pathologic process. Soft tissues and spinal canal: No prevertebral fluid or swelling. No visible canal hematoma. Disc levels:  Multilevel osteoarthritic changes, moderate to severe. Upper chest: Bilateral full effusions. Probable interstitial pulmonary edema. Calcified pleuroparenchymal scarring in the bilateral apices. Other: None. IMPRESSION: No acute intracranial abnormality. Atrophy, chronic microvascular disease. No evidence of acute traumatic injury to cervical spine. Mildly comminuted left nasal fracture with possible extension to the anterior septum. Left frontal and preseptal periorbital hematoma. The postseptal elements of the left orbit are intact. Electronically Signed   By: Fidela Salisbury M.D.   On: 10/13/2017 16:54   Ct Cervical Spine Wo Contrast  Result Date: 10/13/2017 CLINICAL DATA:  Facial trauma post fall. EXAM: CT HEAD WITHOUT CONTRAST CT MAXILLOFACIAL WITHOUT CONTRAST CT CERVICAL SPINE WITHOUT CONTRAST TECHNIQUE: Multidetector CT imaging of the head, cervical spine, and maxillofacial structures were performed using the standard protocol without intravenous contrast. Multiplanar CT image reconstructions of the cervical spine and maxillofacial structures were also generated. COMPARISON:  Head CT 12/04/2007  FINDINGS: CT HEAD FINDINGS Brain: No evidence of acute infarction, hemorrhage, hydrocephalus, extra-axial collection or mass lesion/mass effect. Moderate to advanced brain parenchymal atrophy and periventricular microangiopathy. Vascular: No hyperdense vessel or unexpected calcification. Skull: Normal. Negative for fracture or focal lesion. Other: Left frontal scalp hematoma. CT MAXILLOFACIAL FINDINGS Osseous: Mildly comminuted left nasal fracture with possible extension to the anterior septum. Orbits: No evidence of orbital fractures. Sinuses: Minimal fluid level within the right maxillary sinus, the remainder of the paranasal sinuses are well aerated. Soft tissues: Left frontal and preseptal periorbital hematoma. The postseptal elements of the left orbit are intact. CT CERVICAL SPINE FINDINGS Alignment: Normal. Skull base and vertebrae: No acute fracture. No primary bone lesion or focal pathologic process. Soft tissues and spinal canal: No prevertebral fluid or swelling. No visible canal hematoma. Disc levels:  Multilevel osteoarthritic changes, moderate to severe. Upper chest: Bilateral full effusions. Probable interstitial pulmonary edema. Calcified pleuroparenchymal scarring in the bilateral apices. Other: None. IMPRESSION: No acute intracranial abnormality. Atrophy, chronic microvascular disease. No evidence of acute traumatic injury to cervical spine. Mildly comminuted left nasal fracture with possible extension to the anterior septum. Left frontal and preseptal periorbital hematoma. The postseptal elements of the left orbit are intact. Electronically Signed   By: Fidela Salisbury M.D.   On: 10/13/2017 16:54   Dg Hand Complete Right  Result Date: 10/13/2017 CLINICAL DATA:  Fall EXAM: RIGHT HAND - COMPLETE 3+ VIEW COMPARISON:  None. FINDINGS: No evidence of fracture of the carpal or metacarpal bones. Radiocarpal joint is intact. Phalanges are normal. No soft tissue injury. IMPRESSION: No fracture or  dislocation. Electronically Signed   By: Suzy Bouchard M.D.   On: 10/13/2017 15:48   Ct Maxillofacial Wo Contrast  Result Date: 10/13/2017 CLINICAL DATA:  Facial trauma post fall. EXAM: CT HEAD WITHOUT CONTRAST CT MAXILLOFACIAL WITHOUT CONTRAST CT CERVICAL SPINE WITHOUT CONTRAST TECHNIQUE: Multidetector CT imaging of the head, cervical spine, and maxillofacial structures were performed using the standard protocol without intravenous contrast. Multiplanar CT image reconstructions of the cervical spine and maxillofacial structures were also generated. COMPARISON:  Head CT 12/04/2007 FINDINGS: CT HEAD FINDINGS Brain: No evidence of acute infarction, hemorrhage, hydrocephalus, extra-axial collection or mass lesion/mass effect. Moderate to advanced brain parenchymal atrophy and periventricular microangiopathy. Vascular: No hyperdense vessel or unexpected calcification. Skull:  Normal. Negative for fracture or focal lesion. Other: Left frontal scalp hematoma. CT MAXILLOFACIAL FINDINGS Osseous: Mildly comminuted left nasal fracture with possible extension to the anterior septum. Orbits: No evidence of orbital fractures. Sinuses: Minimal fluid level within the right maxillary sinus, the remainder of the paranasal sinuses are well aerated. Soft tissues: Left frontal and preseptal periorbital hematoma. The postseptal elements of the left orbit are intact. CT CERVICAL SPINE FINDINGS Alignment: Normal. Skull base and vertebrae: No acute fracture. No primary bone lesion or focal pathologic process. Soft tissues and spinal canal: No prevertebral fluid or swelling. No visible canal hematoma. Disc levels:  Multilevel osteoarthritic changes, moderate to severe. Upper chest: Bilateral full effusions. Probable interstitial pulmonary edema. Calcified pleuroparenchymal scarring in the bilateral apices. Other: None. IMPRESSION: No acute intracranial abnormality. Atrophy, chronic microvascular disease. No evidence of acute  traumatic injury to cervical spine. Mildly comminuted left nasal fracture with possible extension to the anterior septum. Left frontal and preseptal periorbital hematoma. The postseptal elements of the left orbit are intact. Electronically Signed   By: Fidela Salisbury M.D.   On: 10/13/2017 16:54   Assessment/Plan 1. Cellulitis of leg, except foot Afebrile. Bilateral lower extremities ulcer wound bed red.Surrounding skin tissue maceration and redness noted.Moderate amounts of serous drainage noted on dressing and patient's pants.cleanse ulcer with saline,pat dry, cover with alginate dressing and secure with Kerlix. Please wrap Kerlix from toes to below knee and change dressing every other day.    2. Right/left hand Afebrile.Right/left hand dorsal skin tear wound bed with yellow skin tissue with small amounts of drainage noted.Surrounding skin tissue with redness.Left Arm redness also noted.will start on doxycycline 100 mg tablet twice daily x 7 days. Florastor 250 mg capsule one by mouth twice daily x 10 days.cleanse skin tear on both hands with saline pat dry,cover with calcium alginate for absorption and nonadherent gauze.     3. Long term current use of anticoagulant Nurse reports INR check reading error request INR to be checked via venipuncture.INR results was 3.1 will hold coumadin today.Recheck INR daily while on doxycycline.    Family/ staff Communication: Reviewed plan of care with patient and facility Nurse  Labs/tests ordered: None   Nelda Bucks Ngetich, NP

## 2017-10-29 DIAGNOSIS — R2681 Unsteadiness on feet: Secondary | ICD-10-CM | POA: Diagnosis not present

## 2017-10-29 DIAGNOSIS — M6281 Muscle weakness (generalized): Secondary | ICD-10-CM | POA: Diagnosis not present

## 2017-10-29 DIAGNOSIS — R1310 Dysphagia, unspecified: Secondary | ICD-10-CM | POA: Diagnosis not present

## 2017-10-30 DIAGNOSIS — R2681 Unsteadiness on feet: Secondary | ICD-10-CM | POA: Diagnosis not present

## 2017-10-30 DIAGNOSIS — R1310 Dysphagia, unspecified: Secondary | ICD-10-CM | POA: Diagnosis not present

## 2017-10-30 DIAGNOSIS — M6281 Muscle weakness (generalized): Secondary | ICD-10-CM | POA: Diagnosis not present

## 2017-10-31 DIAGNOSIS — R2681 Unsteadiness on feet: Secondary | ICD-10-CM | POA: Diagnosis not present

## 2017-10-31 DIAGNOSIS — R1310 Dysphagia, unspecified: Secondary | ICD-10-CM | POA: Diagnosis not present

## 2017-10-31 DIAGNOSIS — M6281 Muscle weakness (generalized): Secondary | ICD-10-CM | POA: Diagnosis not present

## 2017-11-01 ENCOUNTER — Non-Acute Institutional Stay (SKILLED_NURSING_FACILITY): Payer: Medicare Other | Admitting: Family

## 2017-11-01 ENCOUNTER — Encounter: Payer: Self-pay | Admitting: Family

## 2017-11-01 DIAGNOSIS — R601 Generalized edema: Secondary | ICD-10-CM

## 2017-11-01 DIAGNOSIS — M6281 Muscle weakness (generalized): Secondary | ICD-10-CM | POA: Diagnosis not present

## 2017-11-01 DIAGNOSIS — L97921 Non-pressure chronic ulcer of unspecified part of left lower leg limited to breakdown of skin: Secondary | ICD-10-CM

## 2017-11-01 DIAGNOSIS — R2681 Unsteadiness on feet: Secondary | ICD-10-CM | POA: Diagnosis not present

## 2017-11-01 DIAGNOSIS — R1310 Dysphagia, unspecified: Secondary | ICD-10-CM | POA: Diagnosis not present

## 2017-11-01 DIAGNOSIS — L89152 Pressure ulcer of sacral region, stage 2: Secondary | ICD-10-CM | POA: Diagnosis not present

## 2017-11-01 NOTE — Progress Notes (Signed)
Location:  [facility] Room Number: 13 Place of Service:  SNF (31) Provider: Cloyce Paterson FNP-C  Blanchie Serve, MD  Patient Care Team: Blanchie Serve, MD as PCP - General (Internal Medicine) [place], Peter M, MD as Consulting Physician (Cardiology) [place], [place], Nelda Bucks, NP as Nurse Practitioner (Family Medicine)  Extended Emergency Contact Information Primary Emergency Contact: Allen,Dana Address: [address]          [place], [place] [address] [place] Mobile Phone: [phone] Relation: Daughter Secondary Emergency Contact: Edson Snowball [place] Phone: [phone] Relation: None  Code Status:  DNR Goals of care: Advanced Directive information Advanced Directives 11/01/2017  Does Patient Have a Medical Advance Directive? Yes  Type of Advance Directive Out of facility DNR (pink MOST or yellow form);[place];Living will  Does patient want to make changes to medical advance directive? -  Copy of [place] in Chart? Yes  Pre-existing out of facility DNR order (yellow form or pink MOST form) Yellow form placed in chart (order not valid for inpatient use)     Chief Complaint  Patient presents with  . Acute Visit    leg ulcers    HPI:  Pt is a 83 y.o. female seen today at [facility] for an acute visit for evaluation of leg ulcers and edema.She has a significant medical history of venous ulcer previous managed by wound care clinic prior to admission.Nurse reports bilateral lower extremities weeping.Patient states feels much today compared to previous days.she is currently on diuretic twice daily with parameters to hold if B/p<90/60.Overall Blood pressure readings soft low.Progressive weight loss noted possible due to diuresis: Wt 120.5 lbs (10/25/2017);Wt 119.8 lbs (10/28/2017); Wt 117.7 lbs (10/25/2017).She was seen by Registered Dietician will start on protein  supplements. She states was taking boost daily while in her apartment.     Past Medical History:  Diagnosis Date  . Atrial fibrillation ([place])    persistent afib/ atrial flutter  . Brain atrophy 2009  . Congestive heart failure (CHF) ([place])   . Eczema   . Edema   . Hyperlipidemia   . Hypertension   . Kidney disease, chronic, stage III (GFR 30-59 ml/min) (H[facility])   . Macular degeneration 2012   left   . Sick sinus syndrome ([place])    post DDD pacemaker  . Skin cancer of nose 2012   with skin graft  . Type II or unspecified type diabetes mellitus with renal manifestations, not stated as uncontrolled(250.40)    type 2  . Xerophthalmia 2012  . Xerostomia 2012   Past Surgical History:  Procedure Laterality Date  . ANKLE SURGERY Right 1990   fracture as steel plate   . CARDIAC VALVE SURGERY  2005   Dr. Ricard Dillon  . CATARACT EXTRACTION W/ INTRAOCULAR LENS IMPLANT Left 11/11/14   [place]  2008  . EYE SURGERY    . KNEE SURGERY Right 1990   fracture has plate and rod in femur  . PACEMAKER GENERATOR CHANGE  07/19/2014   Dr. Rayann Heman MDT  . PACEMAKER INSERTION  2005; 07-19-14   MDT ADDRL1 pacemaker generator change by Dr Rayann Heman [id number]  . PERMANENT PACEMAKER GENERATOR CHANGE N/A 07/19/2014   Procedure: PERMANENT PACEMAKER GENERATOR CHANGE;  Surgeon: Coralyn Mark, MD;  Location: [facility] CATH LAB;  Service: Cardiovascular;  Laterality: N/A;    Allergies  Allergen Reactions  . Penicillins Other (See Comments)  Temperature went up to 104 F  . Amiodarone   . Penicillin G Benzathine & Proc Other (See Comments)    Unknown  . Other Rash    MULTAG    Outpatient Encounter Medications as of 11/01/2017  Medication Sig  . atorvastatin (LIPITOR) 20 MG tablet Take 20 mg daily by mouth.  . calcium citrate (CALCITRATE - DOSED IN MG ELEMENTAL CALCIUM) 950 MG tablet Take 200 mg of elemental calcium by mouth daily.  Marland Kitchen doxycycline (VIBRA-TABS) 100 MG tablet Take 100 mg by mouth 2  (two) times daily.  . furosemide (LASIX) 40 MG tablet Take 1 tablet (40 mg total) by mouth 2 (two) times daily. Take one at 9AM and one at [facility] for [facility]F/Edema  . glimepiride (AMARYL) 2 MG tablet TAKE ONE TABLET BY MOUTH ONCE DAILY WITH BREAKFAST TO CONTROL BLOOD SUGAR  . metFORMIN (GLUCOPHAGE) 500 MG tablet TAKE 1 TABLET TWICE DAILY TO CONTROL GLUCOSE  . metoprolol tartrate (LOPRESSOR) 25 MG tablet TAKE 1 TABLET BY MOUTH TWICE A DAY TO CONTROL BLOOD PRESSURE  . Multiple Vitamins-Minerals (MULTIVITAMIN PO) Take 1 tablet by mouth daily.  . potassium chloride SA (K-DUR,KLOR-CON) 20 MEQ tablet Take 1 tablet (20 mEq total) by mouth daily.  Marland Kitchen saccharomyces boulardii (FLORASTOR) 250 MG capsule Take 250 mg by mouth 2 (two) times daily.  Marland Kitchen spironolactone (ALDACTONE) 25 MG tablet TAKE 1 TABLET BY MOUTH EVERY DAY  . traMADol (ULTRAM) 50 MG tablet Take 25 mg every 6 (six) hours as needed by mouth for moderate pain.  Marland Kitchen warfarin (COUMADIN) 5 MG tablet Take 5 mg by mouth as directed. 5mg  daily every day except on Fridays; 2.5mg  on Fridays only  . Wound Dressings (KERLIX SUPER SPONGE SALINE) PADS Apply 1 each daily topically.   No facility-administered encounter medications on file as of 11/01/2017.     Review of Systems  Constitutional: Negative for activity change, appetite change, chills, fatigue and fever.  HENT: Negative for congestion, rhinorrhea, sinus pressure, sinus pain, sneezing and sore throat.   Respiratory: Negative for cough, chest tightness, shortness of breath and wheezing.   Cardiovascular: Positive for leg swelling. Negative for chest pain and palpitations.  Gastrointestinal: Negative for abdominal distention, abdominal pain, constipation, diarrhea, nausea and vomiting.  Musculoskeletal: Positive for gait problem.  Skin: Negative for color change, pallor and rash.       Left leg ulcer, bilateral hand laceration  Neurological: Negative for dizziness, syncope, light-headedness and headaches.    Psychiatric/Behavioral: Negative for agitation, confusion and sleep disturbance. The patient is not nervous/anxious.     Immunization History  Administered Date(s) Administered  . Influenza Whole 09/02/2012, 09/05/2013  . Influenza, High Dose Seasonal PF 09/11/2017  . Influenza-Unspecified 09/16/2014, 09/01/2015, 09/21/2016  . Pneumococcal Polysaccharide-23 12/03/2002  . Td 12/03/2005  . Tdap 10/13/2017   Pertinent  Health Maintenance Due  Topic Date Due  . PNA vac Low Risk Adult (2 of 2 - PCV13) 12/04/2003  . FOOT EXAM  12/03/2017 (Originally 11/17/2015)  . MAMMOGRAM  12/03/2017 (Originally 10[birth year]13/1942)  . OPHTHALMOLOGY EXAM  12/03/2017 (Originally 10/13/1934)  . DEXA SCAN  12/03/2017 (Originally 10/13/1989)  . HEMOGLOBIN A1C  04/20/2018  . URINE MICROALBUMIN  07/15/2018  . INFLUENZA VACCINE  Completed   Fall Risk  03/12/2017 04/03/2016 03/20/2016 11/16/2014 07/07/2013  Falls in the past year? No No No No No    Vitals:   11/01/17 1052  BP: (!) 92/56  Pulse: 100  Resp: 11  Temp: (!) 96.9 F (36.1 C)  SpO2: 97%  Weight: 117 lb 11.2 oz (53.4 kg)  Height: 5\' 2"  (1.575 m)   Body mass index is 21.53 kg/m. Physical Exam  Constitutional: She is oriented to person, place, and time. She appears well-developed.  Frail elderly in no acute distress  HENT:  Head: Normocephalic.  Mouth/Throat: Oropharynx is clear and moist. No oropharyngeal exudate.  Eyes: Conjunctivae and EOM are normal. Pupils are equal, round, and reactive to light. Right eye exhibits no discharge. Left eye exhibits no discharge. No scleral icterus.  Neck: Normal range of motion. No JVD present. No thyromegaly present.  Cardiovascular: Normal rate, regular rhythm, normal heart sounds and intact distal pulses. Exam reveals no gallop and no friction rub.  No murmur heard. Pulmonary/Chest: Effort normal and breath sounds normal. No respiratory distress. She has no wheezes. She has no rales.  Abdominal: Soft. Bowel  sounds are normal. She exhibits no distension. There is no tenderness. There is no rebound and no guarding.  Musculoskeletal:  Unsteady gait uses FWW with assistance. Bilateral lower extremities 2-3+ edema.   Lymphadenopathy:    She has no cervical adenopathy.  Neurological: She is oriented to person, place, and time. Coordination normal.  Skin: Skin is warm and dry. No rash noted. No erythema.  1. Right hand dorsal skin tear wound bed red .No drainage noted. Surrounding skin tissue without any signs of infection.    2. Left hand skin tear wound bed with small amounts yellow  Tissue.No drainage or redness noted.   3. Bilateral lower extremities ulcer wound bed red. Surrounding skin tissue maceration.Moderate amounts of serous drainage noted on dressing.  4. Sacral stage 2 ulcer wound bed red surrounding skin tissue dark purple nonblachable areas. None tender to touch.No drainage noted.   Psychiatric: She has a normal mood and affect.   Labs reviewed: Recent Labs    03/04/17 0750 07/15/17 0750 08/22/17 0740 10/21/17  NA 138 140 140 137  K 4.3 4.3 4.1 4.4  CL 98 102 102  --   CO2 30 27 30   --   GLUCOSE 162* 135* 134*  --   BUN 45* 34* 34* 45*  CREATININE 1.53* 1.29* 1.21* 1.41  CALCIUM 9.1 8.5* 8.5* 7.8   Recent Labs    03/04/17 0750 07/15/17 0750 08/22/17 0740 10/21/17  AST 19 17 17 17   ALT 12 11 9 12   ALKPHOS 56 51  --  56  BILITOT 0.8 0.5 0.4 0.6  PROT 6.0* 5.0* 5.3* 4.4  ALBUMIN 3.5* 2.9*  --  2.3   Recent Labs    08/22/17 0740 10/21/17  WBC 8.4 7.6  NEUTROABS 5,149  --   HGB 11.5* 10.2  HCT 36.3 31.7  MCV 85.2  --   PLT 217  --    Lab Results  Component Value Date   TSH 3.23 11/02/2013   Lab Results  Component Value Date   HGBA1C 7.3 (H) 07/15/2017   Lab Results  Component Value Date   CHOL 111 07/15/2017   HDL 42 (L) 07/15/2017   LDLCALC 46 07/15/2017   TRIG 113 07/15/2017   CHOLHDL 2.6 07/15/2017    Significant Diagnostic Results in last 30  days:  Ct Head Wo Contrast  Result Date: 10/13/2017 CLINICAL DATA:  Facial trauma post fall. EXAM: CT HEAD WITHOUT CONTRAST CT MAXILLOFACIAL WITHOUT CONTRAST CT CERVICAL SPINE WITHOUT CONTRAST TECHNIQUE: Multidetector CT imaging of the head, cervical spine, and maxillofacial structures were performed using the standard protocol without intravenous contrast. Multiplanar CT image  reconstructions of the cervical spine and maxillofacial structures were also generated. COMPARISON:  Head CT 12/04/2007 FINDINGS: CT HEAD FINDINGS Brain: No evidence of acute infarction, hemorrhage, hydrocephalus, extra-axial collection or mass lesion/mass effect. Moderate to advanced brain parenchymal atrophy and periventricular microangiopathy. Vascular: No hyperdense vessel or unexpected calcification. Skull: Normal. Negative for fracture or focal lesion. Other: Left frontal scalp hematoma. CT MAXILLOFACIAL FINDINGS Osseous: Mildly comminuted left nasal fracture with possible extension to the anterior septum. Orbits: No evidence of orbital fractures. Sinuses: Minimal fluid level within the right maxillary sinus, the remainder of the paranasal sinuses are well aerated. Soft tissues: Left frontal and preseptal periorbital hematoma. The postseptal elements of the left orbit are intact. CT CERVICAL SPINE FINDINGS Alignment: Normal. Skull base and vertebrae: No acute fracture. No primary bone lesion or focal pathologic process. Soft tissues and spinal canal: No prevertebral fluid or swelling. No visible canal hematoma. Disc levels:  Multilevel osteoarthritic changes, moderate to severe. Upper chest: Bilateral full effusions. Probable interstitial pulmonary edema. Calcified pleuroparenchymal scarring in the bilateral apices. Other: None. IMPRESSION: No acute intracranial abnormality. Atrophy, chronic microvascular disease. No evidence of acute traumatic injury to cervical spine. Mildly comminuted left nasal fracture with possible extension  to the anterior septum. Left frontal and preseptal periorbital hematoma. The postseptal elements of the left orbit are intact. Electronically Signed   By: Fidela Salisbury M.D.   On: 10/13/2017 16:54   Ct Cervical Spine Wo Contrast  Result Date: 10/13/2017 CLINICAL DATA:  Facial trauma post fall. EXAM: CT HEAD WITHOUT CONTRAST CT MAXILLOFACIAL WITHOUT CONTRAST CT CERVICAL SPINE WITHOUT CONTRAST TECHNIQUE: Multidetector CT imaging of the head, cervical spine, and maxillofacial structures were performed using the standard protocol without intravenous contrast. Multiplanar CT image reconstructions of the cervical spine and maxillofacial structures were also generated. COMPARISON:  Head CT 12/04/2007 FINDINGS: CT HEAD FINDINGS Brain: No evidence of acute infarction, hemorrhage, hydrocephalus, extra-axial collection or mass lesion/mass effect. Moderate to advanced brain parenchymal atrophy and periventricular microangiopathy. Vascular: No hyperdense vessel or unexpected calcification. Skull: Normal. Negative for fracture or focal lesion. Other: Left frontal scalp hematoma. CT MAXILLOFACIAL FINDINGS Osseous: Mildly comminuted left nasal fracture with possible extension to the anterior septum. Orbits: No evidence of orbital fractures. Sinuses: Minimal fluid level within the right maxillary sinus, the remainder of the paranasal sinuses are well aerated. Soft tissues: Left frontal and preseptal periorbital hematoma. The postseptal elements of the left orbit are intact. CT CERVICAL SPINE FINDINGS Alignment: Normal. Skull base and vertebrae: No acute fracture. No primary bone lesion or focal pathologic process. Soft tissues and spinal canal: No prevertebral fluid or swelling. No visible canal hematoma. Disc levels:  Multilevel osteoarthritic changes, moderate to severe. Upper chest: Bilateral full effusions. Probable interstitial pulmonary edema. Calcified pleuroparenchymal scarring in the bilateral apices. Other:  None. IMPRESSION: No acute intracranial abnormality. Atrophy, chronic microvascular disease. No evidence of acute traumatic injury to cervical spine. Mildly comminuted left nasal fracture with possible extension to the anterior septum. Left frontal and preseptal periorbital hematoma. The postseptal elements of the left orbit are intact. Electronically Signed   By: Fidela Salisbury M.D.   On: 10/13/2017 16:54   Dg Hand Complete Right  Result Date: 10/13/2017 CLINICAL DATA:  Fall EXAM: RIGHT HAND - COMPLETE 3+ VIEW COMPARISON:  None. FINDINGS: No evidence of fracture of the carpal or metacarpal bones. Radiocarpal joint is intact. Phalanges are normal. No soft tissue injury. IMPRESSION: No fracture or dislocation. Electronically Signed   By: Helane Gunther.D.  On: 10/13/2017 15:48   Ct Maxillofacial Wo Contrast  Result Date: 10/13/2017 CLINICAL DATA:  Facial trauma post fall. EXAM: CT HEAD WITHOUT CONTRAST CT MAXILLOFACIAL WITHOUT CONTRAST CT CERVICAL SPINE WITHOUT CONTRAST TECHNIQUE: Multidetector CT imaging of the head, cervical spine, and maxillofacial structures were performed using the standard protocol without intravenous contrast. Multiplanar CT image reconstructions of the cervical spine and maxillofacial structures were also generated. COMPARISON:  Head CT 12/04/2007 FINDINGS: CT HEAD FINDINGS Brain: No evidence of acute infarction, hemorrhage, hydrocephalus, extra-axial collection or mass lesion/mass effect. Moderate to advanced brain parenchymal atrophy and periventricular microangiopathy. Vascular: No hyperdense vessel or unexpected calcification. Skull: Normal. Negative for fracture or focal lesion. Other: Left frontal scalp hematoma. CT MAXILLOFACIAL FINDINGS Osseous: Mildly comminuted left nasal fracture with possible extension to the anterior septum. Orbits: No evidence of orbital fractures. Sinuses: Minimal fluid level within the right maxillary sinus, the remainder of the paranasal  sinuses are well aerated. Soft tissues: Left frontal and preseptal periorbital hematoma. The postseptal elements of the left orbit are intact. CT CERVICAL SPINE FINDINGS Alignment: Normal. Skull base and vertebrae: No acute fracture. No primary bone lesion or focal pathologic process. Soft tissues and spinal canal: No prevertebral fluid or swelling. No visible canal hematoma. Disc levels:  Multilevel osteoarthritic changes, moderate to severe. Upper chest: Bilateral full effusions. Probable interstitial pulmonary edema. Calcified pleuroparenchymal scarring in the bilateral apices. Other: None. IMPRESSION: No acute intracranial abnormality. Atrophy, chronic microvascular disease. No evidence of acute traumatic injury to cervical spine. Mildly comminuted left nasal fracture with possible extension to the anterior septum. Left frontal and preseptal periorbital hematoma. The postseptal elements of the left orbit are intact. Electronically Signed   By: Fidela Salisbury M.D.   On: 10/13/2017 16:54   Assessment/Plan 1. Skin ulcer of lower leg  Afebrile.Hx of venous ulcer previously managed by wound care clinic.Continue current dressing changes every other day and as needed if soiled.Refer to wound care clinic for evaluation.continue protein supplements.    2. Generalized edema Wt 120.5 lbs (10/25/2017) Wt 119.8 lbs (10/28/2017) Wt 117.7 lbs (10/25/2017) Slight improvement with diuretics.No shortness of breath or cough.Lungs CTA. Continue on Furosemide 40 mg tablet twice daily and Aldactone 25 mg tablet. Elevate legs while seated. Continue to monitor weight.  3. Stage 2 sacral ulcer Small open areas to sacrum with Large purple nonblanchable area to sacral area. Cleanse open area with saline pat dry, apply hydrogel and cover with foam dressing for extra cushion.Protein supplement initiated by Registered Dietician.    Family/ staff Communication: Reviewed plan of care with patient and facility  Nurse  Labs/tests ordered: None   Nelda Bucks Murrel Bertram, NP

## 2017-11-04 ENCOUNTER — Non-Acute Institutional Stay (SKILLED_NURSING_FACILITY): Payer: Medicare Other | Admitting: Family

## 2017-11-04 DIAGNOSIS — I509 Heart failure, unspecified: Secondary | ICD-10-CM | POA: Diagnosis not present

## 2017-11-04 DIAGNOSIS — I359 Nonrheumatic aortic valve disorder, unspecified: Secondary | ICD-10-CM | POA: Diagnosis not present

## 2017-11-04 DIAGNOSIS — I739 Peripheral vascular disease, unspecified: Secondary | ICD-10-CM | POA: Diagnosis not present

## 2017-11-04 DIAGNOSIS — I482 Chronic atrial fibrillation, unspecified: Secondary | ICD-10-CM

## 2017-11-04 DIAGNOSIS — E1159 Type 2 diabetes mellitus with other circulatory complications: Secondary | ICD-10-CM | POA: Diagnosis not present

## 2017-11-04 DIAGNOSIS — Z9181 History of falling: Secondary | ICD-10-CM | POA: Diagnosis not present

## 2017-11-04 DIAGNOSIS — L209 Atopic dermatitis, unspecified: Secondary | ICD-10-CM | POA: Diagnosis not present

## 2017-11-04 DIAGNOSIS — H3550 Unspecified hereditary retinal dystrophy: Secondary | ICD-10-CM | POA: Diagnosis not present

## 2017-11-04 DIAGNOSIS — N990 Postprocedural (acute) (chronic) kidney failure: Secondary | ICD-10-CM | POA: Diagnosis not present

## 2017-11-04 DIAGNOSIS — I10 Essential (primary) hypertension: Secondary | ICD-10-CM | POA: Diagnosis not present

## 2017-11-04 DIAGNOSIS — N183 Chronic kidney disease, stage 3 (moderate): Secondary | ICD-10-CM | POA: Diagnosis not present

## 2017-11-04 DIAGNOSIS — R5381 Other malaise: Secondary | ICD-10-CM

## 2017-11-04 DIAGNOSIS — R638 Other symptoms and signs concerning food and fluid intake: Secondary | ICD-10-CM

## 2017-11-04 DIAGNOSIS — E785 Hyperlipidemia, unspecified: Secondary | ICD-10-CM | POA: Diagnosis not present

## 2017-11-04 DIAGNOSIS — I519 Heart disease, unspecified: Secondary | ICD-10-CM | POA: Diagnosis not present

## 2017-11-04 DIAGNOSIS — M6281 Muscle weakness (generalized): Secondary | ICD-10-CM | POA: Diagnosis not present

## 2017-11-04 DIAGNOSIS — L8990 Pressure ulcer of unspecified site, unspecified stage: Secondary | ICD-10-CM | POA: Diagnosis not present

## 2017-11-04 DIAGNOSIS — R54 Age-related physical debility: Secondary | ICD-10-CM | POA: Diagnosis not present

## 2017-11-04 DIAGNOSIS — D72829 Elevated white blood cell count, unspecified: Secondary | ICD-10-CM

## 2017-11-04 DIAGNOSIS — I4891 Unspecified atrial fibrillation: Secondary | ICD-10-CM | POA: Diagnosis not present

## 2017-11-04 DIAGNOSIS — I495 Sick sinus syndrome: Secondary | ICD-10-CM | POA: Diagnosis not present

## 2017-11-04 LAB — BASIC METABOLIC PANEL
BUN: 109 — AB (ref 4–21)
CREATININE: 2.6 — AB (ref 0.5–1.1)
Glucose: 170
POTASSIUM: 5.1 (ref 3.4–5.3)
Sodium: 132 — AB (ref 137–147)

## 2017-11-04 LAB — HEPATIC FUNCTION PANEL
ALT: 22 (ref 7–35)
AST: 32 (ref 13–35)
Alkaline Phosphatase: 78 (ref 25–125)
Bilirubin, Total: 0.8

## 2017-11-04 LAB — CBC AND DIFFERENTIAL
HCT: 32 — AB (ref 36–46)
Hemoglobin: 10.8 — AB (ref 12.0–16.0)
Platelets: 241 (ref 150–399)
WBC: 13.3

## 2017-11-04 NOTE — Progress Notes (Addendum)
Location:  [facility] Room Number: 13  Place of Service:  SNF (31) Provider: Dinah Ngetich FNP-C  Blanchie Serve, MD  Patient Care Team: Blanchie Serve, MD as PCP - General (Internal Medicine) [place], Peter M, MD as Consulting Physician (Cardiology) [place], [place], Nelda Bucks, NP as Nurse Practitioner (Family Medicine)  Extended Emergency Contact Information Primary Emergency Contact: Allen,Dana Address: [address]          [place], [place] [address] [place] Mobile Phone: [phone] Relation: Daughter Secondary Emergency Contact: Edson Snowball [place] Phone: [phone] Relation: None  Code Status:  DNR Goals of care: Advanced Directive information Advanced Directives 11/01/2017  Does Patient Have a Medical Advance Directive? Yes  Type of Advance Directive Out of facility DNR (pink MOST or yellow form);[place];Living will  Does patient want to make changes to medical advance directive? -  Copy of [place] in Chart? Yes  Pre-existing out of facility DNR order (yellow form or pink MOST form) Yellow form placed in chart (order not valid for inpatient use)     Chief Complaint  Patient presents with  . Acute Visit    abnormal lab result and family request Hospice service     HPI:  Pt is a 83 y.o. female seen today at [facility] for an acute visit for evaluation of abnormal lab results and family request for Hospice service. She has a significant medical history of HTN,CHF,Afib,PVD,Sick sinus syndrome,Type 2 DM 2,CKD stage 3 among other conditions.She is seen in her room today.Facility Nurse reports patient's POA request hospice service, discontinue warfarin,calcium,lipitor,MVI and Tramadol.Patient's Nurse states patient has had decreased oral intake and refuses to take pills.She refused to take her last dose of antibiotics prior to visit. Family request hospice  services.Labs showed WBC 13.3,Na 132,CR 2.63,BUN 109 (11/04/2017).she denies any fever,chills or cough.   Addendum 11/05/2017:  Spoke with patient's daughter Henriette Combs in presence of facility Nurse Thayer Headings regarding patient inability to complete oral dose of antibiotics. Option given for I.M antibiotics but patient's daughter states would prefer to stop antibiotics for now and continue on Hospice. She would also like to apply Ulna boots.Ulna boots ineffective with nonambulatory patient recommended 2-3 layer wrap instead.     Past Medical History:  Diagnosis Date  . Atrial fibrillation ([place])    persistent afib/ atrial flutter  . Brain atrophy 2009  . Congestive heart failure (CHF) ([place])   . Eczema   . Edema   . Hyperlipidemia   . Hypertension   . Kidney disease, chronic, stage III (GFR 30-59 ml/min) (HCC)   . Macular degeneration 2012   left   . Sick sinus syndrome ([place])    post DDD pacemaker  . Skin cancer of nose 2012   with skin graft  . Type II or unspecified type diabetes mellitus with renal manifestations, not stated as uncontrolled(250.40)    type 2  . Xerophthalmia 2012  . Xerostomia 2012   Past Surgical History:  Procedure Laterality Date  . ANKLE SURGERY Right 1990   fracture as steel plate   . CARDIAC VALVE SURGERY  2005   Dr. Ricard Dillon  . CATARACT EXTRACTION W/ INTRAOCULAR LENS IMPLANT Left 11/11/14   [place]  2008  . EYE SURGERY    . KNEE SURGERY Right 1990   fracture has plate and rod in femur  . PACEMAKER GENERATOR CHANGE  07/19/2014   Dr.  Allred MDT  . PACEMAKER INSERTION  2005; 07-19-14   MDT ADDRL1 pacemaker generator change by Dr Rayann Heman [phone]  . PERMANENT PACEMAKER GENERATOR CHANGE N/A 07/19/2014   Procedure: PERMANENT PACEMAKER GENERATOR CHANGE;  Surgeon: Coralyn Mark, MD;  Location: [facility] CATH LAB;  Service: Cardiovascular;  Laterality: N/A;    Allergies  Allergen Reactions  . Penicillins Other (See Comments)    Temperature went up  to 104 F  . Amiodarone   . Penicillin G Benzathine & Proc Other (See Comments)    Unknown  . Other Rash    MULTAG    Outpatient Encounter Medications as of 11/04/2017  Medication Sig  . atorvastatin (LIPITOR) 20 MG tablet Take 20 mg daily by mouth.  . calcium citrate (CALCITRATE - DOSED IN MG ELEMENTAL CALCIUM) 950 MG tablet Take 200 mg of elemental calcium by mouth daily.  Marland Kitchen doxycycline (VIBRA-TABS) 100 MG tablet Take 100 mg by mouth 2 (two) times daily.  . furosemide (LASIX) 40 MG tablet Take 1 tablet (40 mg total) by mouth 2 (two) times daily. Take one at 9AM and one at [facility] for CHF/Edema  . glimepiride (AMARYL) 2 MG tablet TAKE ONE TABLET BY MOUTH ONCE DAILY WITH BREAKFAST TO CONTROL BLOOD SUGAR  . metFORMIN (GLUCOPHAGE) 500 MG tablet TAKE 1 TABLET TWICE DAILY TO CONTROL GLUCOSE  . metoprolol tartrate (LOPRESSOR) 25 MG tablet TAKE 1 TABLET BY MOUTH TWICE A DAY TO CONTROL BLOOD PRESSURE  . Multiple Vitamins-Minerals (MULTIVITAMIN PO) Take 1 tablet by mouth daily.  . potassium chloride SA (K-DUR,KLOR-CON) 20 MEQ tablet Take 1 tablet (20 mEq total) by mouth daily.  Marland Kitchen saccharomyces boulardii (FLORASTOR) 250 MG capsule Take 250 mg by mouth 2 (two) times daily.  Marland Kitchen spironolactone (ALDACTONE) 25 MG tablet TAKE 1 TABLET BY MOUTH EVERY DAY  . traMADol (ULTRAM) 50 MG tablet Take 25 mg every 6 (six) hours as needed by mouth for moderate pain.  Marland Kitchen warfarin (COUMADIN) 5 MG tablet Take 5 mg by mouth as directed. 5mg  daily every day except on Fridays; 2.5mg  on Fridays only  . Wound Dressings (KERLIX SUPER SPONGE SALINE) PADS Apply 1 each daily topically.   No facility-administered encounter medications on file as of 11/04/2017.     Review of Systems  Constitutional: Positive for activity change and appetite change. Negative for chills and fever.  HENT: Negative for congestion, rhinorrhea, sinus pressure, sinus pain, sneezing and sore throat.   Eyes: Negative for discharge, redness and visual  disturbance.  Respiratory: Negative for cough, chest tightness, shortness of breath and wheezing.   Cardiovascular: Positive for leg swelling. Negative for chest pain and palpitations.  Gastrointestinal: Negative for abdominal distention, abdominal pain, constipation, diarrhea, nausea and vomiting.  Endocrine: Negative for cold intolerance, heat intolerance, polydipsia, polyphagia and polyuria.  Genitourinary: Negative for flank pain, frequency and urgency.  Musculoskeletal: Positive for gait problem.  Skin: Positive for wound. Negative for color change, pallor and rash.  Neurological: Negative for dizziness, syncope, light-headedness and headaches.  Hematological: Does not bruise/bleed easily.  Psychiatric/Behavioral: Negative for agitation and sleep disturbance. The patient is not nervous/anxious.     Immunization History  Administered Date(s) Administered  . Influenza Whole 09/02/2012, 09/05/2013  . Influenza, High Dose Seasonal PF 09/11/2017  . Influenza-Unspecified 09/16/2014, 09/01/2015, 09/21/2016  . Pneumococcal Polysaccharide-23 12/03/2002  . Td 12/03/2005  . Tdap 10/13/2017   Pertinent  Health Maintenance Due  Topic Date Due  . PNA vac Low Risk Adult (2 of 2 - PCV13) 12/04/2003  .  FOOT EXAM  12/03/2017 (Originally 11/17/2015)  . MAMMOGRAM  12/03/2017 (Originally 10[birth year]13/1942)  . OPHTHALMOLOGY EXAM  12/03/2017 (Originally 10/13/1934)  . DEXA SCAN  12/03/2017 (Originally 10/13/1989)  . HEMOGLOBIN A1C  04/20/2018  . URINE MICROALBUMIN  07/15/2018  . INFLUENZA VACCINE  Completed   Fall Risk  03/12/2017 04/03/2016 03/20/2016 11/16/2014 07/07/2013  Falls in the past year? No No No No No    Vitals:   11/04/17 1726  BP: (!) 80/50  Pulse: (!) 102  Resp: 11  Temp: 97.9 F (36.6 C)  SpO2: 97%  Weight: 117 lb (53.1 kg)  Height: 5\' 2"  (1.575 m)   Body mass index is 21.4 kg/m. Physical Exam  Constitutional:  Frail elderly in no acute distress   HENT:  Head: Normocephalic.    Mouth/Throat: Oropharynx is clear and moist. No oropharyngeal exudate.  Eyes: Conjunctivae and EOM are normal. Pupils are equal, round, and reactive to light. Right eye exhibits no discharge. Left eye exhibits no discharge. No scleral icterus.  Neck: Neck supple. No thyromegaly present.  Cardiovascular: Exam reveals no gallop and no friction rub.  Irregular heart rate  Pulmonary/Chest: Effort normal and breath sounds normal. No respiratory distress. She has no wheezes. She has no rales.  Abdominal: Soft. Bowel sounds are normal. She exhibits no distension. There is no tenderness. There is no rebound and no guarding.  Musculoskeletal: She exhibits no tenderness.  Moves x 4 extremities.generalized weakness.Bilateral lower extremities edema  Lymphadenopathy:    She has no cervical adenopathy.  Neurological: Coordination normal.  Alert and oriented to person and place but disoriented to and time.   Skin: Skin is warm and dry. No rash noted. No erythema.  Bilateral lower extremities ulcers weeping serous drainage. Bilateral dorsal of hands previous laceration site progressive healing.Hand,leg and sacral DRSG managed by facility Nurse. Previous purple bruises on the face progressive healing.   Psychiatric: She has a normal mood and affect.   Labs reviewed: Recent Labs    03/04/17 0750 07/15/17 0750 08/22/17 0740 10/21/17 11/04/17  NA 138 140 140 137 132*  K 4.3 4.3 4.1 4.4 5.1  CL 98 102 102  --   --   CO2 30 27 30   --   --   GLUCOSE 162* 135* 134*  --   --   BUN 45* 34* 34* 45* 109*  CREATININE 1.53* 1.29* 1.21* 1.41 2.6*  CALCIUM 9.1 8.5* 8.5* 7.8  --    Recent Labs    03/04/17 0750 07/15/17 0750 08/22/17 0740 10/21/17 11/04/17  AST 19 17 17 17  32  ALT 12 11 9 12 22   ALKPHOS 56 51  --  56 78  BILITOT 0.8 0.5 0.4 0.6  --   PROT 6.0* 5.0* 5.3* 4.4  --   ALBUMIN 3.5* 2.9*  --  2.3  --    Recent Labs    08/22/17 0740 10/21/17 11/04/17  WBC 8.4 7.6 13.3  NEUTROABS 5,149  --    --   HGB 11.5* 10.2 10.8*  HCT 36.3 31.7 32*  MCV 85.2  --   --   PLT 217  --  241   Lab Results  Component Value Date   TSH 3.23 11/02/2013   Lab Results  Component Value Date   HGBA1C 7.3 (H) 07/15/2017   Lab Results  Component Value Date   CHOL 111 07/15/2017   HDL 42 (L) 07/15/2017   LDLCALC 46 07/15/2017   TRIG 113 07/15/2017   CHOLHDL 2.6 07/15/2017  Significant Diagnostic Results in last 30 days:  Ct Head Wo Contrast  Result Date: 10/13/2017 CLINICAL DATA:  Facial trauma post fall. EXAM: CT HEAD WITHOUT CONTRAST CT MAXILLOFACIAL WITHOUT CONTRAST CT CERVICAL SPINE WITHOUT CONTRAST TECHNIQUE: Multidetector CT imaging of the head, cervical spine, and maxillofacial structures were performed using the standard protocol without intravenous contrast. Multiplanar CT image reconstructions of the cervical spine and maxillofacial structures were also generated. COMPARISON:  Head CT 12/04/2007 FINDINGS: CT HEAD FINDINGS Brain: No evidence of acute infarction, hemorrhage, hydrocephalus, extra-axial collection or mass lesion/mass effect. Moderate to advanced brain parenchymal atrophy and periventricular microangiopathy. Vascular: No hyperdense vessel or unexpected calcification. Skull: Normal. Negative for fracture or focal lesion. Other: Left frontal scalp hematoma. CT MAXILLOFACIAL FINDINGS Osseous: Mildly comminuted left nasal fracture with possible extension to the anterior septum. Orbits: No evidence of orbital fractures. Sinuses: Minimal fluid level within the right maxillary sinus, the remainder of the paranasal sinuses are well aerated. Soft tissues: Left frontal and preseptal periorbital hematoma. The postseptal elements of the left orbit are intact. CT CERVICAL SPINE FINDINGS Alignment: Normal. Skull base and vertebrae: No acute fracture. No primary bone lesion or focal pathologic process. Soft tissues and spinal canal: No prevertebral fluid or swelling. No visible canal hematoma.  Disc levels:  Multilevel osteoarthritic changes, moderate to severe. Upper chest: Bilateral full effusions. Probable interstitial pulmonary edema. Calcified pleuroparenchymal scarring in the bilateral apices. Other: None. IMPRESSION: No acute intracranial abnormality. Atrophy, chronic microvascular disease. No evidence of acute traumatic injury to cervical spine. Mildly comminuted left nasal fracture with possible extension to the anterior septum. Left frontal and preseptal periorbital hematoma. The postseptal elements of the left orbit are intact. Electronically Signed   By: Fidela Salisbury M.D.   On: 10/13/2017 16:54   Ct Cervical Spine Wo Contrast  Result Date: 10/13/2017 CLINICAL DATA:  Facial trauma post fall. EXAM: CT HEAD WITHOUT CONTRAST CT MAXILLOFACIAL WITHOUT CONTRAST CT CERVICAL SPINE WITHOUT CONTRAST TECHNIQUE: Multidetector CT imaging of the head, cervical spine, and maxillofacial structures were performed using the standard protocol without intravenous contrast. Multiplanar CT image reconstructions of the cervical spine and maxillofacial structures were also generated. COMPARISON:  Head CT 12/04/2007 FINDINGS: CT HEAD FINDINGS Brain: No evidence of acute infarction, hemorrhage, hydrocephalus, extra-axial collection or mass lesion/mass effect. Moderate to advanced brain parenchymal atrophy and periventricular microangiopathy. Vascular: No hyperdense vessel or unexpected calcification. Skull: Normal. Negative for fracture or focal lesion. Other: Left frontal scalp hematoma. CT MAXILLOFACIAL FINDINGS Osseous: Mildly comminuted left nasal fracture with possible extension to the anterior septum. Orbits: No evidence of orbital fractures. Sinuses: Minimal fluid level within the right maxillary sinus, the remainder of the paranasal sinuses are well aerated. Soft tissues: Left frontal and preseptal periorbital hematoma. The postseptal elements of the left orbit are intact. CT CERVICAL SPINE FINDINGS  Alignment: Normal. Skull base and vertebrae: No acute fracture. No primary bone lesion or focal pathologic process. Soft tissues and spinal canal: No prevertebral fluid or swelling. No visible canal hematoma. Disc levels:  Multilevel osteoarthritic changes, moderate to severe. Upper chest: Bilateral full effusions. Probable interstitial pulmonary edema. Calcified pleuroparenchymal scarring in the bilateral apices. Other: None. IMPRESSION: No acute intracranial abnormality. Atrophy, chronic microvascular disease. No evidence of acute traumatic injury to cervical spine. Mildly comminuted left nasal fracture with possible extension to the anterior septum. Left frontal and preseptal periorbital hematoma. The postseptal elements of the left orbit are intact. Electronically Signed   By: Fidela Salisbury M.D.   On: 10/13/2017  16:54   Dg Hand Complete Right  Result Date: 10/13/2017 CLINICAL DATA:  Fall EXAM: RIGHT HAND - COMPLETE 3+ VIEW COMPARISON:  None. FINDINGS: No evidence of fracture of the carpal or metacarpal bones. Radiocarpal joint is intact. Phalanges are normal. No soft tissue injury. IMPRESSION: No fracture or dislocation. Electronically Signed   By: Suzy Bouchard M.D.   On: 10/13/2017 15:48   Ct Maxillofacial Wo Contrast  Result Date: 10/13/2017 CLINICAL DATA:  Facial trauma post fall. EXAM: CT HEAD WITHOUT CONTRAST CT MAXILLOFACIAL WITHOUT CONTRAST CT CERVICAL SPINE WITHOUT CONTRAST TECHNIQUE: Multidetector CT imaging of the head, cervical spine, and maxillofacial structures were performed using the standard protocol without intravenous contrast. Multiplanar CT image reconstructions of the cervical spine and maxillofacial structures were also generated. COMPARISON:  Head CT 12/04/2007 FINDINGS: CT HEAD FINDINGS Brain: No evidence of acute infarction, hemorrhage, hydrocephalus, extra-axial collection or mass lesion/mass effect. Moderate to advanced brain parenchymal atrophy and periventricular  microangiopathy. Vascular: No hyperdense vessel or unexpected calcification. Skull: Normal. Negative for fracture or focal lesion. Other: Left frontal scalp hematoma. CT MAXILLOFACIAL FINDINGS Osseous: Mildly comminuted left nasal fracture with possible extension to the anterior septum. Orbits: No evidence of orbital fractures. Sinuses: Minimal fluid level within the right maxillary sinus, the remainder of the paranasal sinuses are well aerated. Soft tissues: Left frontal and preseptal periorbital hematoma. The postseptal elements of the left orbit are intact. CT CERVICAL SPINE FINDINGS Alignment: Normal. Skull base and vertebrae: No acute fracture. No primary bone lesion or focal pathologic process. Soft tissues and spinal canal: No prevertebral fluid or swelling. No visible canal hematoma. Disc levels:  Multilevel osteoarthritic changes, moderate to severe. Upper chest: Bilateral full effusions. Probable interstitial pulmonary edema. Calcified pleuroparenchymal scarring in the bilateral apices. Other: None. IMPRESSION: No acute intracranial abnormality. Atrophy, chronic microvascular disease. No evidence of acute traumatic injury to cervical spine. Mildly comminuted left nasal fracture with possible extension to the anterior septum. Left frontal and preseptal periorbital hematoma. The postseptal elements of the left orbit are intact. Electronically Signed   By: Fidela Salisbury M.D.   On: 10/13/2017 16:54    Assessment/Plan 1. Chronic congestive heart failure No abrupt weight gain, shortness of breath, cough, wheezing or rales.currently on Furosemide 40 mg tablet twice daily.Lab results CR 2.63 discussed with Dr. Bubba Camp patient's lab results recommend d/c furosemide then initiate torsemide. D/c Furosemide 40 mg tablet then start Torsemide 40 mg tablet twice daily at 9 Am and 2 PM hold for systolic blood pressure < 90 or HR < 60 b/min.   2. Physical deconditioning Has had progressive decline in condition  with generalized weakness.Decreased oral and and fluid intake.Family request Hospice service for comfort care.   3. Decreased oral intake Has had poor oral and fluid intake. BUN 109,CR 2.63. Continue to encourage fluid intake.Hospice consult for comfort care per POA. Continue to encourage hydration and oral intake.continue protein supplements.   4. AFib  Discontinue warfarin per POA request due to decline in condition.Start ASA 81 mg tablet daily.Hopsice service for comfort care.  5. Hyperlipidemia  Discontinue atorvastatin 20 mg tablet per POA request for comfort care.  6. Hypotension   Possible due to dehydration and poor oral intake.Hospice consult for comfort care.   7. Leukocytosis  WBC 13.3 (11/04/2017).on Doxycycline 100 mg tablet twice daily but unable to complete course due to inability to swallow pills.Spoke with patient's daughter Henriette Combs in presence of facility Nurse Thayer Headings regarding patient inability to complete oral dose of antibiotics.  Option given for I.M antibiotics but patient's daughter states would prefer to stop antibiotics for now and continue on Hospice. She would also like to apply Ulna boots.Ulna boots ineffective with nonambulatory patient recommended 2-3 layer wrap instead.    Family/ staff Communication: Reviewed plan of care with Dr.Pandey,patient and facility Nurse.  Labs/tests ordered: None   Dinah C Ngetich, NP

## 2017-11-05 DIAGNOSIS — I4891 Unspecified atrial fibrillation: Secondary | ICD-10-CM | POA: Diagnosis not present

## 2017-11-05 DIAGNOSIS — I509 Heart failure, unspecified: Secondary | ICD-10-CM | POA: Diagnosis not present

## 2017-11-05 DIAGNOSIS — I739 Peripheral vascular disease, unspecified: Secondary | ICD-10-CM | POA: Diagnosis not present

## 2017-11-05 DIAGNOSIS — I519 Heart disease, unspecified: Secondary | ICD-10-CM | POA: Diagnosis not present

## 2017-11-05 DIAGNOSIS — I495 Sick sinus syndrome: Secondary | ICD-10-CM | POA: Diagnosis not present

## 2017-11-05 DIAGNOSIS — I359 Nonrheumatic aortic valve disorder, unspecified: Secondary | ICD-10-CM | POA: Diagnosis not present

## 2017-11-07 ENCOUNTER — Telehealth: Payer: Self-pay | Admitting: Cardiology

## 2017-11-07 ENCOUNTER — Encounter: Payer: Medicare Other | Admitting: *Deleted

## 2017-11-07 NOTE — Telephone Encounter (Signed)
LMOVM reminding pt to send remote transmission.   

## 2017-11-08 ENCOUNTER — Encounter: Payer: Self-pay | Admitting: Cardiology

## 2017-11-08 NOTE — Progress Notes (Signed)
Letter  

## 2017-11-20 ENCOUNTER — Encounter: Payer: Self-pay | Admitting: Internal Medicine

## 2017-12-03 DEATH — deceased

## 2017-12-13 ENCOUNTER — Encounter: Payer: Medicare Other | Admitting: Nurse Practitioner

## 2017-12-22 ENCOUNTER — Other Ambulatory Visit: Payer: Self-pay | Admitting: Internal Medicine

## 2017-12-22 DIAGNOSIS — IMO0002 Reserved for concepts with insufficient information to code with codable children: Secondary | ICD-10-CM

## 2017-12-22 DIAGNOSIS — E1165 Type 2 diabetes mellitus with hyperglycemia: Principal | ICD-10-CM

## 2017-12-22 DIAGNOSIS — N182 Chronic kidney disease, stage 2 (mild): Principal | ICD-10-CM

## 2017-12-22 DIAGNOSIS — E1122 Type 2 diabetes mellitus with diabetic chronic kidney disease: Secondary | ICD-10-CM

## 2018-10-16 IMAGING — DX DG HAND COMPLETE 3+V*R*
3 series · 3 of 3 positions shown · non-contrast
Comparison: None.

CLINICAL DATA: Fall

EXAM:
RIGHT HAND - COMPLETE 3+ VIEW

[x hand pa right]
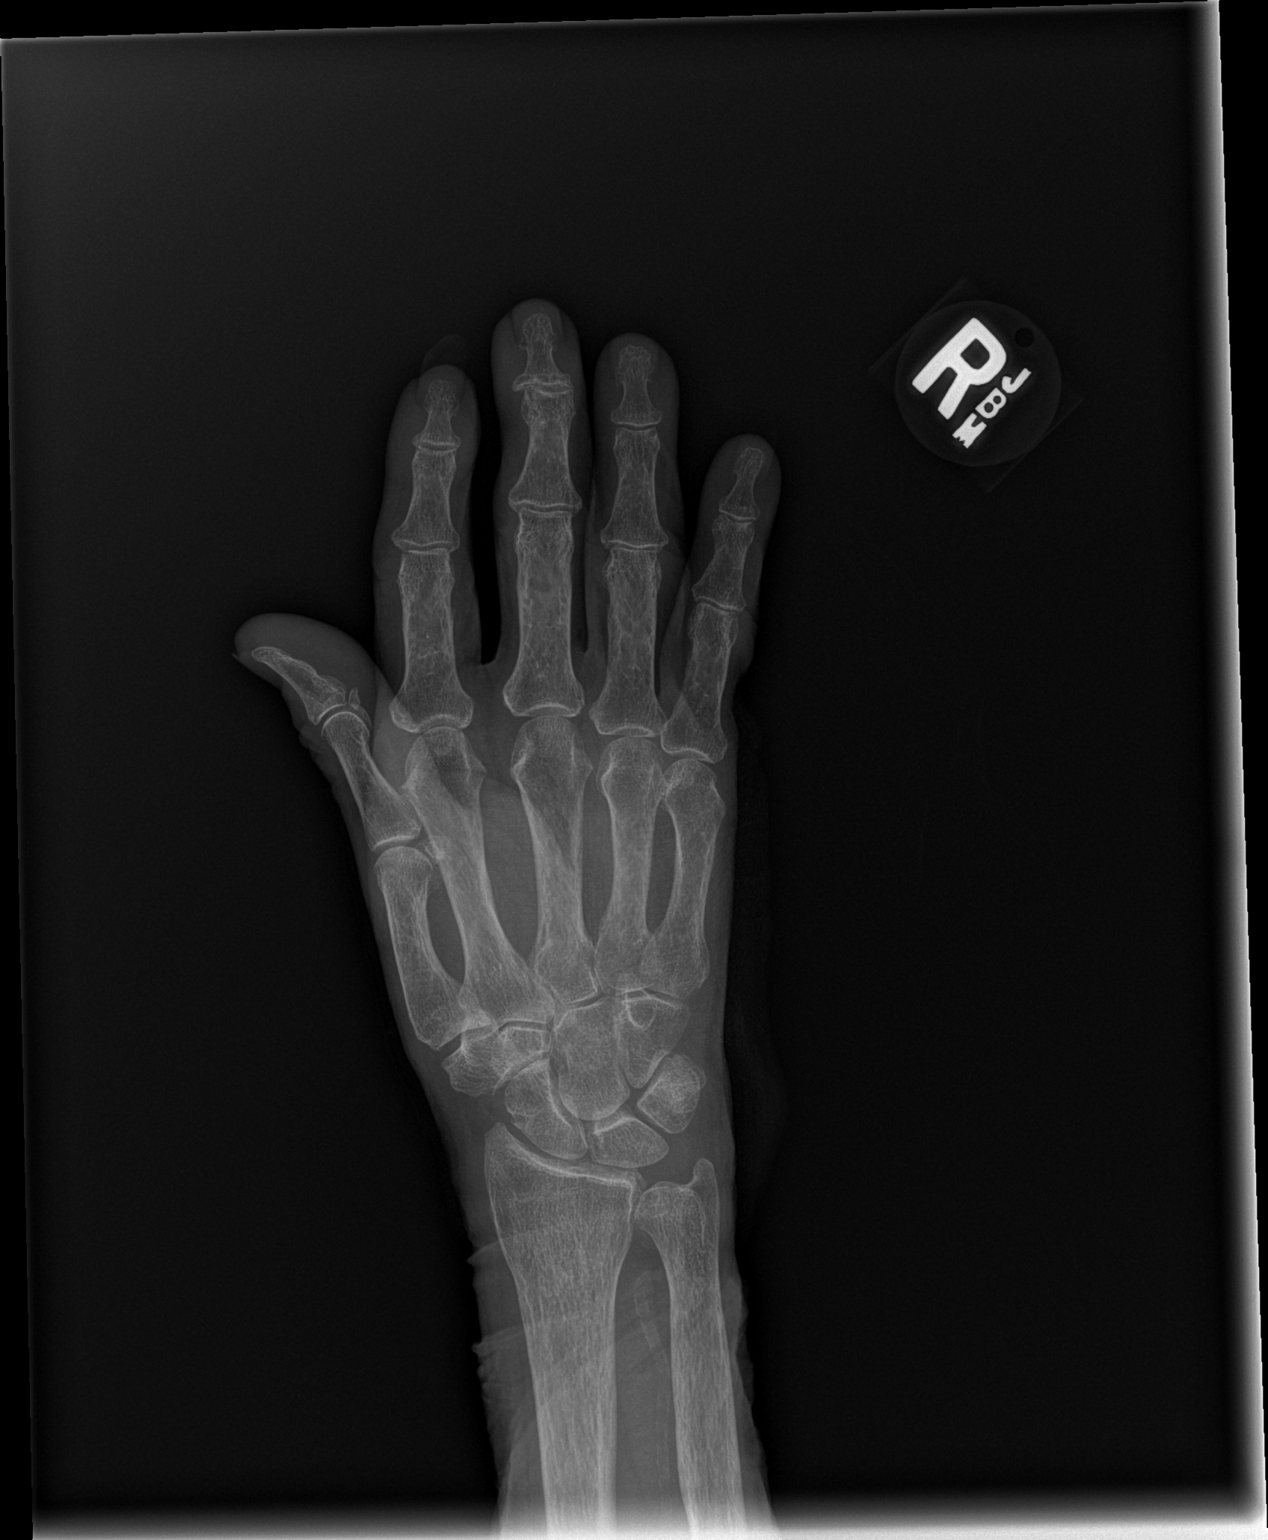

[x hand obl right]
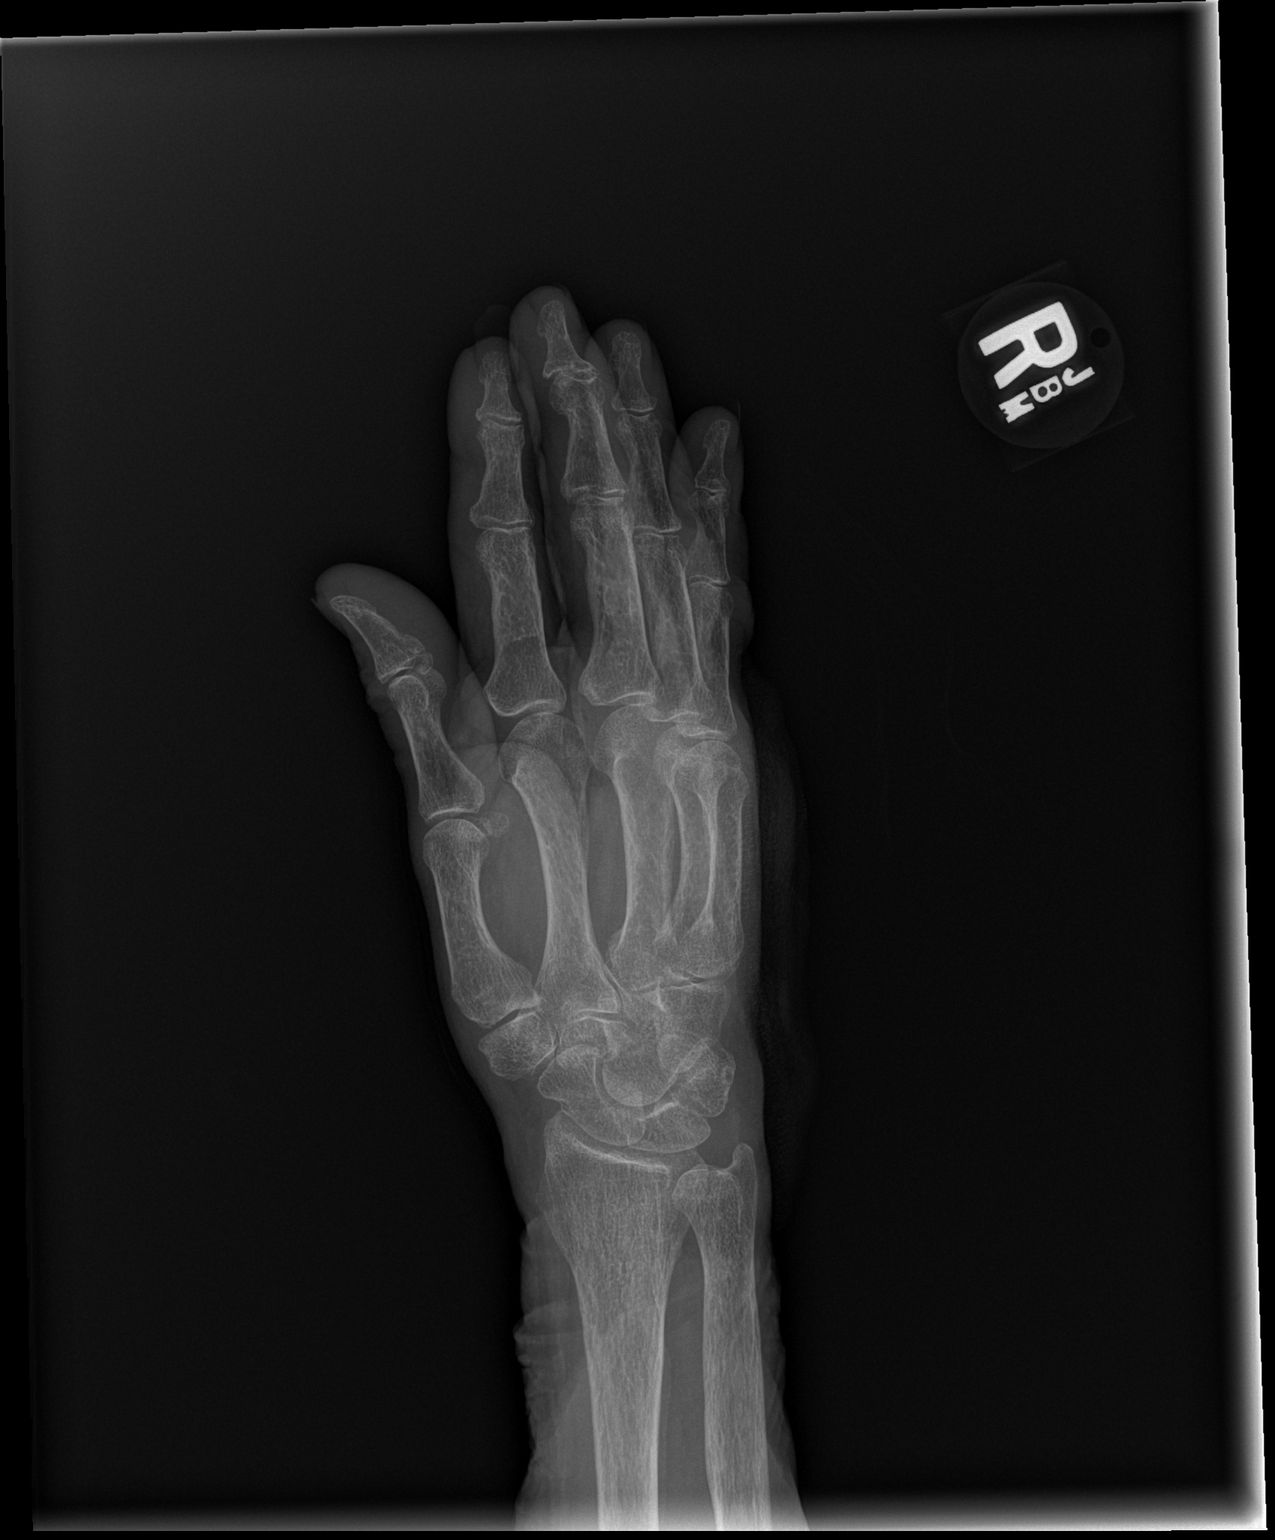

[x hand lat right]
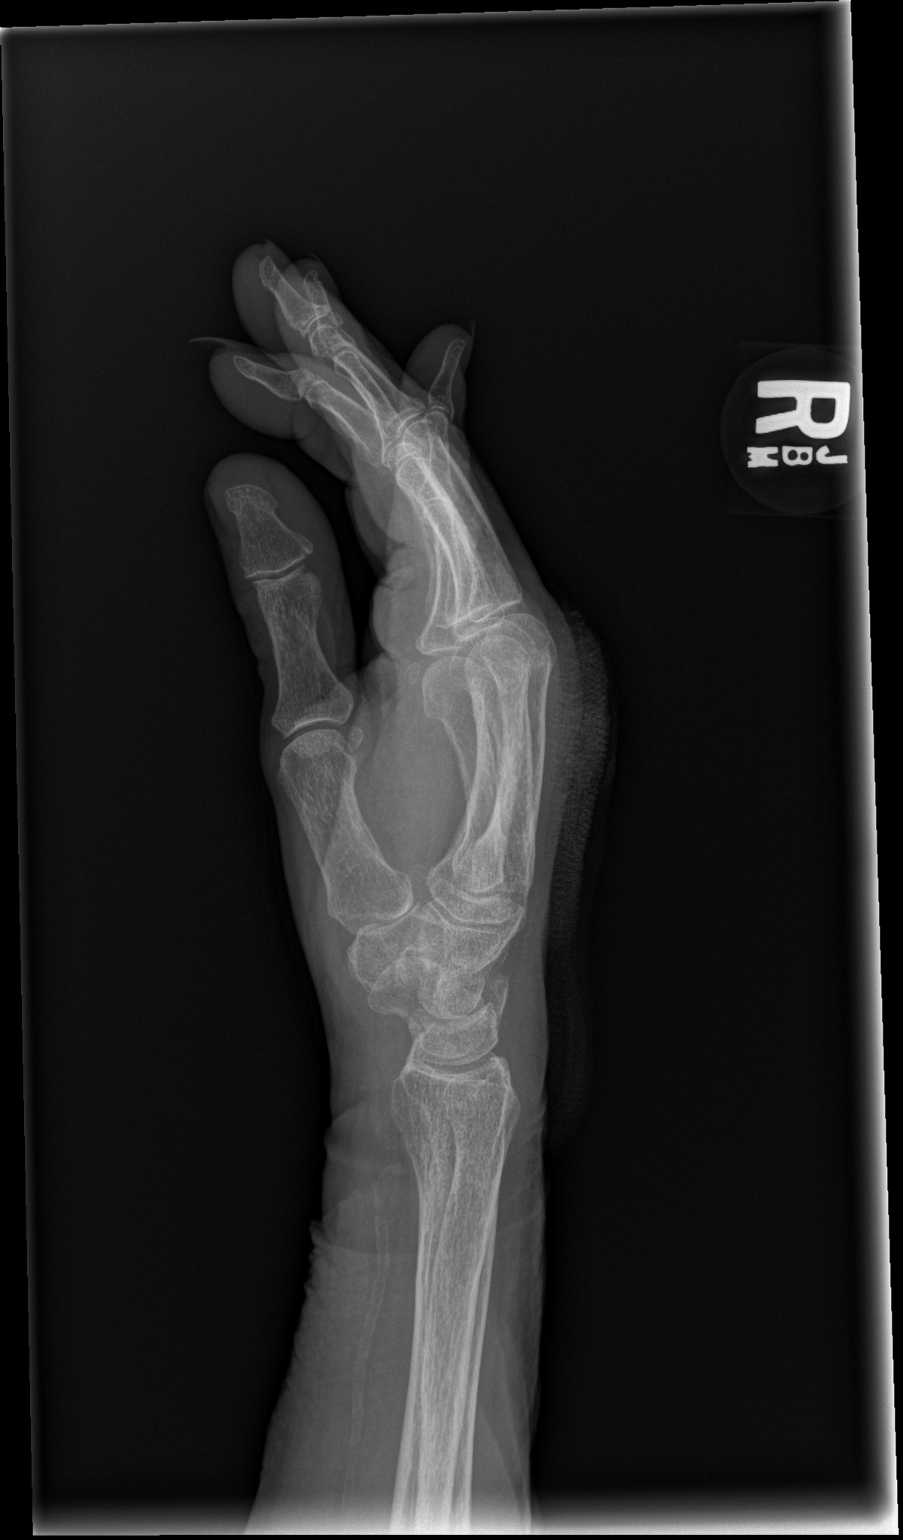

[3 of 3 positions shown; findings below may reference images not displayed]

FINDINGS: No evidence of fracture of the carpal or metacarpal bones.
Radiocarpal joint is intact. Phalanges are normal. No soft tissue
injury.
IMPRESSION: No fracture or dislocation.
